# Patient Record
Sex: Female | Born: 1960
Health system: Southern US, Community
[De-identification: ages and names within clinical notes are randomized; demographics above are authoritative.]

## PROBLEM LIST (undated history)

## (undated) DIAGNOSIS — F329 Major depressive disorder, single episode, unspecified: Secondary | ICD-10-CM

## (undated) DIAGNOSIS — B182 Chronic viral hepatitis C: Secondary | ICD-10-CM

## (undated) DIAGNOSIS — I10 Essential (primary) hypertension: Secondary | ICD-10-CM

## (undated) DIAGNOSIS — D649 Anemia, unspecified: Secondary | ICD-10-CM

## (undated) DIAGNOSIS — F32A Depression, unspecified: Secondary | ICD-10-CM

## (undated) DIAGNOSIS — N938 Other specified abnormal uterine and vaginal bleeding: Secondary | ICD-10-CM

## (undated) DIAGNOSIS — IMO0001 Reserved for inherently not codable concepts without codable children: Secondary | ICD-10-CM

## (undated) DIAGNOSIS — J14 Pneumonia due to Hemophilus influenzae: Secondary | ICD-10-CM

## (undated) DIAGNOSIS — Z8489 Family history of other specified conditions: Secondary | ICD-10-CM

## (undated) DIAGNOSIS — J449 Chronic obstructive pulmonary disease, unspecified: Secondary | ICD-10-CM

## (undated) DIAGNOSIS — F419 Anxiety disorder, unspecified: Secondary | ICD-10-CM

## (undated) HISTORY — DX: Pneumonia due to hemophilus influenzae: J14

## (undated) HISTORY — PX: POLYPECTOMY: SHX149

---

## 2004-08-20 ENCOUNTER — Emergency Department (HOSPITAL_COMMUNITY): Admission: EM | Admit: 2004-08-20 | Discharge: 2004-08-20 | Payer: Self-pay

## 2005-01-08 ENCOUNTER — Ambulatory Visit (HOSPITAL_COMMUNITY): Admission: RE | Admit: 2005-01-08 | Discharge: 2005-01-08 | Payer: Self-pay | Admitting: Internal Medicine

## 2005-01-09 ENCOUNTER — Encounter: Admission: RE | Admit: 2005-01-09 | Discharge: 2005-01-09 | Payer: Self-pay | Admitting: Internal Medicine

## 2005-06-14 ENCOUNTER — Encounter (INDEPENDENT_AMBULATORY_CARE_PROVIDER_SITE_OTHER): Payer: Self-pay | Admitting: Specialist

## 2005-06-14 ENCOUNTER — Ambulatory Visit (HOSPITAL_COMMUNITY): Admission: RE | Admit: 2005-06-14 | Discharge: 2005-06-14 | Payer: Self-pay | Admitting: Obstetrics & Gynecology

## 2006-07-26 ENCOUNTER — Inpatient Hospital Stay (HOSPITAL_COMMUNITY): Admission: AD | Admit: 2006-07-26 | Discharge: 2006-07-26 | Payer: Self-pay | Admitting: Obstetrics

## 2006-10-11 ENCOUNTER — Emergency Department (HOSPITAL_COMMUNITY): Admission: EM | Admit: 2006-10-11 | Discharge: 2006-10-11 | Payer: Self-pay | Admitting: Family Medicine

## 2006-11-04 ENCOUNTER — Emergency Department (HOSPITAL_COMMUNITY): Admission: EM | Admit: 2006-11-04 | Discharge: 2006-11-04 | Payer: Self-pay | Admitting: Emergency Medicine

## 2007-01-30 ENCOUNTER — Emergency Department (HOSPITAL_COMMUNITY): Admission: EM | Admit: 2007-01-30 | Discharge: 2007-01-30 | Payer: Self-pay | Admitting: Family Medicine

## 2007-06-04 ENCOUNTER — Emergency Department (HOSPITAL_COMMUNITY): Admission: EM | Admit: 2007-06-04 | Discharge: 2007-06-04 | Payer: Self-pay | Admitting: Emergency Medicine

## 2007-09-29 ENCOUNTER — Emergency Department (HOSPITAL_COMMUNITY): Admission: EM | Admit: 2007-09-29 | Discharge: 2007-09-29 | Payer: Self-pay | Admitting: Emergency Medicine

## 2008-04-26 ENCOUNTER — Emergency Department (HOSPITAL_COMMUNITY): Admission: EM | Admit: 2008-04-26 | Discharge: 2008-04-26 | Payer: Self-pay | Admitting: Emergency Medicine

## 2008-04-30 ENCOUNTER — Emergency Department (HOSPITAL_COMMUNITY): Admission: EM | Admit: 2008-04-30 | Discharge: 2008-04-30 | Payer: Self-pay | Admitting: Emergency Medicine

## 2008-12-13 ENCOUNTER — Emergency Department (HOSPITAL_COMMUNITY): Admission: EM | Admit: 2008-12-13 | Discharge: 2008-12-13 | Payer: Self-pay | Admitting: Emergency Medicine

## 2010-10-06 ENCOUNTER — Emergency Department (HOSPITAL_COMMUNITY): Admission: EM | Admit: 2010-10-06 | Discharge: 2010-10-06 | Payer: Self-pay | Admitting: Emergency Medicine

## 2010-12-08 ENCOUNTER — Encounter: Payer: Self-pay | Admitting: Internal Medicine

## 2010-12-09 ENCOUNTER — Encounter: Payer: Self-pay | Admitting: Internal Medicine

## 2011-01-29 LAB — URINE MICROSCOPIC-ADD ON

## 2011-01-29 LAB — CBC
MCV: 91 fL (ref 78.0–100.0)
Platelets: 331 10*3/uL (ref 150–400)
RDW: 17.7 % — ABNORMAL HIGH (ref 11.5–15.5)
WBC: 6.1 10*3/uL (ref 4.0–10.5)

## 2011-01-29 LAB — POCT I-STAT, CHEM 8
BUN: 12 mg/dL (ref 6–23)
Chloride: 109 mEq/L (ref 96–112)
Creatinine, Ser: 0.8 mg/dL (ref 0.4–1.2)
Glucose, Bld: 136 mg/dL — ABNORMAL HIGH (ref 70–99)
Potassium: 3.5 mEq/L (ref 3.5–5.1)

## 2011-01-29 LAB — URINALYSIS, ROUTINE W REFLEX MICROSCOPIC
Ketones, ur: 15 mg/dL — AB
Nitrite: NEGATIVE
Urobilinogen, UA: 1 mg/dL (ref 0.0–1.0)
pH: 7 (ref 5.0–8.0)

## 2011-01-29 LAB — DIFFERENTIAL
Basophils Absolute: 0 10*3/uL (ref 0.0–0.1)
Eosinophils Relative: 0 % (ref 0–5)
Lymphocytes Relative: 11 % — ABNORMAL LOW (ref 12–46)
Neutro Abs: 5.1 10*3/uL (ref 1.7–7.7)

## 2011-04-05 NOTE — Op Note (Signed)
[name], Cynthia Martinez NO.:  [id number]   MEDICAL RECORD NO.:  [id number]          PATIENT TYPE:  AMB   LOCATION:  SDC                           FACILITY:  [facility]   PHYSICIAN:  Roseanna Rainbow, M.D.DATE OF BIRTH:  [birth year]   DATE OF PROCEDURE:  06/14/2005  DATE OF DISCHARGE:  06/14/2005                                 OPERATIVE REPORT   PREOPERATIVE DIAGNOSIS:  Abnormal uterine bleeding, rule out an endometrial  polyp versus hyperplasia.   POSTOPERATIVE DIAGNOSIS:  Abnormal uterine bleeding, rule out an endometrial  polyp versus hyperplasia.   PROCEDURE:  Dilatation and curettage, diagnostic hysteroscopy.   SURGEON:  Roseanna Rainbow, M.D.   ANESTHESIA:  Managed anesthesia care, paracervical block.   ESTIMATED BLOOD LOSS:  Minimal.   COMPLICATIONS:  None.   IV FLUIDS:  As per anesthesiology.   PROCEDURE:  The patient was taken to the operating room with an IV running.  She was then placed in the dorsal lithotomy position and prepped and draped  in usual sterile fashion. Sims retractors were placed into the patient's  vagina. The anterior lip of the cervix was infiltrated with 2 mL of 1%  lidocaine. The single-tooth tenaculum was then applied to this location. 4  mL of lidocaine were then injected at five and seven o'clock to produce a  paracervical block. The cervix was then dilated with [facility] dilators. The  hysteroscope was then introduced into the uterus and advanced to the uterine  fundus using glycine as the distending media. A survey of the intrauterine  cavity was remarkable for somewhat lush appearing endometrium on the  anterior wall. There no discrete lesions noted. The tubal ostia were  visualized bilaterally. The hysteroscope was then removed. A sharp curettage  was then performed. Uterus sounded to approximately 10 cm. The single-tooth  tenaculum was removed with minimal bleeding noted from the cervix. At the  close of the procedure  the instrument and pack counts were said to be  correct x2. The patient was taken to the PACU awake and in stable condition.       LAJ/MEDQ  D:  06/17/2005  T:  06/17/2005  Job:  [id number]

## 2011-08-15 LAB — URINE MICROSCOPIC-ADD ON

## 2011-08-15 LAB — DIFFERENTIAL
Band Neutrophils: 0
Blasts: 0
Lymphocytes Relative: 16
Myelocytes: 0
Neutrophils Relative %: 78 — ABNORMAL HIGH
Promyelocytes Absolute: 0

## 2011-08-15 LAB — CBC
MCHC: 30.3
MCV: 60.6 — ABNORMAL LOW
RDW: 27.1 — ABNORMAL HIGH

## 2011-08-15 LAB — URINALYSIS, ROUTINE W REFLEX MICROSCOPIC
Bilirubin Urine: NEGATIVE
Glucose, UA: NEGATIVE
Specific Gravity, Urine: 1.026
pH: 5.5

## 2011-08-15 LAB — TYPE AND SCREEN
ABO/RH(D): O POS
Antibody Screen: NEGATIVE

## 2011-08-15 LAB — WET PREP, GENITAL: Trich, Wet Prep: NONE SEEN

## 2011-08-15 LAB — POCT I-STAT, CHEM 8
Chloride: 108
Glucose, Bld: 141 — ABNORMAL HIGH
HCT: 29 — ABNORMAL LOW
Hemoglobin: 9.9 — ABNORMAL LOW
Potassium: 3.7
Sodium: 140

## 2011-08-15 LAB — GC/CHLAMYDIA PROBE AMP, GENITAL: GC Probe Amp, Genital: NEGATIVE

## 2011-08-15 LAB — FERRITIN: Ferritin: 3 — ABNORMAL LOW (ref 10–291)

## 2011-08-15 LAB — POCT PREGNANCY, URINE: Preg Test, Ur: NEGATIVE

## 2011-11-30 ENCOUNTER — Encounter (HOSPITAL_COMMUNITY): Payer: Self-pay | Admitting: *Deleted

## 2011-11-30 ENCOUNTER — Emergency Department (INDEPENDENT_AMBULATORY_CARE_PROVIDER_SITE_OTHER)
Admission: EM | Admit: 2011-11-30 | Discharge: 2011-11-30 | Disposition: A | Discharge status: Home / Self Care | Payer: Self-pay | Source: Home / Self Care | Attending: Family Medicine | Admitting: Family Medicine

## 2011-11-30 DIAGNOSIS — J069 Acute upper respiratory infection, unspecified: Secondary | ICD-10-CM

## 2011-11-30 MED ORDER — AZITHROMYCIN 250 MG PO TABS: ORAL_TABLET | ORAL | Status: AC

## 2011-11-30 MED ORDER — IPRATROPIUM BROMIDE 0.06 % NA SOLN: 2.0000 | Freq: Four times a day (QID) | NASAL | Status: DC

## 2011-11-30 NOTE — ED Notes (Signed)
Pt with onset of cough congestion onset x one week - cough productive

## 2011-11-30 NOTE — ED Provider Notes (Signed)
History     CSN: [id number]  Arrival date & time 11/30/11  0902   First MD Initiated Contact with Patient 11/30/11 0914      Chief Complaint  Patient presents with  . Cough  . Nasal Congestion    (Consider location/radiation/quality/duration/timing/severity/associated sxs/prior treatment) Patient is a 51 y.o. female presenting with cough. The history is provided by the patient.  Cough This is a new problem. The current episode started more than 1 week ago. The problem occurs constantly. The problem has not changed since onset.The cough is productive of sputum. Associated symptoms include ear congestion and rhinorrhea. Pertinent negatives include no sore throat. She is not a smoker.    History reviewed. No pertinent past medical history.  History reviewed. No pertinent past surgical history.  History reviewed. No pertinent family history.  History  Substance Use Topics  . Smoking status: Never Smoker   . Smokeless tobacco: Not on file  . Alcohol Use: No    OB History    Grav Para Term Preterm Abortions TAB SAB Ect Mult Living                  Review of Systems  Constitutional: Negative.   HENT: Positive for congestion, rhinorrhea and postnasal drip. Negative for sore throat.   Respiratory: Positive for cough.   Cardiovascular: Negative.   Gastrointestinal: Negative.     Allergies  Penicillins and Sulfa antibiotics  Home Medications   Current Outpatient Rx  Name Route Sig Dispense Refill  . GUAIFENESIN ER 600 MG PO TB12 Oral Take 1,200 mg by mouth 2 (two) times daily.    . AZITHROMYCIN 250 MG PO TABS  Take as directed on pack 6 each 0  . IPRATROPIUM BROMIDE 0.06 % NA SOLN Nasal Place 2 sprays into the nose 4 (four) times daily. 15 mL 12    BP 134/92  Pulse 98  Temp(Src) 97.1 F (36.2 C) (Oral)  Resp 18  SpO2 100%  Physical Exam  Nursing note and vitals reviewed. Constitutional: She is oriented to person, place, and time. She appears well-developed  and well-nourished.  HENT:  Head: Normocephalic.  Right Ear: External ear normal.  Left Ear: External ear normal.  Nose: Nose normal.  Mouth/Throat: Oropharynx is clear and moist.  Eyes: Conjunctivae are normal. Pupils are equal, round, and reactive to light.  Neck: Normal range of motion. Neck supple.  Cardiovascular: Normal rate, regular rhythm and normal heart sounds.   Pulmonary/Chest: Effort normal and breath sounds normal. No respiratory distress. She has no wheezes. She has no rales.  Lymphadenopathy:    She has no cervical adenopathy.  Neurological: She is alert and oriented to person, place, and time.  Skin: Skin is warm and dry.  Psychiatric: She has a normal mood and affect.    ED Course  Procedures (including critical care time)  Labs Reviewed - No data to display No results found.   1. URI (upper respiratory infection)       MDM         Barkley Bruns, MD 11/30/11 [phone]

## 2011-12-15 ENCOUNTER — Emergency Department (HOSPITAL_COMMUNITY): Payer: Self-pay

## 2011-12-15 ENCOUNTER — Encounter (HOSPITAL_COMMUNITY): Payer: Self-pay | Admitting: *Deleted

## 2011-12-15 ENCOUNTER — Inpatient Hospital Stay (HOSPITAL_COMMUNITY)
Admission: EM | Admit: 2011-12-15 | Discharge: 2011-12-19 | DRG: 809 | Disposition: A | Discharge status: Home / Self Care | Payer: Self-pay | Attending: Infectious Diseases | Admitting: Infectious Diseases

## 2011-12-15 DIAGNOSIS — I10 Essential (primary) hypertension: Secondary | ICD-10-CM | POA: Diagnosis present

## 2011-12-15 DIAGNOSIS — Z681 Body mass index (BMI) 19 or less, adult: Secondary | ICD-10-CM

## 2011-12-15 DIAGNOSIS — D61818 Other pancytopenia: Principal | ICD-10-CM | POA: Diagnosis present

## 2011-12-15 DIAGNOSIS — R634 Abnormal weight loss: Secondary | ICD-10-CM | POA: Diagnosis present

## 2011-12-15 DIAGNOSIS — Z21 Asymptomatic human immunodeficiency virus [HIV] infection status: Secondary | ICD-10-CM | POA: Diagnosis present

## 2011-12-15 DIAGNOSIS — Z79899 Other long term (current) drug therapy: Secondary | ICD-10-CM

## 2011-12-15 DIAGNOSIS — B1920 Unspecified viral hepatitis C without hepatic coma: Secondary | ICD-10-CM | POA: Diagnosis present

## 2011-12-15 DIAGNOSIS — E538 Deficiency of other specified B group vitamins: Secondary | ICD-10-CM | POA: Diagnosis present

## 2011-12-15 HISTORY — DX: Anemia, unspecified: D64.9

## 2011-12-15 HISTORY — DX: Other specified abnormal uterine and vaginal bleeding: N93.8

## 2011-12-15 LAB — URINALYSIS, ROUTINE W REFLEX MICROSCOPIC
Glucose, UA: NEGATIVE mg/dL
Ketones, ur: 15 mg/dL — AB
Leukocytes, UA: NEGATIVE
pH: 6 (ref 5.0–8.0)

## 2011-12-15 LAB — COMPREHENSIVE METABOLIC PANEL
ALT: 46 U/L — ABNORMAL HIGH (ref 0–35)
Albumin: 4.2 g/dL (ref 3.5–5.2)
Alkaline Phosphatase: 31 U/L — ABNORMAL LOW (ref 39–117)
BUN: 15 mg/dL (ref 6–23)
Potassium: 3.5 mEq/L (ref 3.5–5.1)
Sodium: 139 mEq/L (ref 135–145)
Total Protein: 7.1 g/dL (ref 6.0–8.3)

## 2011-12-15 LAB — TECHNOLOGIST SMEAR REVIEW

## 2011-12-15 LAB — URINE MICROSCOPIC-ADD ON

## 2011-12-15 LAB — CBC
HCT: 19.8 % — ABNORMAL LOW (ref 36.0–46.0)
Hemoglobin: 6.7 g/dL — CL (ref 12.0–15.0)
MCHC: 33.8 g/dL (ref 30.0–36.0)
RBC: 1.95 MIL/uL — ABNORMAL LOW (ref 3.87–5.11)

## 2011-12-15 LAB — SAMPLE TO BLOOD BANK

## 2011-12-15 LAB — OCCULT BLOOD, POC DEVICE: Fecal Occult Bld: NEGATIVE

## 2011-12-15 LAB — PREPARE RBC (CROSSMATCH)

## 2011-12-15 LAB — RETICULOCYTES
RBC.: 1.78 MIL/uL — ABNORMAL LOW (ref 3.87–5.11)
Retic Count, Absolute: 16 10*3/uL — ABNORMAL LOW (ref 19.0–186.0)

## 2011-12-15 MED ORDER — ACETAMINOPHEN 325 MG PO TABS
650.0000 mg | ORAL_TABLET | Freq: Four times a day (QID) | ORAL | Status: DC | PRN
  Administered 2011-12-16 – 2011-12-18 (×3): 650 mg via ORAL
  Filled 2011-12-15 (×3): qty 2

## 2011-12-15 MED ORDER — ONDANSETRON HCL 4 MG/2ML IJ SOLN: 4.0000 mg | Freq: Four times a day (QID) | INTRAMUSCULAR | Status: DC | PRN

## 2011-12-15 MED ORDER — SODIUM CHLORIDE 0.9 % IV SOLN
INTRAVENOUS | Status: DC
  Administered 2011-12-15: 75 mL via INTRAVENOUS
  Administered 2011-12-16 – 2011-12-18 (×4): via INTRAVENOUS

## 2011-12-15 MED ORDER — ALBUTEROL SULFATE HFA 108 (90 BASE) MCG/ACT IN AERS
2.0000 | INHALATION_SPRAY | Freq: Four times a day (QID) | RESPIRATORY_TRACT | Status: DC | PRN
  Filled 2011-12-15: qty 6.7

## 2011-12-15 MED ORDER — ALBUTEROL SULFATE (5 MG/ML) 0.5% IN NEBU
5.0000 mg | INHALATION_SOLUTION | Freq: Once | RESPIRATORY_TRACT | Status: AC
  Administered 2011-12-15: 5 mg via RESPIRATORY_TRACT
  Filled 2011-12-15: qty 1

## 2011-12-15 MED ORDER — ONDANSETRON HCL 4 MG PO TABS: 4.0000 mg | ORAL_TABLET | Freq: Four times a day (QID) | ORAL | Status: DC | PRN

## 2011-12-15 MED ORDER — IPRATROPIUM BROMIDE 0.02 % IN SOLN
0.5000 mg | Freq: Once | RESPIRATORY_TRACT | Status: AC
  Administered 2011-12-15: 0.5 mg via RESPIRATORY_TRACT
  Filled 2011-12-15: qty 2.5

## 2011-12-15 MED ORDER — ACETAMINOPHEN 650 MG RE SUPP: 650.0000 mg | Freq: Four times a day (QID) | RECTAL | Status: DC | PRN

## 2011-12-15 MED ORDER — GUAIFENESIN ER 600 MG PO TB12
600.0000 mg | ORAL_TABLET | Freq: Two times a day (BID) | ORAL | Status: DC | PRN
  Filled 2011-12-15: qty 1

## 2011-12-15 MED ORDER — ENOXAPARIN SODIUM 40 MG/0.4ML ~~LOC~~ SOLN
40.0000 mg | SUBCUTANEOUS | Status: DC
  Administered 2011-12-15 – 2011-12-16 (×2): 40 mg via SUBCUTANEOUS
  Filled 2011-12-15 (×3): qty 0.4

## 2011-12-15 NOTE — ED Provider Notes (Signed)
3:13 PM Spoke with outpatient clinics resident. They will come see the patient for admission for pancytopenia. Discussed this with patient and Dr. Denton Lank  who agrees with  plan to have patient admitted for further evaluation of pancytopenia  Cynthia Martinez, Cordelia Poche 12/15/11 1514

## 2011-12-15 NOTE — ED Notes (Signed)
Ice applied at 1115 to pt's right hand d/t unsuccessful IV start - area was noted to be swollen and discolored - swelling is decreasing and discoloration improving.

## 2011-12-15 NOTE — ED Notes (Signed)
Tammy Sours, RT, aware of order for HHN's.

## 2011-12-15 NOTE — Progress Notes (Signed)
Pt was admitted from ED at 1645 with cough, afebrile, malaise and anemia. A & O x 4, full code. From home with family. Hgb was 6.7 on admit. Skin intact. Independent. POC is to transfuse PRBC with pt permission. Peter Congo RN

## 2011-12-15 NOTE — ED Provider Notes (Signed)
Medical screening examination/treatment/procedure(s) were conducted as a shared visit with non-physician practitioner(s) and myself.  I personally evaluated the patient during the encounter   Suzi Roots, MD 12/15/11 [phone]

## 2011-12-15 NOTE — ED Notes (Signed)
The pt has not been feeling well for 2-3 months.  She says she has had a cough  For the same amount of time.  She has weakness.  No appetite

## 2011-12-15 NOTE — ED Notes (Signed)
MD at bedside.ADM MD to see PT

## 2011-12-15 NOTE — H&P (Signed)
Date: 12/15/2011            Patient Name:  Cynthia Martinez  MRN: [id number]  DOB: Dec 06, 1960  Age / Sex: 51 y.o., female   PCP: Sheila Oats, MD, MD           Medical Service: Internal Medicine Teaching Service           Attending Physician: Dr. Lina Sayre, MD      Chief Complaint:dyspnea on exertion,  cough  History of Present Illness: Patient is a 51 y.o. female with a PMHx of iron deficiency anemia, who presents to [facility] for evaluation of several chronic symptoms that seem to be worsening in the last few weeks. She does not have regular primary care and has only been seen in the ED for minor complaints. She presents with worsened dyspnea on exertion such that she feels short of breath walking from her car to a store. She reports that yesterday she was unable to vacuum her house due to dyspnea and fatigue. She denies any falls or dizziness through she reports having a few episodes of bumping into the wall due to instability. She and her fiance report that she has had a cough and some dyspnea for several years. She reports having a recent upper respiratory infection since after Christmas and she associates a worsening in her symptoms since this time including loss of appetite and weight loss of uncertain amount. She was treated with a course of azithromycin as an outpatient with minimal improvement in her symptoms. She reports that she has a cough productive of yellow sputum. She has been prescribed an albuterol inhaler in the past and has been using her fiance's inhaler and reports that it helps her breathing when she uses it. She also reports an audible wheeze and mild headache. She denies a history of asthma or other respiratory problems. She has taken iron pills and vitamins intermittently in the past when diagnosed with anemia but she stops it when her blood count improves. She is post-menopausal as of summer 2012 and denies any bleeding or spotting since this time. She denies any  hematochezia or melena.  Past Medical History: Past Medical History  Diagnosis Date  . Anemia   . DUB (dysfunctional uterine bleeding)    Past Surgical History: History reviewed. No pertinent past surgical history.  Family History: Family History  Problem Relation Age of Onset  . Diabetes      Social History: History   Social History  . Marital Status: Single    Spouse Name: N/A    Number of Children: N/A  . Years of Education: N/A   Occupational History  . Not on file.   Social History Main Topics  . Smoking status: Current Some Day Smoker  . Smokeless tobacco: Not on file  . Alcohol Use: 0.0 oz/week     socially  . Drug Use: No  . Sexually Active: Yes -- Female partner(s)   Other Topics Concern  . Not on file   Social History Narrative   Lives with fiancee in [place] daughter in 20's and 32'3.Worked as Advertising copywriter in past.   Current Outpatient Medications:  Medications Prior to Admission  Medication Sig Dispense Refill  . guaiFENesin (MUCINEX) 600 MG 12 hr tablet Take 1,200 mg by mouth at bedtime.       Marland Kitchen ipratropium (ATROVENT) 0.06 % nasal spray Place 2 sprays into the nose 4 (four) times daily.  15 mL  12   Allergies: Allergies  Allergen  Reactions  . Penicillins Hives  . Sulfa Antibiotics Hives    Vital Signs: Blood pressure 121/81, pulse 114, temperature 97.4 F (36.3 C), temperature source Oral, resp. rate 20, SpO2 100.00%.  Physical Exam: General: thin middle aged woman resting in bed in no apparent distress HEENT: PERRL, EOMI, mild scleral icterus, pale conjunctiva Cardiac: tachycardic rate with regular rhythm, no rubs, murmurs or gallops Pulm: clear to auscultation bilaterally, moving normal volumes of air Abd: soft, nontender, nondistended, BS present Ext: warm and well perfused, no pedal edema Neuro: alert and oriented X3, cranial nerves II-XII grossly intact  Lab results: Basic Metabolic Panel: Recent Labs  [facility] 12/15/11 1112   NA  139   K 3.5   CL 105   CO2 25   GLUCOSE 89   BUN 15   CREATININE 0.78   CALCIUM 9.6   MG --   PHOS --   Liver Function Tests: Recent Labs  [facility] 12/15/11 1112   AST 91*   ALT 46*   ALKPHOS 31*   BILITOT 1.7*   PROT 7.1   ALBUMIN 4.2   No results found for this basename: LIPASE:2,AMYLASE:2 in the last 72 hours CBC: Recent Labs  Basename 12/15/11 1112   WBC 2.7*   NEUTROABS --   HGB 6.7*   HCT 19.8*   MCV 101.5*   PLT 88*   Imaging results:  Dg Chest 2 View  12/15/2011  *RADIOLOGY REPORT*  Clinical Data: Cough  CHEST - 2 VIEW  Comparison: None  Findings: There is a scoliosis deformity affecting the thoracic spine which is convex to the right.  The heart size and mediastinal contours are within normal limits. Both lungs are clear.  IMPRESSION: No acute findings.  Original Report Authenticated By: Rosealee Albee, M.D.   Assessment & Plan:  Pt is a 51 y.o. yo female with an unknown past medical history who presents with pancytopenia along with 15 lb weight loss and and chronic cough and worsened dyspnea on exertion.  1) Pancytopenia: Unknown etiology at present. May be from viral infection vs. bone marrow disease. Vitamin B 12 and folic deficiency in differential. Mildly elevated AST and ALT with mild elevation of bilirubin.  Plan: Admit to regular bed. - Anemia panel - HIV antibody - Acute hepatitis panel - Patient refuses blood transfusions unless absolutely necessary. - Bone marrow biopsy- if issue continues without any specific diagnosis with above tests.  2) Weight loss: For past few weeks. Might be related to the diagnosis of pancytopenia.  3) Chronic cough: Patient refuses reflux symptoms. Also does not have diagnosis of asthma. Plan: Continue when necessary albuterol and guaifenesin.  4) DVT prophylaxis: Lovenox.

## 2011-12-15 NOTE — ED Provider Notes (Signed)
History     CSN: [id number]  Arrival date & time 12/15/11  0930   First MD Initiated Contact with Patient 12/15/11 1041      Chief Complaint  Patient presents with  . Cough    (Consider location/radiation/quality/duration/timing/severity/associated sxs/prior treatment) Patient is a 51 y.o. female presenting with cough. The history is provided by the patient and a relative.  Cough Pertinent negatives include no chest pain, no chills, no headaches, no shortness of breath and no eye redness.  pt notes non productive cough in past couple weeks. Hx 'bronchitis' and ?asthma. States uses family members inhaler. Denies prior admit or steroid rx related to asthma. States did have nasal congestion and scratchy throat, those symptoms have improved. Completed zpack last week.  No fever or chills. Pt also notes feeling generally weak, poor appetite, 15 lb wt loss, loss of sense of taste, in past couple months. No headache. No chest pain or discomfort. No abd pain. Having normal bms, no change in caliber stools, no rectal bleeding or melena. No vomiting. No gu c/o. Symptoms constant. Pt unaware of exacerbating or alleviating factors.   Past Medical History  Diagnosis Date  . Asthma     History reviewed. No pertinent past surgical history.  History reviewed. No pertinent family history.  History  Substance Use Topics  . Smoking status: Never Smoker   . Smokeless tobacco: Not on file  . Alcohol Use: No    OB History    Grav Para Term Preterm Abortions TAB SAB Ect Mult Living                  Review of Systems  Constitutional: Negative for fever and chills.  HENT: Negative for neck pain and neck stiffness.   Eyes: Negative for redness and visual disturbance.  Respiratory: Positive for cough. Negative for shortness of breath.   Cardiovascular: Negative for chest pain and leg swelling.  Gastrointestinal: Negative for abdominal pain.  Genitourinary: Negative for dysuria and flank pain.    Musculoskeletal: Negative for back pain.  Skin: Negative for rash.  Neurological: Negative for numbness and headaches.  Hematological: Does not bruise/bleed easily.  Psychiatric/Behavioral: Negative for confusion.    Allergies  Penicillins and Sulfa antibiotics  Home Medications   Current Outpatient Rx  Name Route Sig Dispense Refill  . ALBUTEROL SULFATE HFA 108 (90 BASE) MCG/ACT IN AERS Inhalation Inhale 2 puffs into the lungs every 6 (six) hours as needed. For wheezing    . GUAIFENESIN ER 600 MG PO TB12 Oral Take 1,200 mg by mouth at bedtime.     . IPRATROPIUM BROMIDE 0.06 % NA SOLN Nasal Place 2 sprays into the nose 4 (four) times daily. 15 mL 12    BP 124/96  Pulse 113  Temp(Src) 97.4 F (36.3 C) (Oral)  Resp 18  SpO2 100%  Physical Exam  Nursing note and vitals reviewed. Constitutional: She is oriented to person, place, and time. She appears well-developed and well-nourished. No distress.  HENT:  Head: Atraumatic.  Mouth/Throat: Oropharynx is clear and moist.  Eyes: Conjunctivae are normal. Pupils are equal, round, and reactive to light. No scleral icterus.  Neck: Neck supple. No tracheal deviation present. No thyromegaly present.  Cardiovascular: Regular rhythm, normal heart sounds and intact distal pulses.  Exam reveals no gallop and no friction rub.   No murmur heard. Pulmonary/Chest: Effort normal. No respiratory distress. She has wheezes.       Mild exp wheezing, good air exchange.   Abdominal:  Soft. Normal appearance and bowel sounds are normal. She exhibits no distension and no mass. There is no tenderness. There is no rebound and no guarding.  Musculoskeletal: She exhibits no edema and no tenderness.  Neurological: She is alert and oriented to person, place, and time.       Motor intact bil.   Skin: Skin is warm and dry. No rash noted. There is pallor.  Psychiatric: She has a normal mood and affect.    ED Course  Procedures (including critical care  time)   Labs Reviewed  COMPREHENSIVE METABOLIC PANEL  URINALYSIS, ROUTINE W REFLEX MICROSCOPIC  CBC  SAMPLE TO BLOOD BANK   Dg Chest 2 View  12/15/2011  *RADIOLOGY REPORT*  Clinical Data: Cough  CHEST - 2 VIEW  Comparison: None  Findings: There is a scoliosis deformity affecting the thoracic spine which is convex to the right.  The heart size and mediastinal contours are within normal limits. Both lungs are clear.  IMPRESSION: No acute findings.  Original Report Authenticated By: Rosealee Albee, M.D.       MDM  Reviewed prior charts and nursing notes.   Cxr, neg acute. Mild wheezing, alb and atrovent neb. Labs added.         Suzi Roots, MD 12/18/11 [phone]

## 2011-12-15 NOTE — ED Notes (Signed)
Pt aware of order for urine specimen - states is unable to void at this time.

## 2011-12-16 LAB — RETICULOCYTES
RBC.: 1.8 MIL/uL — ABNORMAL LOW (ref 3.87–5.11)
Retic Count, Absolute: 16.2 10*3/uL — ABNORMAL LOW (ref 19.0–186.0)

## 2011-12-16 LAB — DIFFERENTIAL
Basophils Relative: 0 % (ref 0–1)
Eosinophils Relative: 0 % (ref 0–5)
Lymphocytes Relative: 56 % — ABNORMAL HIGH (ref 12–46)
Monocytes Relative: 3 % (ref 3–12)
Neutrophils Relative %: 41 % — ABNORMAL LOW (ref 43–77)

## 2011-12-16 LAB — FOLATE: Folate: 13.7 ng/mL

## 2011-12-16 LAB — BASIC METABOLIC PANEL
CO2: 24 mEq/L (ref 19–32)
Calcium: 9 mg/dL (ref 8.4–10.5)
Glucose, Bld: 95 mg/dL (ref 70–99)
Potassium: 3.8 mEq/L (ref 3.5–5.1)

## 2011-12-16 LAB — IRON AND TIBC
Iron: 104 ug/dL (ref 42–135)
UIBC: 129 ug/dL (ref 125–400)

## 2011-12-16 LAB — CBC
HCT: 17.2 % — ABNORMAL LOW (ref 36.0–46.0)
MCH: 33.3 pg (ref 26.0–34.0)
MCV: 100.6 fL — ABNORMAL HIGH (ref 78.0–100.0)
Platelets: 57 10*3/uL — ABNORMAL LOW (ref 150–400)
RBC: 1.71 MIL/uL — ABNORMAL LOW (ref 3.87–5.11)

## 2011-12-16 LAB — HEPATITIS PANEL, ACUTE
HCV Ab: REACTIVE — AB
Hep A IgM: NEGATIVE
Hep B C IgM: NEGATIVE

## 2011-12-16 NOTE — Progress Notes (Signed)
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

## 2011-12-16 NOTE — Progress Notes (Signed)
Internal Medicine Teaching Service Attending Note Date: 12/16/2011  Patient name: Cynthia Martinez  Medical record number: [id number]  Date of birth: September 22, 1961   I have seen and evaluated Lerry Liner and discussed their care with the Residency Team.   Physical Exam: Blood pressure 117/79, pulse 96, temperature 98.6 F (37 C), temperature source Oral, resp. rate 17, height 5\' 3"  (1.6 m), weight 93 lb 0.6 oz (42.2 kg), SpO2 97.00%.   Lab results: Results for orders placed during the hospital encounter of 12/15/11 (from the past 24 hour(s))  HIV ANTIBODY (ROUTINE TESTING)     Status: Abnormal   Collection Time   12/15/11  5:49 PM      Component Value Range   HIV Reactive (*) NON REACTIVE   HEPATITIS PANEL, ACUTE     Status: Abnormal   Collection Time   12/15/11  5:49 PM      Component Value Range   Hepatitis B Surface Ag NEGATIVE  NEGATIVE    HCV Ab Reactive (*) NEGATIVE    Hep A IgM NEGATIVE  NEGATIVE    Hep B C IgM NEGATIVE  NEGATIVE   VITAMIN B12     Status: Abnormal   Collection Time   12/15/11  5:49 PM      Component Value Range   Vitamin B-12 104 (*) 211 - 911 (pg/mL)  FOLATE     Status: Normal   Collection Time   12/15/11  5:49 PM      Component Value Range   Folate 13.7    IRON AND TIBC     Status: Abnormal   Collection Time   12/15/11  5:49 PM      Component Value Range   Iron 104  42 - 135 (ug/dL)   TIBC 409 (*) 811 - 914 (ug/dL)   Saturation Ratios 45  20 - 55 (%)   UIBC 129  125 - 400 (ug/dL)  FERRITIN     Status: Normal   Collection Time   12/15/11  5:49 PM      Component Value Range   Ferritin 201  10 - 291 (ng/mL)  RETICULOCYTES     Status: Abnormal   Collection Time   12/15/11  5:49 PM      Component Value Range   Retic Ct Pct 0.9  0.4 - 3.1 (%)   RBC. 1.78 (*) 3.87 - 5.11 (MIL/uL)   Retic Count, Manual 16.0 (*) 19.0 - 186.0 (K/uL)  TECHNOLOGIST SMEAR REVIEW     Status: Normal   Collection Time   12/15/11  5:49 PM      Component Value Range   Path Review TEARDROP CELLS    BASIC METABOLIC PANEL     Status: Normal   Collection Time   12/16/11  6:45 AM      Component Value Range   Sodium 140  135 - 145 (mEq/L)   Potassium 3.8  3.5 - 5.1 (mEq/L)   Chloride 108  96 - 112 (mEq/L)   CO2 24  19 - 32 (mEq/L)   Glucose, Bld 95  70 - 99 (mg/dL)   BUN 10  6 - 23 (mg/dL)   Creatinine, Ser 7.82  0.50 - 1.10 (mg/dL)   Calcium 9.0  8.4 - 95.6 (mg/dL)   GFR calc non Af Amer >90  >90 (mL/min)   GFR calc Af Amer >90  >90 (mL/min)  CBC     Status: Abnormal   Collection Time   12/16/11  6:45 AM  Component Value Range   WBC 2.8 (*) 4.0 - 10.5 (K/uL)   RBC 1.71 (*) 3.87 - 5.11 (MIL/uL)   Hemoglobin 5.7 (*) 12.0 - 15.0 (g/dL)   HCT 45.4 (*) 09.8 - 46.0 (%)   MCV 100.6 (*) 78.0 - 100.0 (fL)   MCH 33.3  26.0 - 34.0 (pg)   MCHC 33.1  30.0 - 36.0 (g/dL)   RDW 11.9 (*) 14.7 - 15.5 (%)   Platelets 57 (*) 150 - 400 (K/uL)  PROTIME-INR     Status: Abnormal   Collection Time   12/16/11  6:45 AM      Component Value Range   Prothrombin Time 16.0 (*) 11.6 - 15.2 (seconds)   INR 1.25  0.00 - 1.49   APTT     Status: Normal   Collection Time   12/16/11  6:45 AM      Component Value Range   aPTT 33  24 - 37 (seconds)  DIFFERENTIAL     Status: Abnormal   Collection Time   12/16/11  9:40 AM      Component Value Range   Neutro Abs 1.0 (*) 1.7 - 7.7 (K/uL)   Lymphs Abs 1.3  0.7 - 4.0 (K/uL)   Monocytes Absolute 0.1  0.1 - 1.0 (K/uL)   Eosinophils Absolute 0.0  0.0 - 0.7 (K/uL)   Basophils Absolute 0.0  0.0 - 0.1 (K/uL)   Neutrophils Relative 41 (*) 43 - 77 (%)   Lymphocytes Relative 56 (*) 12 - 46 (%)   Monocytes Relative 3  3 - 12 (%)   Eosinophils Relative 0  0 - 5 (%)   Basophils Relative 0  0 - 1 (%)   RBC Morphology TEARDROP CELLS    LACTATE DEHYDROGENASE     Status: Abnormal   Collection Time   12/16/11  9:40 AM      Component Value Range   LD 3610 (*) 94 - 250 (U/L)  RETICULOCYTES     Status: Abnormal   Collection Time   12/16/11   9:40 AM      Component Value Range   Retic Ct Pct 0.9  0.4 - 3.1 (%)   RBC. 1.80 (*) 3.87 - 5.11 (MIL/uL)   Retic Count, Manual 16.2 (*) 19.0 - 186.0 (K/uL)    Imaging results:  Dg Chest 2 View  12/15/2011  *RADIOLOGY REPORT*  Clinical Data: Cough  CHEST - 2 VIEW  Comparison: None  Findings: There is a scoliosis deformity affecting the thoracic spine which is convex to the right.  The heart size and mediastinal contours are within normal limits. Both lungs are clear.  IMPRESSION: No acute findings.  Original Report Authenticated By: Rosealee Albee, M.D.    Assessment and Plan I agree with the formulated Assessment and Plan with the following changes: Ms. Hallmark needs to have HIV infection addressed and hepatitis viral infections assessed and serum B12 measure. If she has HIV, she may still need marrow exam. Would ask hematology consult advice for need for bone marrow. Lina Sayre

## 2011-12-16 NOTE — Progress Notes (Signed)
Blood bank called - 2 unit RBC ready. Patient wishes to wait for fiance to be present during transfusion. Will wait until he arrives. [place], [employer]

## 2011-12-17 DIAGNOSIS — D61818 Other pancytopenia: Principal | ICD-10-CM

## 2011-12-17 LAB — BASIC METABOLIC PANEL
Chloride: 106 mEq/L (ref 96–112)
Creatinine, Ser: 0.69 mg/dL (ref 0.50–1.10)
GFR calc Af Amer: >90 mL/min (ref >90)
GFR calc non Af Amer: >90 mL/min (ref >90)

## 2011-12-17 LAB — TYPE AND SCREEN
ABO/RH(D): O POS
Antibody Screen: NEGATIVE
Unit division: 0

## 2011-12-17 LAB — CBC
HCT: 28.3 % — ABNORMAL LOW (ref 36.0–46.0)
Hemoglobin: 9.6 g/dL — ABNORMAL LOW (ref 12.0–15.0)
MCHC: 33.9 g/dL (ref 30.0–36.0)
WBC: 2.3 10*3/uL — ABNORMAL LOW (ref 4.0–10.5)

## 2011-12-17 LAB — VITAMIN B12: Vitamin B-12: 76 pg/mL — ABNORMAL LOW (ref 211–911)

## 2011-12-17 MED ORDER — CYANOCOBALAMIN 1000 MCG/ML IJ SOLN
1000.0000 ug | Freq: Once | INTRAMUSCULAR | Status: AC
  Administered 2011-12-17: 1000 ug via INTRAMUSCULAR
  Filled 2011-12-17: qty 1

## 2011-12-17 MED ORDER — ENOXAPARIN SODIUM 30 MG/0.3ML ~~LOC~~ SOLN
20.0000 mg | SUBCUTANEOUS | Status: DC
  Administered 2011-12-17 – 2011-12-18 (×2): 20 mg via SUBCUTANEOUS
  Filled 2011-12-17 (×3): qty 0.2

## 2011-12-17 NOTE — Progress Notes (Signed)
Internal Medicine Teaching Service Attending Note Date: 12/17/2011  Patient name: Cynthia Martinez  Medical record number: [id number]  Date of birth: 1961-03-16    This patient has been seen and discussed with the house staff. Please see their note for complete details. I concur with their findings with the following additions/corrections:Still awaiting confirmatory WB antibodies for HIV and PCR for HCV. 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 Would give parenteral dose of B12. Pancytopenia and elevated LDH are seen with severe B12 deficiency and would ask hematology to review to be sure we are covering all the bases. If she is HCV and HIV infected it could be exacerbating her pancytopenia.   Lina Sayre 12/17/2011, 9:25 PM

## 2011-12-17 NOTE — Progress Notes (Signed)
Pharmacy - Lovenox Dose Adjustment  51 yo F started on Lovenox for VTE prophylaxis.    Wt = 42 kg Hgb = 9.6 Hct = 28.3 PLTC = 56  Will adjust Lovenox to 20mg  (0.5mg /kg) SQ Q24h based on low body weight.  However, would consider d/c Lovenox and use SCDs for VTE prophylaxis given patient's thrombocytopenia.  [employer], Pharm.D., BCPS Clinical Pharmacist Pager [phone]

## 2011-12-17 NOTE — Progress Notes (Signed)
S: patient has no complaints  O: Filed Vitals:   12/17/11 1428  BP: 138/86  Pulse: 78  Temp: 98.2 F (36.8 C)  Resp: 18  General: very thin woman resting in bed HEENT: PERRL, EOMI, no scleral icterus Cardiac: RRR, no rubs, murmurs or gallops Pulm: clear to auscultation bilaterally, moving normal volumes of air, has inspiratory wheeze diffusely Abd: soft, nontender, nondistended, BS present Ext: warm and well perfused, no pedal edema Neuro: alert and oriented X3, cranial nerves II-XII grossly intact  A/P:  Pancytopenia: -patient and her fiance were counseled that she tested positive for HIV and Hep C antibody tests -will await confirmatory tests -fiance got tested yesterday -responded well to 2 units, Hgb over 9.0 - LDH is elevated, peripheral smear shows many different morphologies -B12 was low, repeated test today  Dyspnea: likely due to anemia  Weight loss: likely due to HIV infection  Cough: of unclear cause, appears improved

## 2011-12-18 LAB — CBC
Hemoglobin: 9.8 g/dL — ABNORMAL LOW (ref 12.0–15.0)
MCHC: 33.8 g/dL (ref 30.0–36.0)
Platelets: 49 10*3/uL — ABNORMAL LOW (ref 150–400)

## 2011-12-18 MED ORDER — CYANOCOBALAMIN 1000 MCG/ML IJ SOLN
1000.0000 ug | Freq: Once | INTRAMUSCULAR | Status: AC
  Administered 2011-12-18: 1000 ug via INTRAMUSCULAR
  Filled 2011-12-18: qty 1

## 2011-12-18 NOTE — Progress Notes (Signed)
Contacted internal medicine on call MD and mentioned how the blood pressures have been trending high (150/160's over 80/90s. They will tell the MD rounding tomorrow.

## 2011-12-18 NOTE — Progress Notes (Signed)
Subjective: Patient slept well last night with no complains.  She continues to have a low appetite and some residual cough from her recent URI. She denied CP, SOB, N/V, diarrhea, and constipation.   Objective: Vital signs in last 24 hours: Filed Vitals:   12/17/11 1428 12/17/11 2113 12/18/11 0513 12/18/11 0518  BP: 138/86 155/82 162/93 153/90  Pulse: 78 67 70   Temp: 98.2 F (36.8 C) 98.1 F (36.7 C) 97.5 F (36.4 C)   TempSrc:  Oral Oral   Resp: 18 20 20    Height:      Weight:      SpO2: 94% 99% 99%    Weight change:   Intake/Output Summary (Last 24 hours) at 12/18/11 0949 Last data filed at 12/17/11 1900  Gross per 24 hour  Intake   1545 ml  Output      0 ml  Net   1545 ml   Physical Exam:  General: resting in bed, not in acute distress HEENT: PERRL, EOMI, no scleral icterus Cardiac: S1/S2, RRR, No murmurs, gallops or rubs Pulm: left lung: Inspiratory and expiratory wheezing. Right lung: good air movement, clear to auscultation, no rales, wheezing, rhonchi or rubs. Abd: Soft,  nondistended, nontender, no rebound pain, no organomegaly, BS present Ext: No rashes or edema, 2+DP/PT pulse bilaterally Neuro: alert and oriented X3, cranial nerves II-XII grossly intact.  Lab Results: -Repeat B12: 76  -HCV quant: 36002 (nl <43);  -HCV quant log: 4.56 (nl <1.63).  -HIV Western Blot: pending  -Intrinsic factor ab: pending  Micro Results: Recent Results (from the past 240 hour(s))  TECHNOLOGIST SMEAR REVIEW     Status: Normal   Collection Time   12/15/11  5:49 PM      Component Value Range Status Comment   Path Review TEARDROP CELLS   Final    Medications: I have reviewed the patient's current medications. Scheduled Meds:   . cyanocobalamin  1,000 mcg Intramuscular Once  . enoxaparin  20 mg Subcutaneous Q24H  . DISCONTD: enoxaparin  40 mg Subcutaneous Q24H   Continuous Infusions:   . sodium chloride 75 mL/hr at 12/18/11 0459   PRN Meds:.acetaminophen,  acetaminophen, albuterol, guaiFENesin, ondansetron (ZOFRAN) IV, ondansetron  Assessment/Plan:  *Pancytopenia: this is likely caused by a combination of HIV infection and B12 deficiency. LDH is elevated and peripheral smear shows many different morphologies including tear drop cells.  Patient responded well to 2 units of RBCs transfusion on 1/28.  HGB has been stable over 9.5.  Patient and her fiance were counseled that she tested positive for HIV and Hep C antibody tests. Her fiance is awaiting his results.  Patient's HCV quant came back positive on 1/30, making HIV infection even more likely.    -HIV WB result pending, will refer to ID if it is positive.  -Hep C will be discussed with patient.  *B12 deficiency: patient's B12 level has been severely low, 106 and 76 on two sets of labs.  Pernicious anemia is highly likely.  Per hematology consult, further workup was recommended in order to confirm the diagnosis.  B12 1000 mcg injection was initiated on 1/29.   - Continue B12 injections, daily for 7 days total then once a week.  If patient is diagnosed with pernicious anemia, she is likely to be on B12 for life.  -Intrinsic factor ab ordered on 1/30 to assess pernicious anemia. Result pending.  *Dyspnea: likely due to anemia or recent URI.  Patient's has been using albuterol inhaler, which had  provided relief.   *Weight loss: likely due to HIV infection.  *Cough: likely residual from recent URI, this has improved greatly per patient.      LOS: 3 days   Maren Beach, MS3 12/18/2011, 9:49 AMTimothy Maurice March

## 2011-12-18 NOTE — Progress Notes (Addendum)
S: patient has no complaints  O: Filed Vitals:   12/18/11 1350  BP: 154/96  Pulse: 74  Temp: 98.3 F (36.8 C)  Resp: 20  General: very thin woman resting in bed HEENT: PERRL, EOMI, no scleral icterus Cardiac: RRR, no rubs, murmurs or gallops Pulm: moving normal volumes of air, has inspiratory wheeze improved Abd: soft, nontender, nondistended, BS present Ext: warm and well perfused, no pedal edema Neuro: alert and oriented X3, cranial nerves II-XII grossly intact  A/P:  Pancytopenia: -patient and her fiance were counseled that she tested positive for HIV and Hep C antibody tests -discussed diagnosis of hepatitis C and what treatment would involve -fiance got tested as well -responded well to 2 units, Hgb over 9.0 - LDH is elevated, peripheral smear shows many different morphologies -B12 was low, repeated test today  Dyspnea: improved, likely due to anemia  Weight loss: likely due to HIV infection  Cough: of unclear cause, appears improved  HTN: will consider starting agent based on interactions with HIV regimen  B12 deficiency: need to arrange for home b12 shots for 4 days Feb1st-4th

## 2011-12-19 ENCOUNTER — Emergency Department (HOSPITAL_COMMUNITY)
Admission: EM | Admit: 2011-12-19 | Discharge: 2011-12-19 | Disposition: A | Discharge status: Home / Self Care | Payer: Self-pay | Attending: Emergency Medicine | Admitting: Emergency Medicine

## 2011-12-19 ENCOUNTER — Encounter (HOSPITAL_COMMUNITY): Payer: Self-pay | Admitting: Emergency Medicine

## 2011-12-19 DIAGNOSIS — Z79899 Other long term (current) drug therapy: Secondary | ICD-10-CM | POA: Insufficient documentation

## 2011-12-19 DIAGNOSIS — I10 Essential (primary) hypertension: Secondary | ICD-10-CM | POA: Insufficient documentation

## 2011-12-19 HISTORY — DX: Essential (primary) hypertension: I10

## 2011-12-19 LAB — LIPID PANEL
HDL: 40 mg/dL (ref >39)
Total CHOL/HDL Ratio: 4.1 RATIO
VLDL: 25 mg/dL (ref 0–40)

## 2011-12-19 LAB — CBC
MCH: 31 pg (ref 26.0–34.0)
MCHC: 33.6 g/dL (ref 30.0–36.0)
MCV: 92.3 fL (ref 78.0–100.0)
Platelets: 45 10*3/uL — ABNORMAL LOW (ref 150–400)
RBC: 3.13 MIL/uL — ABNORMAL LOW (ref 3.87–5.11)
RDW: 29.5 % — ABNORMAL HIGH (ref 11.5–15.5)

## 2011-12-19 LAB — BASIC METABOLIC PANEL
Calcium: 9.7 mg/dL (ref 8.4–10.5)
Creatinine, Ser: 0.66 mg/dL (ref 0.50–1.10)
GFR calc non Af Amer: >90 mL/min (ref >90)
Sodium: 141 mEq/L (ref 135–145)

## 2011-12-19 LAB — RPR: RPR Ser Ql: NONREACTIVE

## 2011-12-19 MED ORDER — ALBUTEROL SULFATE HFA 108 (90 BASE) MCG/ACT IN AERS: 2.0000 | INHALATION_SPRAY | Freq: Four times a day (QID) | RESPIRATORY_TRACT | Status: DC | PRN

## 2011-12-19 MED ORDER — HYDROCHLOROTHIAZIDE 12.5 MG PO CAPS: 12.5000 mg | ORAL_CAPSULE | Freq: Every day | ORAL | Status: DC

## 2011-12-19 MED ORDER — CYANOCOBALAMIN 1000 MCG/ML IJ SOLN: INTRAMUSCULAR | Status: DC

## 2011-12-19 MED ORDER — CYANOCOBALAMIN 1000 MCG/ML IJ SOLN: 1000.0000 ug | Freq: Once | INTRAMUSCULAR | Status: DC

## 2011-12-19 MED ORDER — LISINOPRIL 10 MG PO TABS
10.0000 mg | ORAL_TABLET | ORAL | Status: AC
  Administered 2011-12-19: 10 mg via ORAL
  Filled 2011-12-19: qty 1

## 2011-12-19 MED ORDER — CYANOCOBALAMIN 1000 MCG/ML IJ SOLN
1000.0000 ug | Freq: Once | INTRAMUSCULAR | Status: AC
  Administered 2011-12-19: 1000 ug via INTRAMUSCULAR
  Filled 2011-12-19: qty 1

## 2011-12-19 MED ORDER — LISINOPRIL 10 MG PO TABS: 10.0000 mg | ORAL_TABLET | Freq: Every day | ORAL | Status: DC

## 2011-12-19 MED ORDER — POTASSIUM CHLORIDE CRYS ER 20 MEQ PO TBCR
40.0000 meq | EXTENDED_RELEASE_TABLET | Freq: Once | ORAL | Status: AC
  Administered 2011-12-19: 40 meq via ORAL
  Filled 2011-12-19: qty 2

## 2011-12-19 NOTE — Discharge Summary (Signed)
Internal Medicine [facility] Discharge Note  Name: Cynthia Martinez MRN: [id number] DOB: [birth year] 51 y.o.  Date of Admission: 12/15/2011  9:33 AM Date of Discharge: 12/24/2011 Attending Physician: No att. 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      cyanocobalamin 1000 MCG/ML injection   Commonly known as: (VITAMIN B-12)   Inject 1ml into the muscle once a day for 4 days and then once a week for 4 weeks.      guaiFENesin 600 MG 12 hr tablet   Commonly known as: MUCINEX   Take 1,200 mg by mouth at bedtime.      ipratropium 0.06 % nasal spray   Commonly known as: ATROVENT   Place 2 sprays into the nose 4 (four) times daily.            Disposition and follow-up:   [name] was discharged from [facility] in Good condition.    Follow-up Appointments: Follow-up Information    Follow up with [facility], MD on 12/30/2011. (Appt made for Feb 11th at 2:15)       Follow up with [facility]. (Appt made for 2/7 at 9am)         Discharge Orders    Future Appointments: Provider: Department: Dept Phone: Center:   12/26/2011 9:15 AM Cliffton Asters, MD [facility] [phone] [facility]   12/30/2011 2:15 PM Bethel Born, MD [facility] [phone] [facility]     Future Orders Please Complete By Expires   Diet - low sodium heart healthy      Increase  activity slowly      Discharge instructions      Comments:   We look forward to seeing you in outpatient clinic and in the infectious disease clinic.   Call MD for:  temperature >100.4      Call MD for:      Comments:   Difficulty breathing, chest pain, dizziness, bruising, or bleeding. If you develop rash or reaction to new blood pressure medicine please stop it and call the clinic at [phone].      Consultations:  none  Procedures Performed:  Dg Chest 2 View  12/15/2011  *RADIOLOGY REPORT*  Clinical Data: Cough  CHEST - 2 VIEW  Comparison: None  Findings: There is a scoliosis deformity affecting the thoracic spine which is convex to the right.  The heart size and mediastinal contours are within normal limits. Both lungs are clear.  IMPRESSION: No acute findings.  Original Report Authenticated By: Rosealee Albee, M.D.    Admission HPI:   Patient is a 51 y.o. female with a PMHx of iron deficiency anemia, who presents to [facility] for evaluation of several chronic symptoms that seem to be worsening in the last few weeks. She does not have regular primary care and has only been seen in the ED for  minor complaints. She presents with worsened dyspnea on exertion such that she feels short of breath walking from her car to a store. She reports that yesterday she was unable to vacuum her house due to dyspnea and fatigue. She denies any falls or dizziness through she reports having a few episodes of bumping into the wall due to instability. She and her fiance report that she has had a cough and some dyspnea for several years. She reports having a recent upper respiratory infection since after Christmas and she associates a worsening in her symptoms since this time including loss of appetite and weight loss of uncertain amount. She was treated with a course of azithromycin as an outpatient with minimal improvement in her symptoms. She reports that she has a cough productive of yellow sputum. She has been  prescribed an albuterol inhaler in the past and has been using her fiance's inhaler and reports that it helps her breathing when she uses it. She also reports an audible wheeze and mild headache. She denies a history of asthma or other respiratory problems. She has taken iron pills and vitamins intermittently in the past when diagnosed with anemia but she stops it when her blood count improves. She is post-menopausal as of summer 2012 and denies any bleeding or spotting since this time. She denies any hematochezia or melena.  Hospital Course by problem list:  *Pancytopenia:  Patient presented to the ED with HGB 6.7, WBC 2.7, and platelets 88.  Differential diagnoses considered included viral infection such as HIV and hepatitis, bone marrow disease, Vit B12 deficiency, and folate deficiency.  She tested positive for HIV ab test, HCV ab test, and HCV RNA quant (36002, log 4.56).  Her pancytopenia is likely caused by B12 deficiency with possible contribution due to HIV. LDH is elevated and peripheral smear shows many different morphologies including tear drop cells. Patient responded well to 2 units of RBCs transfusion on 1/28. HGB has been stable over 9.5. Patient and her fiance were counseled that she tested positive for HIV and Hep C antibody tests. Her fiance is awaiting his results. HIV western blot and RNA quant results were pending at the time of discharge but are both negative. Hep C treatment options was discussed with patient.  In addition, CD4 count, RPR, and quantiferon gold were ordered.  Patient will be followed up by ID and IM at f/u appointments scheduled for her.   *B12 deficiency:  Patient's B12 level has been severely low, 106 and 76 on two sets of labs. Pernicious anemia is highly likely. Per hematology consult, intrinsic factor ab was ordered to confirm the diagnosis. B12 1000 mcg injection was initiated on 1/29.  Patient will receive four more daily injections as prescribed after  discharge.  B12 will then be given once a week for 4 weeks after her f/u appointment with IM.  At discharge, patient has already seen improvements in appetite and fatigue upon receiving B12 for 3 days.   *HTN:  Patient BP has been 140~180s/80~90s on second day of admisison. Home BP were 110s/80s per patient who has a home BP cuff for her fiance's known HTN. HTN could be due to a combination of stress and recent increase in salty food intake since appetite has improved. HCTZ 12.5mg  daily will be prescribed to patient upon discharge. HTN will be f/u by outpatient appointment on Feb. 11.   *Dyspnea:  This is likely due to anemia or recent URI. Patient's has been using albuterol inhaler, which had  provided relief. She will have a prescription of albuterol to take home.   *Weight loss:  This is likely due to reduction in appetite and PO intake due to B-12 deficiency.  *Cough:  This is likely residual from recent URI, this has improved greatly per patient.    Discharge Vitals:  BP 163/99  Pulse 91  Temp(Src) 98.2 F (36.8 C) (Oral)  Resp 20  Ht 5\' 3"  (1.6 m)  Wt 93 lb 0.6 oz (42.2 kg)  BMI 16.48 kg/m2  SpO2 96%  Discharge Labs:  No results found for this or any previous visit (from the past 24 hour(s)).  SignedMargorie John 12/24/2011, 4:30 PM

## 2011-12-19 NOTE — ED Notes (Signed)
PT. REPORTS ELEVATED BLOOD PRESSURE THIS EVENING AT HOME= 177/97 , STATES DISCHARGE FROM HOSPITAL TODAY PRESCRIBED WITH HCTZ BUT DID NOT TAKE MEDICATIONS DUE TO ALLERGY.  DENIES PAIN OR DISCOMFORT AT ARRIVAL.

## 2011-12-19 NOTE — Progress Notes (Signed)
Subjective: Patient slept well last night with no complains. Her appetite has improved significantly yesterday and she states that she feels great after receiving B12.  Her cough has improved and she denied chest pain, SOB, N/V, diarrhea, and constipation.      Objective: Vital signs in last 24 hours: Filed Vitals:   12/18/11 0518 12/18/11 1350 12/18/11 2131 12/19/11 0608  BP: 153/90 154/96 146/87 177/97  Pulse:  74 69 79  Temp:  98.3 F (36.8 C) 98.2 F (36.8 C) 98.3 F (36.8 C)  TempSrc:  Oral Oral Oral  Resp:  20 20 18   Height:      Weight:      SpO2:  90% 94% 96%   Weight change:   Intake/Output Summary (Last 24 hours) at 12/19/11 0850 Last data filed at 12/18/11 1300  Gross per 24 hour  Intake    240 ml  Output      0 ml  Net    240 ml   Physical Exam:  General: resting in bed, not in acute distress HEENT: PERRL, EOMI, no scleral icterus Cardiac: S1/S2, RRR, No murmurs, gallops or rubs Pulm: left lung: Inspiratory wheezing, improved from yesterday. Right lung: good air movement, clear to auscultation, no rales, wheezing, rhonchi or rubs. Abd: Soft,  nondistended, nontender, no rebound pain, no organomegaly, BS present Ext: No rashes or edema, 2+DP/PT pulse bilaterally Neuro: alert and oriented X3, cranial nerves II-XII grossly intact.  Lab Results:  -HIV Western Blot: pending -HIV RNA quant: pending  -Intrinsic factor ab: pending  Micro Results: Recent Results (from the past 240 hour(s))  TECHNOLOGIST SMEAR REVIEW     Status: Normal   Collection Time   12/15/11  5:49 PM      Component Value Range Status Comment   Path Review TEARDROP CELLS   Final    Medications: I have reviewed the patient's current medications. Scheduled Meds:    . cyanocobalamin  1,000 mcg Intramuscular Once  . potassium chloride  40 mEq Oral Once  . DISCONTD: enoxaparin  20 mg Subcutaneous Q24H   Continuous Infusions:    . DISCONTD: sodium chloride 75 mL/hr at 12/18/11 0459    PRN Meds:.acetaminophen, acetaminophen, albuterol, guaiFENesin, ondansetron (ZOFRAN) IV, ondansetron  Assessment/Plan:  Patient will be discharged today. Patient has follow up appointment with IM and ID.  Management for her primary problems is shown below:   *Pancytopenia: this is likely caused by a combination of HIV infection and B12 deficiency. LDH is elevated and peripheral smear shows many different morphologies including tear drop cells.  Patient responded well to 2 units of RBCs transfusion on 1/28.  HGB has been stable over 9.5.  Patient and her fiance were counseled that she tested positive for HIV and Hep C antibody tests. Her fiance is awaiting his results.  Patient's HCV quant came back positive on 1/30, making HIV infection even more likely.    -HIV WB and RNA quant results are pending, will refer to ID if it is positive.  -Hep C treatment options was discussed with patient today. -RPR and quantiferon gold were ordered to screen for syphilis and TB.  *B12 deficiency: patient's B12 level has been severely low, 106 and 76 on two sets of labs.  Pernicious anemia is highly likely.  Per hematology consult, further workup was recommended in order to confirm the diagnosis.  B12 1000 mcg injection was initiated on 1/29.   - Continue B12 injections, daily for 7 days total then once a week.  If patient is diagnosed with pernicious anemia, she is likely to be on B12 for life.  -Intrinsic factor ab ordered on 1/30 to assess pernicious anemia. Result pending. -Outpatient post-hospital appointment was made for patient on Feb. 11th at 2:15pm.   *HTN: patient BP has been 140~180s/80~90s a day after her admission.  Home BP were 110s/80s per patient.  HTN could be a combination of stress and recent increase in salty food intake.  HCTZ 12.5mg  daily will be prescribed to patient upon discharge.  HTN will be f/u by outpatient appointment on Feb. 11.    *Dyspnea: likely due to anemia or recent URI.   Patient's has been using albuterol inhaler, which had provided relief.  She will have a prescription of albuterol to take home. *Weight loss: likely due to HIV infection and loss of sense of taste from B12 deficiency.  *Cough: likely residual from recent URI, this has improved greatly per patient.     LOS: 4 days   Maren Beach, MS3 12/19/2011, 8:50 AM  Resident Addendum to Medical Student Note   I have seen and examined the patient, and agree with the the medical student assessment and plan outlined above. Please see my brief note below for additional details.  HPI: Patient is accepting of likely HIV diagnosis and is ready to follow up regularly and take medicines religiously to control her disease. Will likely hold off on Hepatitis C treatment until her HIV regimen is underway and may be worth holding of until newer treatment regimens are on market in the next few years.  OBJECTIVE: VS: Reviewed  Meds: Reviewed  Labs: Reviewed  Imaging: Reviewed   Physical Exam: General: Vital signs reviewed and noted. Well-developed, well-nourished, in no acute distress; alert, appropriate and cooperative throughout examination.  Lungs:  Normal respiratory effort. Trace inspiratory wheeze.  Heart: RRR. S1 and S2 normal without gallop, murmur, or rubs.  Abdomen:  BS normoactive. Soft, Nondistended, non-tender.  No masses or organomegaly.  Extremities: No pretibial edema.     ASSESSMENT/ PLAN: Will discharge to home with close follow up with outpatient clinic and [facility].   Length of Stay: 4  Margorie John, M.D. [facility], Internal Medicine Resident 12/19/2011, 2:08 PM

## 2011-12-19 NOTE — Progress Notes (Signed)
MD notified Pt bp 177/97 HR 97 no new orders rounding MD to consider treatment, Gay Filler

## 2011-12-19 NOTE — Progress Notes (Signed)
Cynthia Martinez to be D/C'd Home per MD order.  Discussed with the patient and all questions fully answered.   Tal, Neer D  Home Medication Instructions [id number]   Printed on:12/19/11 1623  Medication Information                    guaiFENesin (MUCINEX) 600 MG 12 hr tablet Take 1,200 mg by mouth at bedtime.            ipratropium (ATROVENT) 0.06 % nasal spray Place 2 sprays into the nose 4 (four) times daily.           albuterol (PROVENTIL HFA;VENTOLIN HFA) 108 (90 BASE) MCG/ACT inhaler Inhale 2 puffs into the lungs every 6 (six) hours as needed. For wheezing           albuterol (PROVENTIL HFA;VENTOLIN HFA) 108 (90 BASE) MCG/ACT inhaler Inhale 2 puffs into the lungs every 6 (six) hours as needed for wheezing or shortness of breath.           cyanocobalamin (,VITAMIN B-12,) 1000 MCG/ML injection Inject 1 mL (1,000 mcg total) into the muscle once.           hydrochlorothiazide (MICROZIDE) 12.5 MG capsule Take 1 capsule (12.5 mg total) by mouth daily.             VVS, Skin clean, dry and intact without evidence of skin break down, no evidence of skin tears noted. IV catheter discontinued intact. Site without signs and symptoms of complications. Dressing and pressure applied.  An After Visit Summary was printed and given to the patient. Patient escorted via WC, and D/C home via private auto.  Driggers, Rae Roam 12/19/2011 4:23 PM

## 2011-12-19 NOTE — ED Provider Notes (Signed)
History     CSN: [id number]  Arrival date & time 12/19/11  2134   First MD Initiated Contact with Patient 12/19/11 2245      Chief Complaint  Patient presents with  . Hypertension    (Consider location/radiation/quality/duration/timing/severity/associated sxs/prior treatment) HPI Comments: Patient states she was just discharged from the hospital today and given a prescription for HCTZ for newly diagnosed HTN, states that she went to the pharmacy and because of her sulfa allergy she could not get the blood pressure medication - states blood pressure today there was 177/97 so she was told to return here for new blood pressure medication.  She denies any concerning symptoms like headache, chest pain, shortness of breath, dizziness, nausea, vomiting, abdominal pain.  Patient is a 51 y.o. female presenting with hypertension. The history is provided by the patient. No language interpreter was used.  Hypertension This is a new problem. The current episode started today. The problem occurs constantly. The problem has been unchanged. Pertinent negatives include no abdominal pain, anorexia, arthralgias, chest pain, chills, congestion, coughing, diaphoresis, fatigue, fever, headaches, myalgias, nausea, neck pain, numbness, sore throat, swollen glands, vertigo, visual change, vomiting or weakness. The symptoms are aggravated by nothing. She has tried nothing for the symptoms. The treatment provided no relief.    Past Medical History  Diagnosis Date  . Anemia   . DUB (dysfunctional uterine bleeding)   . Hypertension     History reviewed. No pertinent past surgical history.  Family History  Problem Relation Age of Onset  . Diabetes      History  Substance Use Topics  . Smoking status: Current Some Day Smoker  . Smokeless tobacco: Not on file  . Alcohol Use: 0.0 oz/week     socially    OB History    Grav Para Term Preterm Abortions TAB SAB Ect Mult Living                  Review of  Systems  Constitutional: Negative for fever, chills, diaphoresis and fatigue.  HENT: Negative for congestion, sore throat and neck pain.   Respiratory: Negative for cough.   Cardiovascular: Negative for chest pain.  Gastrointestinal: Negative for nausea, vomiting, abdominal pain and anorexia.  Musculoskeletal: Negative for myalgias and arthralgias.  Neurological: Negative for vertigo, weakness, numbness and headaches.  All other systems reviewed and are negative.    Allergies  Hydrochlorothiazide; Penicillins; and Sulfa antibiotics  Home Medications   Current Outpatient Rx  Name Route Sig Dispense Refill  . ALBUTEROL SULFATE HFA 108 (90 BASE) MCG/ACT IN AERS Inhalation Inhale 2 puffs into the lungs every 6 (six) hours as needed for wheezing or shortness of breath. 2 Inhaler 0  . CYANOCOBALAMIN 1000 MCG/ML IJ SOLN  Inject 1ml into the muscle once a day for 4 days and then once a week for 4 weeks. 8 mL 0  . GUAIFENESIN ER 600 MG PO TB12 Oral Take 1,200 mg by mouth at bedtime.     . IPRATROPIUM BROMIDE 0.06 % NA SOLN Nasal Place 2 sprays into the nose 4 (four) times daily. 15 mL 12    BP 166/97  Pulse 89  Temp(Src) 97.8 F (36.6 C) (Oral)  SpO2 99%  Physical Exam  Nursing note and vitals reviewed. Constitutional: She is oriented to person, place, and time. She appears well-developed and well-nourished. No distress.       hypertensive  HENT:  Head: Normocephalic and atraumatic.  Right Ear: External ear normal.  Left  Ear: External ear normal.  Nose: Nose normal.  Mouth/Throat: Oropharynx is clear and moist. No oropharyngeal exudate.  Eyes: Conjunctivae are normal. Pupils are equal, round, and reactive to light. No scleral icterus.  Neck: Normal range of motion. Neck supple.  Cardiovascular: Normal rate, regular rhythm and normal heart sounds.  Exam reveals no gallop and no friction rub.   No murmur heard. Pulmonary/Chest: Effort normal and breath sounds normal. No respiratory  distress. She exhibits no tenderness.  Abdominal: Soft. Bowel sounds are normal. She exhibits no distension. There is no tenderness.  Musculoskeletal: Normal range of motion.  Lymphadenopathy:    She has no cervical adenopathy.  Neurological: She is alert and oriented to person, place, and time. No cranial nerve deficit.  Skin: Skin is warm and dry. No rash noted. No erythema. No pallor.  Psychiatric: She has a normal mood and affect. Her behavior is normal. Judgment and thought content normal.    ED Course  Procedures (including critical care time)  Labs Reviewed - No data to display No results found.   Hypertension   MDM  Patient with newly diagnosed HTN who was prescribed a medication that she is allergic to, she has no concerning symptoms that would warrant further work up at this time so I will change her to Lisinopril.  I have given her the first dose tonight and she can get the medication filled tomorrow.        Izola Price [place], [place] 12/19/11 2257

## 2011-12-20 ENCOUNTER — Encounter: Payer: Self-pay | Admitting: Ophthalmology

## 2011-12-20 DIAGNOSIS — B1920 Unspecified viral hepatitis C without hepatic coma: Secondary | ICD-10-CM | POA: Insufficient documentation

## 2011-12-20 DIAGNOSIS — D510 Vitamin B12 deficiency anemia due to intrinsic factor deficiency: Secondary | ICD-10-CM | POA: Insufficient documentation

## 2011-12-20 DIAGNOSIS — I10 Essential (primary) hypertension: Secondary | ICD-10-CM | POA: Insufficient documentation

## 2011-12-20 LAB — INTRINSIC FACTOR ANTIBODIES: Intrinsic Factor: POSITIVE

## 2011-12-20 LAB — HEMOGLOBIN A1C
Hgb A1c MFr Bld: 5.8 % — ABNORMAL HIGH (ref <5.7)
Mean Plasma Glucose: 120 mg/dL — ABNORMAL HIGH (ref <117)

## 2011-12-20 LAB — HIV-1 RNA QUANT-NO REFLEX-BLD: HIV 1 RNA Quant: <20 copies/mL (ref <20)

## 2011-12-20 NOTE — ED Provider Notes (Signed)
Medical screening examination/treatment/procedure(s) were performed by non-physician practitioner and as supervising physician I was immediately available for consultation/collaboration.  Jasmine Awe, MD 12/20/11 [phone]

## 2011-12-24 ENCOUNTER — Telehealth: Payer: Self-pay

## 2011-12-24 LAB — QUANTIFERON TB GOLD ASSAY (BLOOD): Interferon Gamma Release Assay: NEGATIVE

## 2011-12-24 NOTE — Telephone Encounter (Signed)
12-22-10  Pt was advised there was no need for intake with me on 12-25-10.  She will be seeing Dr Orvan Falconer instead. She stated someone had called her earlier about lab results to inform her of additional diagnosis of Hep. C.  I explained Dr Orvan Falconer will discuss lab results with her on Thursday @ 9:15 am.    Laurell Josephs, RN , BSN

## 2011-12-26 ENCOUNTER — Ambulatory Visit: Payer: Self-pay

## 2011-12-26 ENCOUNTER — Ambulatory Visit (INDEPENDENT_AMBULATORY_CARE_PROVIDER_SITE_OTHER): Payer: Self-pay | Admitting: Internal Medicine

## 2011-12-26 ENCOUNTER — Encounter: Payer: Self-pay | Admitting: Internal Medicine

## 2011-12-26 DIAGNOSIS — D61818 Other pancytopenia: Secondary | ICD-10-CM | POA: Insufficient documentation

## 2011-12-26 NOTE — Progress Notes (Signed)
Patient ID: Cynthia Martinez, female   DOB: 07-30-1961, 51 y.o.   MRN: [id number]  INFECTIOUS DISEASE PROGRESS NOTE    Patient Active Problem List  Diagnoses  . Hepatitis C  . Pernicious anemia  . Hypertension  . Pancytopenia     Subjective: Cynthia Martinez is in for a hospital followup visit. She was seen by my partner, Dr. Darlina Sicilian, which she was hospitalized recently with dyspnea and pancytopenia. She was found to have hepatitis C with reactive antibody and a positive viral load. Her HIV EIA test was also positive and she was discharged with the Western blot and viral load pending.  She was previously married to a man that she describes as an alcoholic and who spent a lot of time on the street. She does not know if he ever had hepatitis, HIV or other six-week transmitted diseases. She is currently engaged and her current partner is in the process of being tested.  She denies ever using street drugs. She has never had a blood transfusion prior to her recent admission. Rarely drinks alcohol and never to excess.  Objective: Temp: 97.9 F (36.6 C) (02/07 0858) Temp src: Oral (02/07 0858) BP: 181/111 mmHg (02/07 0858) Pulse Rate: 89  (02/07 0858)   General: She is healthy appearing and in no distress Skin: No rash Lungs: Clear Cor: Regular S1 and S2 Abdomen: Soft and nontender. I do not palpate a liver or spleen.   Lab Results Lab Results  Component Value Date   WBC 2.5* 12/19/2011   HGB 9.7* 12/19/2011   HCT 28.9* 12/19/2011   MCV 92.3 12/19/2011   PLT 45* 12/19/2011    Lab Results  Component Value Date   CREATININE 0.66 12/19/2011   BUN 7 12/19/2011   NA 141 12/19/2011   K 3.5 12/19/2011   CL 106 12/19/2011   CO2 24 12/19/2011    Lab Results  Component Value Date   ALT 46* 12/15/2011   AST 91* 12/15/2011   ALKPHOS 31* 12/15/2011   BILITOT 1.7* 12/15/2011       HIV 1 RNA Quant (copies/mL)  Date Value  12/18/2011 <20      CD4 T Cell Abs (cmm)  Date Value  12/19/2011 740    Reactive (01/27 1749)   Assessment: Fortunately her HIV serology was falsely positive. Her Western blot and viral load were negative so this needs no further evaluation or treatment. She does have hepatitis C with elevated liver enzymes and viral load. This is probably contributing to her pancytopenia. She is interested in a referral to [facility] in [place]. She needs to be screened for previous hepatitis a with total hep A antibody. She is reluctant to get any blood drawn today and would prefer to wait until she seen in the internal medicine clinic early next week. If her hepatitis A antibody is negative she should start the hepatitis A and hepatitis B vaccine series.  Plan: 1. Recommend total hepatitis A antibody testing next week when she is seen in the internal medicine clinic. 2. Will need hepatitis B vaccination and a hepatitis A vaccine as well with the antibody is negative. 3. We will try to arrange a referral to the hepatitis C clinic at [facility] in [place].   Cliffton Asters, MD [facility] for Infectious Diseases [facility] [phone] pager   [phone] cell 12/26/2011, 9:23 AM

## 2011-12-27 ENCOUNTER — Other Ambulatory Visit: Payer: Self-pay | Admitting: *Deleted

## 2011-12-27 ENCOUNTER — Telehealth: Payer: Self-pay | Admitting: *Deleted

## 2011-12-27 NOTE — Telephone Encounter (Signed)
Refill canceled; has not been seen here before; pt made awared.

## 2011-12-27 NOTE — Telephone Encounter (Signed)
She is anxious to get the appt with the hep C clinic . friday pms are best. I told her I will forward to [facility] who will be setting this up. She will call her when she has the appt  She also wants Dr. Orvan Falconer to call her husband after 3:30. Use [id number]. I told her I will send this to the md

## 2011-12-30 ENCOUNTER — Encounter: Payer: Self-pay | Admitting: Internal Medicine

## 2011-12-30 ENCOUNTER — Ambulatory Visit (INDEPENDENT_AMBULATORY_CARE_PROVIDER_SITE_OTHER): Payer: Self-pay | Admitting: Internal Medicine

## 2011-12-30 DIAGNOSIS — I10 Essential (primary) hypertension: Secondary | ICD-10-CM

## 2011-12-30 DIAGNOSIS — D510 Vitamin B12 deficiency anemia due to intrinsic factor deficiency: Secondary | ICD-10-CM

## 2011-12-30 DIAGNOSIS — B1920 Unspecified viral hepatitis C without hepatic coma: Secondary | ICD-10-CM

## 2011-12-30 LAB — CBC WITH DIFFERENTIAL/PLATELET
Lymphocytes Relative: 26 % (ref 12–46)
Lymphs Abs: 1.8 10*3/uL (ref 0.7–4.0)
Neutrophils Relative %: 63 % (ref 43–77)
Platelets: 703 10*3/uL — ABNORMAL HIGH (ref 150–400)
RBC: 3.87 MIL/uL (ref 3.87–5.11)
WBC: 6.9 10*3/uL (ref 4.0–10.5)

## 2011-12-30 LAB — BASIC METABOLIC PANEL
CO2: 25 mEq/L (ref 19–32)
Chloride: 106 mEq/L (ref 96–112)
Sodium: 141 mEq/L (ref 135–145)

## 2011-12-30 MED ORDER — CYANOCOBALAMIN 1000 MCG/ML IJ SOLN: 1000.0000 ug | INTRAMUSCULAR | Status: DC

## 2011-12-30 MED ORDER — ALBUTEROL SULFATE HFA 108 (90 BASE) MCG/ACT IN AERS: 2.0000 | INHALATION_SPRAY | Freq: Four times a day (QID) | RESPIRATORY_TRACT | Status: DC | PRN

## 2011-12-30 MED ORDER — LISINOPRIL 20 MG PO TABS: 20.0000 mg | ORAL_TABLET | Freq: Every day | ORAL | Status: DC

## 2011-12-30 MED ORDER — CYANOCOBALAMIN 1000 MCG/ML IJ SOLN
1000.0000 ug | Freq: Once | INTRAMUSCULAR | Status: AC
  Administered 2011-12-30: 1000 ug via INTRAMUSCULAR

## 2011-12-30 NOTE — Assessment & Plan Note (Addendum)
ACEi may not be very effective by itself (African American) but she is allergic to sulfa so HCTZ is CI. Will increase dose of lisinorpil today. If no responce, will add lasix next time. Check chem today.

## 2011-12-30 NOTE — Assessment & Plan Note (Addendum)
Check CBC Give b12 injection now and then every month to  monitor response with LDH (should be coming down, rise may suggest relapse), reticulocyte count and hemoglobin. Also check TSH (at next visit) to rule out other autoimmune disorders. Will also counsel her about family members at risk of PA

## 2011-12-30 NOTE — Patient Instructions (Signed)
Follow up in 1-2 weeks with me

## 2011-12-30 NOTE — Assessment & Plan Note (Signed)
Check Hap A Ab. Will give vaccines at next visit if this is negative Hep C clinic referral pending

## 2011-12-31 ENCOUNTER — Other Ambulatory Visit: Payer: Self-pay | Admitting: Internal Medicine

## 2011-12-31 MED ORDER — ALBUTEROL SULFATE HFA 108 (90 BASE) MCG/ACT IN AERS: 2.0000 | INHALATION_SPRAY | Freq: Four times a day (QID) | RESPIRATORY_TRACT | Status: DC | PRN

## 2011-12-31 NOTE — Progress Notes (Signed)
Patient ID: Cynthia Martinez, female   DOB: 12/08/1960, 51 y.o.   MRN: [id number]  50 year old woman with pernicious anemia, hepatitis C and hypertension comes to the clinic for followup. No new complaints today. Anxious about her hypertension. Was seen in the infectious disease clinic before she came here because her HIV Elisa was positive and she was in the hospital but Western blot was negative. She has a referral for hepatitis C clinic.  She assessment and plan for further details of history of present illness  Physical exam   General Appearance:     Filed Vitals:   12/30/11 1410  BP: 160/90  Pulse: 103  Temp: 98.9 F (37.2 C)  TempSrc: Oral  Height: 5\' 3"  (1.6 m)  Weight: 100 lb 12.8 oz (45.723 kg)  SpO2: 95%     Alert, cooperative, no distress, appears stated age  Head:    Normocephalic, without obvious abnormality, atraumatic  Eyes:    PERRL, conjunctiva/corneas clear, EOM's intact, fundi    benign, both eyes       Neck:   Supple, symmetrical, trachea midline, no adenopathy;       thyroid:  No enlargement/tenderness/nodules; no carotid   bruit or JVD  Lungs:     Clear to auscultation bilaterally, respirations unlabored  Chest wall:    No tenderness or deformity  Heart:    Regular rate and rhythm, S1 and S2 normal, no murmur, rub   or gallop  Abdomen:     Soft, non-tender, bowel sounds active all four quadrants,    no masses, no organomegaly  Extremities:   Extremities normal, atraumatic, no cyanosis or edema  Pulses:   2+ and symmetric all extremities  Skin:   Skin color, texture, turgor normal, no rashes or lesions  Neurologic:  nonfocal grossly   Review of system  Constitutional: Denies fever, chills, diaphoresis, appetite change and fatigue.  Respiratory: Denies SOB, DOE, cough, chest tightness,  and wheezing.   Cardiovascular: Denies chest pain, palpitations and leg swelling.  Gastrointestinal: Denies nausea, vomiting, abdominal pain, diarrhea, constipation,  blood in stool and abdominal distention.  Skin: Denies pallor, rash and wound.  Neurological: Denies dizziness, light-headedness, numbness and headaches.

## 2012-01-01 NOTE — Telephone Encounter (Signed)
States she has not heard back yet about the appt with hep clinic. Wants one on the 18th. Prefers Monday ams. Told her we can try but are at the mercy of that clinic's scheduling. Forwarded to Dr. Blair Dolphin nurse

## 2012-01-02 ENCOUNTER — Telehealth: Payer: Self-pay | Admitting: *Deleted

## 2012-01-02 NOTE — Telephone Encounter (Signed)
Pt wondering about referral to [facility] for Hep C follow-up.  Blood work was drawn at [facility].  RN advised that Dr. Orvan Falconer will be reviewing the lab results and [facility] will be back in touch early next week.  Pt verbalized understanding.

## 2012-01-02 NOTE — Telephone Encounter (Signed)
Please go ahead and make the referral. Also, ask her what her husband/fiance's name is so I can call him as she requested.

## 2012-01-02 NOTE — Progress Notes (Signed)
Addended by: Scot Dock, Corazon Nickolas V on: 01/02/2012 04:56 PM   Modules accepted: Orders

## 2012-01-03 NOTE — Telephone Encounter (Signed)
Referral to Hep C Clinic done.  Angelina Ok, RN 01/03/2012 11:11 AM

## 2012-01-07 ENCOUNTER — Encounter: Payer: Self-pay | Admitting: *Deleted

## 2012-01-07 ENCOUNTER — Telehealth: Payer: Self-pay | Admitting: *Deleted

## 2012-01-07 DIAGNOSIS — B1920 Unspecified viral hepatitis C without hepatic coma: Secondary | ICD-10-CM

## 2012-01-07 MED ORDER — ALBUTEROL SULFATE HFA 108 (90 BASE) MCG/ACT IN AERS: 2.0000 | INHALATION_SPRAY | Freq: Four times a day (QID) | RESPIRATORY_TRACT | Status: DC | PRN

## 2012-01-07 NOTE — Telephone Encounter (Signed)
Call from pt said that the Inhaler she was given  irritates her throat.  Would like to get a Proventil Inhaler.  Has used her boyfriend's in the past. Angelina Ok, RN 01/07/2012 12:41 PM

## 2012-01-07 NOTE — Telephone Encounter (Signed)
Referral documentation prepared and faxed with form.

## 2012-01-07 NOTE — Progress Notes (Signed)
Pt stated that fiance's name is Meda Coffee.  Telephone number is [phone].  Is available at this number after 5pm.  He gets off work at 4:30PM.

## 2012-01-13 ENCOUNTER — Telehealth: Payer: Self-pay | Admitting: *Deleted

## 2012-01-13 NOTE — Telephone Encounter (Signed)
RN to check on referral.  Pt informed that this type of referral takes come time due to the limited spaces at [facility] C Clinic.  Pt also does not have insurance, "orange" card.  RN spoke with [facility].  Received faxed referral.  MD to review referral information.  They are scheduling in to June, 2013 now.  Their office will notify the pt of the appt after completion of review.   RN called pt with this information.

## 2012-01-16 NOTE — Telephone Encounter (Signed)
Pt was called.  Would prefer that referral be made locally.  Referral made to [facility].

## 2012-01-16 NOTE — Telephone Encounter (Signed)
Pt informed that the Hep C Clinic is reviewing her records and will contact her with appt information.  Next available appt is not until June 2013.  Pt verbalized understanding.

## 2012-02-18 ENCOUNTER — Encounter: Payer: Self-pay | Admitting: Internal Medicine

## 2012-02-18 ENCOUNTER — Encounter: Payer: Self-pay | Admitting: *Deleted

## 2012-02-18 ENCOUNTER — Ambulatory Visit (INDEPENDENT_AMBULATORY_CARE_PROVIDER_SITE_OTHER): Payer: Self-pay | Admitting: Internal Medicine

## 2012-02-18 VITALS — BP 165/105 | HR 94 | Temp 97.6°F | Ht 63.0 in | Wt 101.8 lb

## 2012-02-18 DIAGNOSIS — D510 Vitamin B12 deficiency anemia due to intrinsic factor deficiency: Secondary | ICD-10-CM

## 2012-02-18 DIAGNOSIS — R05 Cough: Secondary | ICD-10-CM

## 2012-02-18 LAB — TSH: TSH: 1.011 u[IU]/mL (ref 0.350–4.500)

## 2012-02-18 LAB — CBC
Platelets: 344 10*3/uL (ref 150–400)
RBC: 5.21 MIL/uL — ABNORMAL HIGH (ref 3.87–5.11)
WBC: 5.3 10*3/uL (ref 4.0–10.5)

## 2012-02-18 MED ORDER — CYANOCOBALAMIN 1000 MCG/ML IJ SOLN: 1000.0000 ug | INTRAMUSCULAR | Status: DC

## 2012-02-18 MED ORDER — OMEPRAZOLE 20 MG PO TBEC: 40.0000 mg | DELAYED_RELEASE_TABLET | Freq: Every day | ORAL | Status: DC

## 2012-02-18 MED ORDER — FLUTICASONE PROPIONATE 50 MCG/ACT NA SUSP: 1.0000 | Freq: Every day | NASAL | Status: DC

## 2012-02-18 NOTE — Assessment & Plan Note (Signed)
Ongoing for the past 2 weeks. Had similar episode in January when she was in the hospital at which time x-rays and other blood tests were negative. That episode lasted for a month and she was also prescribed antibiotic by an urgent care before she was admitted to the hospital. She had another episode in November last year which also lasted for a month. Her cough is throughout the day but much worse when she lies down and while in the morning. It is associated with whitish yellowish sputum. She's also had some shortness of breath when she is congested but gets better when she coughs up sputum. She's been using over-the-counter Mucinex with some benefit. She denies any fever, chills, sick contacts or rash. She has exposure to passive smoking. She uses albuterol as needed for shortness of breath and cough with some benefit.  Plan I will treat her for acid reflux and postnasal drip to begin with. If there's no improvement I did treat her with prednisone course and maybe an antibiotic for possible bronchitis I see her back in one to 2 weeks

## 2012-02-18 NOTE — Patient Instructions (Signed)
Cough, Adult  A cough is a reflex. It helps you clear your throat and airways. A cough can help heal your body. A cough can last 2 or 3 weeks (acute) or may last more than 8 weeks (chronic). Some common causes of a cough can include an infection, allergy, or a cold. HOME CARE  Only take medicine as told by your doctor.   If given, take your medicines (antibiotics) as told. Finish them even if you start to feel better.   Use a cold steam vaporizer or humidier in your home. This can help loosen thick spit (secretions).   Sleep so you are almost sitting up (semi-upright). Use pillows to do this. This helps reduce coughing.   Rest as needed.   Stop smoking if you smoke.  GET HELP RIGHT AWAY IF:  You have yellowish-white fluid (pus) in your thick spit.   Your cough gets worse.   Your medicine does not reduce coughing, and you are losing sleep.   You cough up blood.   You have trouble breathing.   Your pain gets worse and medicine does not help.   You have a fever.  MAKE SURE YOU:   Understand these instructions.   Will watch your condition.   Will get help right away if you are not doing well or get worse.  Document Released: 07/18/2011 Document Revised: 10/24/2011 Document Reviewed: 07/18/2011 Exit[employer] Patient Information 2012 [employer]. 

## 2012-02-18 NOTE — Assessment & Plan Note (Signed)
Check b12 and TSH. Start b12 q-weekly until stores repleted.

## 2012-02-18 NOTE — Progress Notes (Signed)
Patient ID: Cynthia Martinez, female   DOB: [birth year], 51 y.o.   MRN: [id number]  Referral w/ Dr. Brooke Dare, [facility], May 01, 2011 @ 3:30 PM.

## 2012-02-18 NOTE — Progress Notes (Signed)
Patient ID: Cynthia Martinez, female   DOB: May 30, 1961, 51 y.o.   MRN: [id number]  51 year old woman with past medical history listed below costal followup Complains of cough that has been going on for past 2 weeks. She assessment and plan diabetes Requests vitamin B12 every week because that was helping her feel better. She feels tired if she does not take it every week.  Physical exam   General Appearance:     Filed Vitals:   02/18/12 0855  BP: 165/105  Pulse: 94  Temp: 97.6 F (36.4 C)  TempSrc: Oral  Height: 5\' 3"  (1.6 m)  Weight: 101 lb 12.8 oz (46.176 kg)  SpO2: 95%     Alert, cooperative, no distress, appears stated age  Head:    Normocephalic, without obvious abnormality, atraumatic  Eyes:    PERRL, conjunctiva/corneas clear, EOM's intact, fundi    benign, both eyes       Neck:   Supple, symmetrical, trachea midline, no adenopathy;       thyroid:  No enlargement/tenderness/nodules; no carotid   bruit or JVD  Lungs:     bilateral rhonchi without any wheezes, equal air entry bilaterally   Chest wall:    No tenderness or deformity  Heart:    Regular rate and rhythm, S1 and S2 normal, no murmur, rub   or gallop  Abdomen:     Soft, non-tender, bowel sounds active all four quadrants,    no masses, no organomegaly  Extremities:   Extremities normal, atraumatic, no cyanosis or edema  Pulses:   2+ and symmetric all extremities  Skin:   Skin color, texture, turgor normal, no rashes or lesions  Neurologic:  nonfocal grossly    Review of system-

## 2012-03-19 ENCOUNTER — Ambulatory Visit (INDEPENDENT_AMBULATORY_CARE_PROVIDER_SITE_OTHER): Payer: Self-pay | Admitting: Ophthalmology

## 2012-03-19 ENCOUNTER — Encounter: Payer: Self-pay | Admitting: Ophthalmology

## 2012-03-19 VITALS — BP 181/107 | HR 101 | Temp 97.1°F | Ht 63.0 in | Wt 103.5 lb

## 2012-03-19 DIAGNOSIS — I10 Essential (primary) hypertension: Secondary | ICD-10-CM

## 2012-03-19 DIAGNOSIS — R05 Cough: Secondary | ICD-10-CM

## 2012-03-19 DIAGNOSIS — R636 Underweight: Secondary | ICD-10-CM | POA: Insufficient documentation

## 2012-03-19 DIAGNOSIS — D510 Vitamin B12 deficiency anemia due to intrinsic factor deficiency: Secondary | ICD-10-CM

## 2012-03-19 DIAGNOSIS — B1920 Unspecified viral hepatitis C without hepatic coma: Secondary | ICD-10-CM

## 2012-03-19 MED ORDER — VITAMIN B-12 1000 MCG PO TABS: 1000.0000 ug | ORAL_TABLET | Freq: Every day | ORAL | Status: DC

## 2012-03-19 MED ORDER — AMLODIPINE BESYLATE 5 MG PO TABS: 5.0000 mg | ORAL_TABLET | Freq: Every day | ORAL | Status: DC

## 2012-03-19 NOTE — Assessment & Plan Note (Signed)
Patient's HTN occurred somewhat rapidly during her admission in January and is likely due to increased salt load since she was eating very poorly prior when the pernicious anemia had affected her ability to taste. Her lisinopril was increased from 10 to 20mg  but this does not seem to be controlling BP. ACE alone in african american patients has low efficacy. Patient developed allergy to HCTZ. Will start her on amlodipine 5mg  and have her come back in 2 weeks for quick BP check. She will likely need to be increased to 10mg  at that time. Should also restart ACE if she has not had improvement in cough.

## 2012-03-19 NOTE — Assessment & Plan Note (Signed)
Patient was referred to Hepatitis C clinic and she was contact with an appointment for June.

## 2012-03-19 NOTE — Assessment & Plan Note (Signed)
Patient was trialed on PPI without much change. Still complaining of AM productive cough. Non-smoker but exposed to smoke in the home. Will stop Ace inhibitor for 2 weeks and see if there is any improvement in cough. I am not suspicious that it is ACE inhibitor since it is not dry cough and it is only in AM and it seems to have preceded start of ACE. Patient should be put back on ACE if cough not improved.

## 2012-03-19 NOTE — Progress Notes (Signed)
  Subjective:   Patient ID: Cynthia Martinez female   DOB: 1961/09/30 51 y.o.   MRN: [id number]  HPI: [name] is a 51 y.o. woman who presents for follow up for cough. Cough is mostly at night and in the morning. Cough has been present since she left the hospital in January. She has been having been coughing up phlegm that is mostly whitish but ocasionally has yellow-greenish color. This is only in the AM and she feels fine after this. She was started on nighttime PPI one month ago with little effect.  HTN- patient was initially on HCTZ but she developed hives to it, so she was switched to lisinopril. Blood pressure not well controlled. Will need to switch agents. Pulse is 101.  Underweight- patient's appetite is much improved on B12 shots, she has gained 10 lbs Dr. Rogelia Boga brought up cpncern for celiac disease- Denies any crampy stomach pain or diarrhea, or flatulence  Pernicious Anemia- patient's Hgb was 14.1, was 9-10 previously so she has responded well to B12 shots. B12 level WNL as of 4/2.  Marland Kitchen  Past Medical History  Diagnosis Date  . Anemia   . DUB (dysfunctional uterine bleeding)   . Hypertension    Current Outpatient Prescriptions  Medication Sig Dispense Refill  . albuterol (VENTOLIN HFA) 108 (90 BASE) MCG/ACT inhaler Inhale 2 puffs into the lungs every 6 (six) hours as needed for wheezing.  6.7 g  3  . amLODipine (NORVASC) 5 MG tablet Take 1 tablet (5 mg total) by mouth daily.  30 tablet  3  . fluticasone (FLONASE) 50 MCG/ACT nasal spray Place 1 spray into the nose daily.  16 g  2  . lisinopril (PRINIVIL,ZESTRIL) 20 MG tablet Take 1 tablet (20 mg total) by mouth daily.  30 tablet  11  . Omeprazole 20 MG TBEC Take 2 tablets (40 mg total) by mouth at bedtime.  60 each  1  . vitamin B-12 (CYANOCOBALAMIN) 1000 MCG tablet Take 1 tablet (1,000 mcg total) by mouth daily.  60 tablet  6   Family History  Problem Relation Age of Onset  . Diabetes     History   Social  History  . Marital Status: Single    Spouse Name: N/A    Number of Children: N/A  . Years of Education: N/A   Social History Main Topics  . Smoking status: Former Games developer  . Smokeless tobacco: None  . Alcohol Use: 0.0 oz/week     socially  . Drug Use: No  . Sexually Active: None   Other Topics Concern  . None   Social History Narrative   Lives with fiancee in [place] daughter in 20's and 30'3.Worked as Advertising copywriter in past.   Objective:  Physical Exam: Filed Vitals:   03/19/12 0901  BP: 181/107  Pulse: 101  Temp: 97.1 F (36.2 C)  TempSrc: Oral  Height: 5\' 3"  (1.6 m)  Weight: 103 lb 8 oz (46.947 kg)  SpO2: 95%   I rechecked BP and it was 172/102 after patient had been sitting for several minutes.  General: pleasant thin middles aged woman sitting in chair HEENT: PERRL, EOMI, no scleral icterus Cardiac: RRR, no rubs, murmurs or gallops Pulm: clear to auscultation bilaterally, moving normal volumes of air Neuro: alert and oriented X3, cranial nerves II-XII grossly intact  Assessment & Plan:

## 2012-03-19 NOTE — Patient Instructions (Signed)
-  Please stop taking lisinopril until we see next time. See if your cough improves. -Please start taking amlodipine 5mg  daily to control your blood pressure. -we'll see you back in 2 weeks for BP check.

## 2012-03-19 NOTE — Assessment & Plan Note (Signed)
Patient's B12 stores have now been repleted. I have asked her to switch to oral B12 or 1mg  daily. 1-2 mg is recommended in patient's with pernicious anemia. She will need her B12 level checked in June or July at next follow up visit to see if oral supplementation is adequate.

## 2012-03-19 NOTE — Assessment & Plan Note (Signed)
BMI has increased from 16.48 to 18.34. Patient reports good appetite and denies any diarrhea, flatulence etc. Can check for celiac disease next time we get labs since some people are essentially asymptomatic.

## 2012-04-03 ENCOUNTER — Encounter: Payer: Self-pay | Admitting: Ophthalmology

## 2012-04-15 ENCOUNTER — Encounter: Payer: Self-pay | Admitting: Ophthalmology

## 2012-04-30 ENCOUNTER — Ambulatory Visit (INDEPENDENT_AMBULATORY_CARE_PROVIDER_SITE_OTHER): Payer: Self-pay | Admitting: Gastroenterology

## 2012-04-30 DIAGNOSIS — B182 Chronic viral hepatitis C: Secondary | ICD-10-CM | POA: Insufficient documentation

## 2012-05-01 ENCOUNTER — Telehealth: Payer: Self-pay | Admitting: *Deleted

## 2012-05-01 NOTE — Telephone Encounter (Signed)
Patient called asking if she needs a follow up appt at this clinic.  She was referred to [facility] for [facility].  They felt she did not have a confirmatory negative HIV test.  Told her I could see that HIV labs have been ordered and she said that she was suppose to have more blood drawn next week.  She does not need a follow up here unless she has a positive HIV test. Wendall Mola CMA

## 2012-05-07 NOTE — Progress Notes (Signed)
NAMEMarland Martinez  Cynthia, Martinez  MR#:  [id number]      DATE:  04/30/2012  DOB:  [birth year]    cc: Primary Care Physician: Same Referring Physician: Moss Mc. Scot Dock, MD, [facility], [address] [place], [place] [address], Fax [phone]    REASON FOR REFERRAL:  Positive HCV RNA.   HISTORY:  The patient is a 51 year old woman who I have been asked to see in consultation by Dr. Scot Dock regarding a positive HCV RNA.   According to the patient, she was unaware of her diagnosis of hepatitis C until she was found to be antibody positive on 12/15/2011, when admitted to the hospital for anemia that was, I gather, ultimately thought to be pernicious anemia. 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 Furthermore, her HIV RNA was undetectable.   There are currently no symptoms referable to her history of hepatitis C nor are there symptoms to suggest cryoglobulin mediated or decompensated liver disease.   With respect to risk factors for liver disease, she consumes approximately 2 glasses of wine once or twice a month. There is no history of liquor or beer consumption. She denies any history of intravenous or intranasal drug use, tattoos or unsterile body piercing or blood transfusions prior to 1992. She denies any family history of liver disease. She reports that she has not been vaccinated against hepatitis A or B. She was total hepatitis A antibody negative on 12/30/2011.   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 She was not treated with B12 injections. There is also a history of hypertension. As previously mentioned, there is a positive HIV antibody, but the confirmatory test was negative, HIV viral  load was negative.   PAST SURGICAL HISTORY:  Resection of uterine fibroids.   PAST PSYCHIATRIC HISTORY:  Denies.   CURRENT MEDICATIONS:  1. Cyanocobalamin or B12 injection once monthly.  2. Amlodipine 5 mg daily for hypertension.   ALLERGIES:  PENICILLIN AND SULFA CAUSES A RASH.   HABITS:  Smoking quit years ago. Alcohol as above.   FAMILY HISTORY: As above.   SOCIAL HISTORY:  Single with 2 children. 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   REVIEW OF SYSTEMS:  All 10 systems reviewed today on the review of systems form, which was signed and placed in the chart. Her CES-D was 11. She reports concern over weight loss.   PHYSICAL EXAMINATION:   Constitutional: Thin appearing with some peripheral wasting. Vital Signs: Height 63 inches, weight 98.4 pounds. On 02/18/2012, 101 pounds.  Blood pressure 130/100, pulse of 101, temperature 98.6 Fahrenheit. Ears, Nose, Mouth and Throat:  Unremarkable oropharynx.  No thyromegaly or neck masses.  Chest:  Resonant to percussion.  Clear to auscultation.  Cardiovascular:  Heart sounds normal S1, S2 without murmurs or rubs.  There is no peripheral edema.  Abdomen:  Normal bowel sounds.  No masses or tenderness.  I could not appreciate a liver edge or spleen tip.  I could not appreciate any hernias.  Lymphatics:  No cervical or inguinal lymphadenopathy.  Central Nervous System:  No asterixis or focal neurologic findings.  Dermatologic:  Anicteric without palmar erythema or spider angiomata.  Eyes:  Anicteric sclerae.  Pupils are equal and reactive to light.  LABORATORIES:  Previous lab work was reviewed in [employer].   Relevant labs show that on 02/18/2012, CBC showed 100, hemoglobin 14 with a platelet count of 344, white count of 5.3. Her TSH on 02/18/2012, was 1.011. On 12/30/2011, total hepatitis A antibody was negative. Her RPR on 12/19/2011, was  negative. On 11/21/2011, HIV testing showed she did not have any viremia. QuantiFERON-TB TB Gold assay was negative.   There is no relevant imaging studies of the liver.   ASSESSMENT:  The patient is a 51 year old woman with a history of genotype unknown HCV. She appears to be clinically and biochemically well compensated.   I would like to repeat her HIV testing today to see what her status is, because if she was HIV positive, we will be concerned about trying to treat her, but drug interactions would have to be taken into account. If she is HIV negative, there would be less of a concern to treat her aggressively at this point, and perhaps wait for the availability of some of the new agents next year.   I understand the HIV issue was investigated in the past, but it would be reassuring to get another set of serologies that are negative, especially in view of her weight loss.   She carries a diagnosis of pernicious anemia, but this appears to be resolved with vitamin B12 injections. I do not see this as a contraindication to starting to treat her for hepatitis C.   In my discussion today with the patient, we discussed the importance of genotyping her. We discussed the role of biopsy for genotype 1. We discussed treatment with pegylated interferon, ribavirin for all genotypes with an SVR of approximately 70%, and the addition of a protease inhibitor for genotype 1.    We discussed treatment duration and follow up in our clinic. We discussed the frequency of lab work. I have discussed the impact that HIV could have on her HCV course if she was positive, which to date appears that she is not. We discussed the risk of contagion. I reviewed the specific system, constitutional, and psychiatric side effects of therapy. The patient is willing to go ahead with the evaluation of her liver disease.   PLAN:  1. Standard labs.  2. Test for hepatitis A and B immunity, already known to be hepatitis A nave, and  will need vaccinations in the future.  3. Genotype.  4. Recheck HIV serologies and a CD4 count.  5. Literature on hepatitis C given.  6. She is to return in 3 months' time to review the results of testing, but if found to be genotype 1 in the interim will arrange for a biopsy.              Brooke Dare, MD   ADDENDUM No record of labs being done.  The responsibility will be hers to complete her lab testing.  403 .[id number]  D:  Thu Jun 13 17:49:48 2013 ; T:  Thu Jun 13 22:06:44 2013  Job #:  [id number]

## 2012-05-14 ENCOUNTER — Ambulatory Visit (INDEPENDENT_AMBULATORY_CARE_PROVIDER_SITE_OTHER): Payer: Self-pay | Admitting: Internal Medicine

## 2012-05-14 ENCOUNTER — Encounter: Payer: Self-pay | Admitting: Internal Medicine

## 2012-05-14 VITALS — BP 144/97 | HR 112 | Temp 97.4°F | Wt 95.4 lb

## 2012-05-14 DIAGNOSIS — R05 Cough: Secondary | ICD-10-CM

## 2012-05-14 DIAGNOSIS — R636 Underweight: Secondary | ICD-10-CM

## 2012-05-14 DIAGNOSIS — O4100X0 Oligohydramnios, unspecified trimester, not applicable or unspecified: Secondary | ICD-10-CM

## 2012-05-14 DIAGNOSIS — E46 Unspecified protein-calorie malnutrition: Secondary | ICD-10-CM

## 2012-05-14 MED ORDER — VITAMIN B-12 1000 MCG PO TABS: 1000.0000 ug | ORAL_TABLET | Freq: Every day | ORAL | Status: AC

## 2012-05-14 MED ORDER — FLUTICASONE-SALMETEROL 250-50 MCG/DOSE IN AEPB: 1.0000 | INHALATION_SPRAY | Freq: Two times a day (BID) | RESPIRATORY_TRACT | Status: DC

## 2012-05-14 MED ORDER — AMLODIPINE BESYLATE 5 MG PO TABS: 5.0000 mg | ORAL_TABLET | Freq: Every day | ORAL | Status: DC

## 2012-05-14 NOTE — Progress Notes (Signed)
  Subjective:    Patient ID: Cynthia Martinez, female    DOB: Apr 20, 1961, 51 y.o.   MRN: [id number]  HPI  This is a follow up appointment for chronic cough.  Cough which started 6 months ago, worse in the morning and night, sputum is greenish and yellow and thick, nothing makes it better. Patient wakes up constantly in the middle of the night and has to elevate her bed to relieve cough. No blood noted. You have tried priilosec for 2 months which did not help and over the counter cough suppressants medications which did not help. Albuterol helps with breathing. Not a smoker anymore. She quit 8 years ago and used to smoke 3-4 cig a day.     Review of Systems  Constitutional: Negative for fever, activity change and appetite change.  HENT: Negative for sore throat.   Respiratory: Positive for cough and shortness of breath.   Cardiovascular: Negative for chest pain and leg swelling.  Gastrointestinal: Negative for nausea, abdominal pain, diarrhea, constipation and abdominal distention.  Genitourinary: Negative for frequency, hematuria and difficulty urinating.  Neurological: Negative for dizziness and headaches.  Psychiatric/Behavioral: Negative for suicidal ideas and behavioral problems.       Objective:   Physical Exam  Constitutional: She is oriented to person, place, and time. She appears well-developed and well-nourished.  HENT:  Head: Normocephalic and atraumatic.  Eyes: Conjunctivae and EOM are normal. Pupils are equal, round, and reactive to light. No scleral icterus.  Neck: Normal range of motion. Neck supple. No JVD present. No thyromegaly present.  Cardiovascular: Normal rate, regular rhythm, normal heart sounds and intact distal pulses.  Exam reveals no gallop and no friction rub.   No murmur heard. Pulmonary/Chest: Effort normal. No respiratory distress. She has wheezes. She has no rales.  Abdominal: Soft. Bowel sounds are normal. She exhibits no distension and no mass. There  is no tenderness. There is no rebound and no guarding.  Musculoskeletal: Normal range of motion. She exhibits no edema and no tenderness.  Lymphadenopathy:    She has no cervical adenopathy.  Neurological: She is alert and oriented to person, place, and time.  Psychiatric: She has a normal mood and affect. Her behavior is normal.          Assessment & Plan:

## 2012-05-14 NOTE — Patient Instructions (Signed)

## 2012-05-17 NOTE — Assessment & Plan Note (Signed)
I believe that given her symptoms, no improvement with prilosec, improvement with albuterol and physical exam s/o wheezing makes asthma a highly likely diagnosis at this time. I will start her on advair and gave her a sample today. Follow up in 1 month or as needed.

## 2012-05-17 NOTE — Assessment & Plan Note (Signed)
Patient was given referral with [facility] for weight gain.

## 2012-05-18 ENCOUNTER — Other Ambulatory Visit: Payer: Self-pay | Admitting: *Deleted

## 2012-05-18 MED ORDER — FLUTICASONE-SALMETEROL 250-50 MCG/DOSE IN AEPB: 1.0000 | INHALATION_SPRAY | Freq: Two times a day (BID) | RESPIRATORY_TRACT | Status: DC

## 2012-05-18 NOTE — Telephone Encounter (Signed)
Inhaler given and directions reviewed

## 2012-05-18 NOTE — Telephone Encounter (Signed)
She was supposed to take one puff daily. She can get another sample (I started the order but need to finish it with the lot #). Make certain she only takes only one puff daily

## 2012-05-18 NOTE — Telephone Encounter (Signed)
Pt called stating she was seen in clinic on 6/27 with Dr Eben Burow and was given a sample of advair 250/50.  She has been taking 2 puffs bid and the inhaler is empty.  She can't afford another one and wanted to come and get a sample.   She has a f/u appointment on the 18th.  Will you give her another sample to last until that appointment? Pt does not have insurance.

## 2012-06-04 ENCOUNTER — Encounter: Payer: Self-pay | Admitting: Internal Medicine

## 2012-06-04 ENCOUNTER — Ambulatory Visit: Payer: Self-pay | Admitting: Dietician

## 2012-06-12 ENCOUNTER — Ambulatory Visit: Payer: Self-pay | Admitting: Dietician

## 2012-06-12 ENCOUNTER — Encounter: Payer: Self-pay | Admitting: Internal Medicine

## 2012-06-16 ENCOUNTER — Telehealth: Payer: Self-pay | Admitting: Dietician

## 2012-06-16 NOTE — Telephone Encounter (Signed)
Patient returned call and rescheduled for Friday 06/19/12.

## 2012-06-19 ENCOUNTER — Ambulatory Visit (INDEPENDENT_AMBULATORY_CARE_PROVIDER_SITE_OTHER): Payer: Self-pay | Admitting: Dietician

## 2012-06-19 ENCOUNTER — Encounter: Payer: Self-pay | Admitting: Dietician

## 2012-06-19 ENCOUNTER — Other Ambulatory Visit: Payer: Self-pay | Admitting: Dietician

## 2012-06-19 VITALS — Ht 63.0 in | Wt 100.4 lb

## 2012-06-19 DIAGNOSIS — R636 Underweight: Secondary | ICD-10-CM

## 2012-06-19 NOTE — Patient Instructions (Addendum)
Body Mass Index (BMI) This BMI is not intended for use with those under 53 years of age, or pregnant women, or lactating women. To estimate BMI, locate your height, then find your weight in this listing. Your BMI is located to the right of your weight. Height: 63 inches  Weight: 107 lb = BMI 19 (normal)   Weight: 113 lb = BMI 20 (normal)   Weight: 118 lb = BMI 21 (normal)   Weight: 124 lb = BMI 22 (normal)   Weight: 130 lb = BMI 23 (normal)   Weight: 135 lb = BMI 24 (normal)   Weight: 141 lb = BMI 25 (overweight)   Weight: 146 lb = BMI 26 (overweight)   Weight: 152 lb = BMI 27 (overweight)   Weight: 158 lb = BMI 28 (overweight)   Weight: 163 lb = BMI 29 (overweight)   Weight: 169 lb = BMI 30 (obese)   Weight: 175 lb = BMI 31 (obese)   Weight: 180 lb = BMI 32 (obese)   Weight: 186 lb = BMI 33 (obese)   Weight: 191 lb = BMI 34 (obese)   Weight: 197 lb = BMI 35 (obese)   Weight: 203 lb = BMI 36 (obese)   Weight: 208 lb = BMI 37 (obese)   Weight: 214 lb = BMI 38 (obese)   Weight: 220 lb = BMI 39 (obese)   Weight: 225 lb = BMI 40 (extreme obesity)   Weight: 231 lb = BMI 41 (extreme obesity)   Weight: 237 lb = BMI 42 (extreme obesity)   Weight: 242 lb = BMI 43 (extreme obesity)   Weight: 248 lb = BMI 44 (extreme obesity)   Weight: 254 lb = BMI 45 (extreme obesity)   Weight: 259 lb = BMI 46 (extreme obesity)   Weight: 265 lb = BMI 47 (extreme obesity)   Weight: 270 lb = BMI 48 (extreme obesity)   Weight: 278 lb = BMI 49 (extreme obesity)   Weight: 282 lb = BMI 50 (extreme obesity)   Weight: 287 lb = BMI 51 (extreme obesity)   Weight: 293 lb = BMI 52 (extreme obesity)   Weight: 299 lb = BMI 53 (extreme obesity)   Weight: 304 lb = BMI 54 (extreme obesity)    HEALTH RISK CLASSIFICATION ACCORDING TO BODY MASS INDEX (BMI) Classification: Underweight.  BMI Category:  less than 18.5   Risk of developing health problems: Increased.    Classification: Normal Weight.  BMI Category: 18.5 to 24.9   Risk of developing health problems: Least.  Classification: Overweight.  BMI Category: 25.0 to 29.9   Risk of developing health problems: Increased.  Classification: Obese class.  BMI Category:  30.0 to 34.9   Risk of developing health problems: High.  Classification: Obese class II.  BMI Category:  35.0 to 39.9   Risk of developing health problems: Very high.  Classification: Obese class III.  BMI Category: 40.0 or more   Risk of developing health problems: Extremely high.  Note: For persons 21 years and older the 'normal' range may begin slightly above BMI 18.5 and extend into the 'overweight' range.   To clarify risk for each individual, other factors also need to be considered, such as:   Lifestyle habits.   Fitness level.   Presence or absence of other health risk conditions.   The classification system may underestimate or overestimate health risks in certain adults, such as:   Highly muscular adults. Very muscular adults, such  as athletes, may have a low percentage of body fat but a large amount of muscle tissue. This can result in a BMI in the overweight range that may over estimate the risk of developing health problems.   Adults who naturally have a very lean body build.   Young adults who have not reached full growth.   Adults over 81 years of age. For adults over age 75, more research is needed to determine if the cut-off points for the 'normal weight' range differ in any way from those for younger adults.   It is also important to note that BMI is only one part of a health risk assessment. To further clarify risk, other factors need to be considered as well.   Age, inherited traits, presence or absence of other conditions such as diabetes, high blood lipids, hypertension, and high blood glucose levels also influence the development of diseases associated with overweight. Risk factors such as  poor eating habits, physical inactivity, and tobacco use can play a role in the development of diseases associated with both overweight and underweight.  Consult a caregiver for a more complete assessment of your weight as it relates to health risk. It is important to discuss with your caregiver what BMI means for you as an individual. Maintaining a 'normal weight' is one element of good health. However, unhealthy eating habits, low levels of physical activity and tobacco use will increase the risk of health problems even for those within the normal weight range.   Goal weight is 107# to start. , eventually to 115- 125 # in a year or so.   To gain weight:   Please try to increase your milk/yogurt intake to at least 2 8 oz servings per day- drink milk instead of water until you get those 2 servings then drink as much water as you want.   Please try to add at least one snack or two between meals during day- liquids are easier to consume- juice, ice  Cream, yogurt or milk  Raisins Peanut or nay nuts are easy to carry in purse to snack on healthier  Snacks than cookies- oatmeal cookies/ granola bars/fig newtons, chocolate raisins, banana and ice cream.

## 2012-06-19 NOTE — Progress Notes (Signed)
Medical Nutrition Therapy:  Appt start time: 1130 end time:  1215. Assessment:  Primary concerns today: Weight management. - patient desires to gain up to 125#.  Usual eating pattern includes 3 meals and 1 snacks per day. Usual physical activity includes very little other than watching tv per patient. Everyday foods include fruit, juice.  Avoided foods include regular soda.   Estimated intake ~ 1700 calories +/-100/day. Estimated needs for weight gain ~ 1800-2000 calories/day  Progress Towards Goal(s):  In progress.   Nutritional Diagnosis:  NB-1.1 Food and nutrition-related knowledge deficit As related to lack of prior exposure to nutrition information.  As evidenced by patient report and questions.    Intervention:   1- Nutrition Education about BMI and risks of excess weight 2- Nutrition education about amount of exercise to prevent chronic disease. 3-Nutrition counseling on increasing her calorie intake in a healthy manner to increase her weight and muscle mass without increasing her risk of chronic diseases.  Monitoring/Evaluation:  Dietary intake, exercise, and body weight in 3 week(s).

## 2012-06-22 ENCOUNTER — Ambulatory Visit: Payer: Self-pay | Admitting: Dietician

## 2012-06-26 MED ORDER — FLUTICASONE-SALMETEROL 250-50 MCG/DOSE IN AEPB: 1.0000 | INHALATION_SPRAY | Freq: Two times a day (BID) | RESPIRATORY_TRACT | Status: DC

## 2012-06-26 NOTE — Telephone Encounter (Signed)
Pt was supposed to F/U as this was new med and hasn't. Pls sch F/U with PCP. Not urgent. Just whenever there is an opening

## 2012-07-10 ENCOUNTER — Telehealth: Payer: Self-pay | Admitting: *Deleted

## 2012-07-10 NOTE — Telephone Encounter (Signed)
review 

## 2012-07-14 ENCOUNTER — Ambulatory Visit: Payer: Self-pay | Admitting: Dietician

## 2012-08-04 ENCOUNTER — Ambulatory Visit: Payer: Self-pay | Admitting: Dietician

## 2012-08-06 ENCOUNTER — Ambulatory Visit: Payer: Self-pay | Admitting: Dietician

## 2012-08-06 ENCOUNTER — Ambulatory Visit: Payer: Self-pay | Admitting: Gastroenterology

## 2012-08-06 NOTE — Addendum Note (Signed)
Addended by: Neomia Dear on: 08/06/2012 02:44 PM   Modules accepted: Orders

## 2012-08-12 ENCOUNTER — Other Ambulatory Visit: Payer: Self-pay | Admitting: *Deleted

## 2012-08-12 MED ORDER — LISINOPRIL 20 MG PO TABS: 20.0000 mg | ORAL_TABLET | Freq: Every day | ORAL | Status: DC

## 2012-08-12 MED ORDER — AMLODIPINE BESYLATE 5 MG PO TABS: 5.0000 mg | ORAL_TABLET | Freq: Every day | ORAL | Status: DC

## 2012-08-15 ENCOUNTER — Encounter (HOSPITAL_COMMUNITY): Payer: Self-pay | Admitting: Emergency Medicine

## 2012-08-15 ENCOUNTER — Emergency Department (HOSPITAL_COMMUNITY): Payer: Self-pay

## 2012-08-15 ENCOUNTER — Emergency Department (HOSPITAL_COMMUNITY)
Admission: EM | Admit: 2012-08-15 | Discharge: 2012-08-15 | Disposition: A | Discharge status: Home / Self Care | Payer: Self-pay | Attending: Emergency Medicine | Admitting: Emergency Medicine

## 2012-08-15 DIAGNOSIS — Z79899 Other long term (current) drug therapy: Secondary | ICD-10-CM | POA: Insufficient documentation

## 2012-08-15 DIAGNOSIS — J439 Emphysema, unspecified: Secondary | ICD-10-CM

## 2012-08-15 DIAGNOSIS — I10 Essential (primary) hypertension: Secondary | ICD-10-CM | POA: Insufficient documentation

## 2012-08-15 DIAGNOSIS — R0602 Shortness of breath: Secondary | ICD-10-CM | POA: Insufficient documentation

## 2012-08-15 DIAGNOSIS — J438 Other emphysema: Secondary | ICD-10-CM | POA: Insufficient documentation

## 2012-08-15 DIAGNOSIS — Z87891 Personal history of nicotine dependence: Secondary | ICD-10-CM | POA: Insufficient documentation

## 2012-08-15 DIAGNOSIS — R062 Wheezing: Secondary | ICD-10-CM | POA: Insufficient documentation

## 2012-08-15 DIAGNOSIS — R42 Dizziness and giddiness: Secondary | ICD-10-CM | POA: Insufficient documentation

## 2012-08-15 LAB — CBC WITH DIFFERENTIAL/PLATELET
Eosinophils Absolute: 0 10*3/uL (ref 0.0–0.7)
Lymphocytes Relative: 26 % (ref 12–46)
Lymphs Abs: 2.2 10*3/uL (ref 0.7–4.0)
MCH: 28.8 pg (ref 26.0–34.0)
Neutro Abs: 5.5 10*3/uL (ref 1.7–7.7)
Neutrophils Relative %: 66 % (ref 43–77)
Platelets: 324 10*3/uL (ref 150–400)
RBC: 6.12 MIL/uL — ABNORMAL HIGH (ref 3.87–5.11)
WBC: 8.4 10*3/uL (ref 4.0–10.5)

## 2012-08-15 LAB — BASIC METABOLIC PANEL
CO2: 31 mEq/L (ref 19–32)
Calcium: 10.6 mg/dL — ABNORMAL HIGH (ref 8.4–10.5)
GFR calc non Af Amer: >90 mL/min (ref >90)
Glucose, Bld: 100 mg/dL — ABNORMAL HIGH (ref 70–99)
Potassium: 3.5 mEq/L (ref 3.5–5.1)
Sodium: 138 mEq/L (ref 135–145)

## 2012-08-15 MED ORDER — ALBUTEROL SULFATE HFA 108 (90 BASE) MCG/ACT IN AERS
2.0000 | INHALATION_SPRAY | RESPIRATORY_TRACT | Status: DC | PRN
  Administered 2012-08-15: 2 via RESPIRATORY_TRACT
  Filled 2012-08-15: qty 6.7

## 2012-08-15 MED ORDER — ALBUTEROL SULFATE (5 MG/ML) 0.5% IN NEBU
5.0000 mg | INHALATION_SOLUTION | Freq: Once | RESPIRATORY_TRACT | Status: AC
  Administered 2012-08-15: 5 mg via RESPIRATORY_TRACT
  Filled 2012-08-15: qty 1

## 2012-08-15 MED ORDER — PREDNISONE 20 MG PO TABS: 60.0000 mg | ORAL_TABLET | Freq: Every day | ORAL | Status: AC

## 2012-08-15 MED ORDER — IPRATROPIUM BROMIDE 0.02 % IN SOLN
0.5000 mg | Freq: Once | RESPIRATORY_TRACT | Status: AC
  Administered 2012-08-15: 0.5 mg via RESPIRATORY_TRACT
  Filled 2012-08-15: qty 2.5

## 2012-08-15 MED ORDER — PREDNISONE 20 MG PO TABS
60.0000 mg | ORAL_TABLET | Freq: Once | ORAL | Status: AC
  Administered 2012-08-15: 60 mg via ORAL
  Filled 2012-08-15: qty 3

## 2012-08-15 NOTE — ED Provider Notes (Signed)
Medical screening examination/treatment/procedure(s) were performed by non-physician practitioner and as supervising physician I was immediately available for consultation/collaboration.   Charles B. Bernette Mayers, MD 08/15/12 2236

## 2012-08-15 NOTE — ED Notes (Signed)
Pt. Was out of norvasc for 3 days, and took one this am. No improvement. H/a.

## 2012-08-15 NOTE — ED Notes (Signed)
Pt states understanding of discharge instructions 

## 2012-08-15 NOTE — ED Notes (Signed)
Pt reports have not taken her BP meds x 1 week. Today began with dizziness and headache at 0900 today.Pt AAOx4. Grips equal B/L.

## 2012-08-15 NOTE — ED Provider Notes (Signed)
History     CSN: [id number]  Arrival date & time 08/15/12  1425   First MD Initiated Contact with Patient 08/15/12 1854      Chief Complaint  Patient presents with  . Hypertension  . Dizziness    (Consider location/radiation/quality/duration/timing/severity/associated sxs/prior treatment) Patient is a 51 y.o. female presenting with hypertension. The history is provided by the patient.  Hypertension The current episode started yesterday. Pertinent negatives include no chest pain, chills or fever. Associated symptoms comments: She has not been on her blood pressure medications for one week, now restarted but presents for evaluation of headache and dizziness that began this morning because of concern that symptoms were due to her elevated blood pressure. She denies chest pain. She has shortness of breath but has a history of emphysema. She quite smoking a few months earlier, however, she resides with smokers. .    Past Medical History  Diagnosis Date  . Anemia   . DUB (dysfunctional uterine bleeding)   . Hypertension     Past Surgical History  Procedure Date  . Polypectomy     Family History  Problem Relation Age of Onset  . Diabetes      History  Substance Use Topics  . Smoking status: Former Games developer  . Smokeless tobacco: Not on file  . Alcohol Use: No     socially    OB History    Grav Para Term Preterm Abortions TAB SAB Ect Mult Living                  Review of Systems  Constitutional: Negative for fever and chills.  HENT: Negative.   Respiratory: Positive for shortness of breath.   Cardiovascular: Negative.  Negative for chest pain.  Gastrointestinal: Negative.   Musculoskeletal: Negative.   Skin: Negative.   Neurological: Negative.     Allergies  Hydrochlorothiazide; Penicillins; and Sulfa antibiotics  Home Medications   Current Outpatient Rx  Name Route Sig Dispense Refill  . ALBUTEROL SULFATE HFA 108 (90 BASE) MCG/ACT IN AERS Inhalation Inhale  2 puffs into the lungs every 6 (six) hours as needed for wheezing. 6.7 g 3  . AMLODIPINE BESYLATE 5 MG PO TABS Oral Take 1 tablet (5 mg total) by mouth daily. 90 tablet 0  . FLUTICASONE-SALMETEROL 250-50 MCG/DOSE IN AEPB Inhalation Inhale 1 puff into the lungs 2 (two) times daily. 1 each 1  . VITAMIN B-12 1000 MCG PO TABS Oral Take 1 tablet (1,000 mcg total) by mouth daily. 60 tablet 6    BP 156/103  Pulse 98  Temp 99.1 F (37.3 C) (Oral)  Resp 18  SpO2 99%  Physical Exam  Constitutional: She is oriented to person, place, and time. She appears well-developed and well-nourished.  HENT:  Head: Normocephalic.  Neck: Normal range of motion. Neck supple.  Cardiovascular: Normal rate and regular rhythm.   Pulmonary/Chest: She has wheezes. She has rales. She exhibits no tenderness.  Abdominal: Soft. Bowel sounds are normal. There is no tenderness. There is no rebound and no guarding.  Musculoskeletal: Normal range of motion.  Neurological: She is alert and oriented to person, place, and time. She displays normal reflexes. She exhibits normal muscle tone. Coordination normal.  Skin: Skin is warm and dry. No rash noted.  Psychiatric: She has a normal mood and affect.    ED Course  Procedures (including critical care time)  Labs Reviewed  CBC WITH DIFFERENTIAL - Abnormal; Notable for the following:    RBC 6.12 (*)  Hemoglobin 17.6 (*)     HCT 53.0 (*)     All other components within normal limits  BASIC METABOLIC PANEL - Abnormal; Notable for the following:    Glucose, Bld 100 (*)     Calcium 10.6 (*)     All other components within normal limits   Results for orders placed during the hospital encounter of 08/15/12  CBC WITH DIFFERENTIAL      Component Value Range   WBC 8.4  4.0 - 10.5 K/uL   RBC 6.12 (*) 3.87 - 5.11 MIL/uL   Hemoglobin 17.6 (*) 12.0 - 15.0 g/dL   HCT 14.7 (*) 82.9 - 56.2 %   MCV 86.6  78.0 - 100.0 fL   MCH 28.8  26.0 - 34.0 pg   MCHC 33.2  30.0 - 36.0 g/dL    RDW 13.0  86.5 - 78.4 %   Platelets 324  150 - 400 K/uL   Neutrophils Relative 66  43 - 77 %   Neutro Abs 5.5  1.7 - 7.7 K/uL   Lymphocytes Relative 26  12 - 46 %   Lymphs Abs 2.2  0.7 - 4.0 K/uL   Monocytes Relative 8  3 - 12 %   Monocytes Absolute 0.6  0.1 - 1.0 K/uL   Eosinophils Relative 0  0 - 5 %   Eosinophils Absolute 0.0  0.0 - 0.7 K/uL   Basophils Relative 0  0 - 1 %   Basophils Absolute 0.0  0.0 - 0.1 K/uL  BASIC METABOLIC PANEL      Component Value Range   Sodium 138  135 - 145 mEq/L   Potassium 3.5  3.5 - 5.1 mEq/L   Chloride 97  96 - 112 mEq/L   CO2 31  19 - 32 mEq/L   Glucose, Bld 100 (*) 70 - 99 mg/dL   BUN 12  6 - 23 mg/dL   Creatinine, Ser 6.96  0.50 - 1.10 mg/dL   Calcium 29.5 (*) 8.4 - 10.5 mg/dL   GFR calc non Af Amer >90  >90 mL/min   GFR calc Af Amer >90  >90 mL/min    Dg Chest 2 View  08/15/2012  *RADIOLOGY REPORT*  Clinical Data: Hypertension and dizziness  CHEST - 2 VIEW  Comparison: Chest radiograph 12/15/2011  Findings: Normal cardiac silhouette.  Sigmoid scoliosis noted. Lungs are hyperinflated.  No effusion, infiltrate, or pneumothorax.  IMPRESSION: No acute cardiopulmonary process.  Emphysematous change   Original Report Authenticated By: Genevive Bi, M.D.      No diagnosis found. 1. Emphysema 2. Hypertension    MDM  Blood pressure improves in ED. Nebulizer treatments with improved air movement. Prednisone given. O2 Saturations now 98% on room air, improved from 93%.         Rodena Medin, PA-C 08/15/12 2230

## 2012-09-09 ENCOUNTER — Encounter: Payer: Self-pay | Admitting: Internal Medicine

## 2012-09-09 ENCOUNTER — Ambulatory Visit (INDEPENDENT_AMBULATORY_CARE_PROVIDER_SITE_OTHER): Payer: No Typology Code available for payment source | Admitting: Internal Medicine

## 2012-09-09 ENCOUNTER — Telehealth: Payer: Self-pay | Admitting: Dietician

## 2012-09-09 VITALS — BP 173/110 | HR 104 | Temp 97.9°F | Ht 63.0 in | Wt 98.0 lb

## 2012-09-09 DIAGNOSIS — I10 Essential (primary) hypertension: Secondary | ICD-10-CM

## 2012-09-09 DIAGNOSIS — D510 Vitamin B12 deficiency anemia due to intrinsic factor deficiency: Secondary | ICD-10-CM

## 2012-09-09 DIAGNOSIS — R0602 Shortness of breath: Secondary | ICD-10-CM | POA: Insufficient documentation

## 2012-09-09 MED ORDER — METOPROLOL TARTRATE 25 MG PO TABS: 25.0000 mg | ORAL_TABLET | Freq: Two times a day (BID) | ORAL | Status: DC

## 2012-09-09 NOTE — Progress Notes (Signed)
  Subjective:    Patient ID: Cynthia Martinez, female    DOB: 06-01-1961, 51 y.o.   MRN: [id number]  HPI Presents for post ED visit for hypertension and sob.  Pt only taking Norvasc 5 mg daily.  Had been on HCTZ which caused hives and lisinopril which caused cough. Bp today 173/110 with pulse 104. Also stated that she was told in ED that she may have emphysema and was put her on course of prednisone and gave her "an inhaler" for her sob but states that it did not work as well as her prior Advair inhaler.  Using Albuterol  Every 2 -3 hours some days.  Saturating O2 well on room air today at 96%.   Review of Systems  Constitutional: Negative for fever.  HENT: Negative for congestion.   Respiratory: Negative for cough and shortness of breath.   Cardiovascular: Negative for chest pain.  Neurological: Negative for weakness and headaches.       Objective:   Physical Exam  Constitutional: She is oriented to person, place, and time. She appears well-developed and well-nourished. No distress.  HENT:  Head: Normocephalic and atraumatic.  Eyes: Conjunctivae normal and EOM are normal. Pupils are equal, round, and reactive to light.  Neck: Normal range of motion. Neck supple.  Cardiovascular: Normal rate and regular rhythm.   Pulmonary/Chest: Effort normal. No respiratory distress. She has wheezes. She has no rales.       Scattered wheezes over bilateral fields  Abdominal: Soft. Bowel sounds are normal.  Musculoskeletal: Normal range of motion.  Neurological: She is alert and oriented to person, place, and time.  Skin: Skin is warm and dry.  Psychiatric: She has a normal mood and affect.          Assessment & Plan:  1. Son: likely secondary to obstructive airway disease -gave sample of Advair today -refer to Pulmonologist for PFTs  2. Hypertension: uncontrolled on Amlodipine 5 mg qd, prior ACEi cough, HCTZ hives, ARB not on $4 plan nor Health [facility] list -will add metoprolol 25 mg bid and  increase Amlodipine to 10 mg qd -recheck bp in 2-3 weeks  3. Pernicious Anemia: recheck cbc at f/u  4. Preventative Care: no flu shot today, will re-address at f/u

## 2012-09-09 NOTE — Telephone Encounter (Signed)
Discussed progress with desired and appropriate weight gain with patient. She says she is still having problems with her appetite. She bought Ensure Plus today and plans to begin drinking 1-2 per day. RD to mail infomation about high calorie and high protein diet to patient for review.    Consider appetite stimulent (Megace) as BMI has decreased and is consistent with chronic malnutrition.

## 2012-09-09 NOTE — Patient Instructions (Addendum)
We gave you a sample of the Advair inhaler.  Take as directed. We will increase the Amlodipine to 10 mg daily and add Metoprolol 25 mg twice a day. Return to clinic in 2-3 weeks for a blood pressure recheck. We have also put in a referral to see a lung doctor.  You will be called when this is able to be scheduled.

## 2012-09-10 MED ORDER — FLUTICASONE-SALMETEROL 250-50 MCG/DOSE IN AEPB: 1.0000 | INHALATION_SPRAY | Freq: Two times a day (BID) | RESPIRATORY_TRACT | Status: DC

## 2012-09-28 ENCOUNTER — Encounter: Payer: Self-pay | Admitting: Internal Medicine

## 2012-09-28 ENCOUNTER — Ambulatory Visit (INDEPENDENT_AMBULATORY_CARE_PROVIDER_SITE_OTHER): Payer: Self-pay | Admitting: Internal Medicine

## 2012-09-28 VITALS — BP 138/92 | HR 85 | Temp 97.7°F | Ht 63.0 in | Wt 101.8 lb

## 2012-09-28 DIAGNOSIS — R05 Cough: Secondary | ICD-10-CM

## 2012-09-28 NOTE — Assessment & Plan Note (Signed)
Cough and dyspnea are mild. Smoking hx is underwhelming (assuming she is being trugthful). However, spirometry shows sevre obstructive lung disease while on advair. This is a bit surprising.I woill have her do formal PFTs. IF abnormal, add pred burst and spiriva and will reassess

## 2012-09-28 NOTE — Progress Notes (Signed)
Subjective:    Patient ID: Cynthia Martinez, female    DOB: 17-Mar-1961, 51 y.o.   MRN: [id number]  HPI PCP is Denton Ar, MD  Body mass index is 18.03 kg/(m^2).  reports that she quit smoking about 3 months ago. AGe 35 to 65; 1/2 ppd IOV 09/28/2012  51 year old female c.o chronic cough. She is worried she might have infection in lung. She feels better only after coughing and bringing out mucus; this is facilitated by inhalers  Cough x insidious onset x 1 year ago.  Stable course. Severit is moderate. Quality of cough: sleeps well "like a baby" and early in the morning she will have cough with mucus that is facilitaed by albuterol mdi. Post early morning cough: no further cough. Cough is present mostly only early in the morning and hardly ever for rest of the day.  No nocturnal cough. Rx with mdi (albuterold), prednisone, antibiotic courses, GERD but none of this has helped. HOwever, per her hx ppi course was only for 7 days and prednisone was only for 9 days.   Cough worsened by car heater, early in the morning and fiance's cig smoke (periodically exposed to his smoke). Cough is better by inhaler, passage of the day, absence of cig smoke exposure and open ventilation.. No associated wheeze, tickle in throat, gag.  In addition, there is associated dyspnea. Dyspnea correlated with cough but can also be exertional; 1 flight of steps. Rest improves dyspnea   Cough Relevant hx  BP  - on amlodipine for bp for 1 year  - on lopressor since oct 2013  Sinus  - denies sinus issues but has mild runny nose  - denies anosmia or blockage  Allergies  - denies seasonal or perennnial environmental allergies  Exposure  - cig smoke periodically from boyfriend  GI  - denies GERD. Has tried PPI for cough but did not help cough  Pulmonaary - She is on advair x 3 months for dyspnea and cough (as above) but of no help - ALbuterol helps short period with cough - CXR 08/15/12 - clear 09/28/2012 office  spirometry - fevq 0.8L/36%, R 36 and c/w SEVERE OBSTRUCTION Walking desaturation test on 09/28/2012 185 feet x 3 laps:  did NOT desaturate. Rest pulse ox was 91%, final pulse ox was 96%. HR response 86/min at rest to 96/min at peak exertion.      Dr Gretta Cool Reflux Symptom Index (> 13-15 suggestive of LPR cough) 0 -> 5  =  none ->severe problem 09/28/2012   Hoarseness of problem with voice 0  Clearing  Of Throat 0  Excess throat mucus or feeling of post nasal drip 1  Difficulty swallowing food, liquid or tablets 0  Cough after eating or lying down 0  Breathing difficulties or choking episodes 1  Troublesome or annoying cough 1  Sensation of something sticking in throat or lump in throat 0  Heartburn, chest pain, indigestion, or stomach acid coming up 0  TOTAL 3      Past Medical History  Diagnosis Date  . Anemia   . DUB (dysfunctional uterine bleeding)   . Hypertension      Family History  Problem Relation Age of Onset  . Diabetes       History   Social History  . Marital Status: Single    Spouse Name: N/A    Number of Children: N/A  . Years of Education: N/A   Occupational History  . Not on file.  Social History Main Topics  . Smoking status: Former Smoker -- 0.5 packs/day for 9 years    Types: Cigarettes    Quit date: 07/19/2009  . Smokeless tobacco: Not on file  . Alcohol Use: No     Comment: socially  . Drug Use: No  . Sexually Active: Not on file   Other Topics Concern  . Not on file   Social History Narrative   Lives with fiancee in [place] daughter in 20's and 57'3.Worked as Advertising copywriter in past.     Allergies  Allergen Reactions  . Hydrochlorothiazide Hives  . Penicillins Hives  . Sulfa Antibiotics Hives     Outpatient Prescriptions Prior to Visit  Medication Sig Dispense Refill  . albuterol (VENTOLIN HFA) 108 (90 BASE) MCG/ACT inhaler Inhale 2 puffs into the lungs every 6 (six) hours as needed for wheezing.  6.7 g  3  . amLODipine  (NORVASC) 5 MG tablet Take 1 tablet (5 mg total) by mouth daily.  90 tablet  0  . Fluticasone-Salmeterol (ADVAIR DISKUS) 250-50 MCG/DOSE AEPB Inhale 1 puff into the lungs 2 (two) times daily.  1 each  1  . metoprolol tartrate (LOPRESSOR) 25 MG tablet Take 1 tablet (25 mg total) by mouth 2 (two) times daily.  60 tablet  11  . vitamin B-12 (CYANOCOBALAMIN) 1000 MCG tablet Take 1 tablet (1,000 mcg total) by mouth daily.  60 tablet  6   Last reviewed on 09/28/2012  4:22 PM by Darrell Jewel, CMA     Review of Systems  Constitutional: Positive for appetite change and unexpected weight change. Negative for fever.  HENT: Negative for ear pain, nosebleeds, congestion, sore throat, rhinorrhea, sneezing, trouble swallowing, dental problem, postnasal drip and sinus pressure.   Eyes: Negative for redness and itching.  Respiratory: Positive for cough and shortness of breath. Negative for chest tightness and wheezing.   Cardiovascular: Negative for palpitations and leg swelling.  Gastrointestinal: Negative for nausea and vomiting.  Genitourinary: Negative for dysuria.  Musculoskeletal: Negative for joint swelling.  Skin: Negative for rash.  Neurological: Negative for headaches.  Hematological: Does not bruise/bleed easily.  Psychiatric/Behavioral: Negative for dysphoric mood. The patient is not nervous/anxious.        Objective:   Physical Exam  Vitals reviewed. Constitutional: She is oriented to person, place, and time. She appears well-developed and well-nourished. No distress.  HENT:  Head: Normocephalic and atraumatic.  Right Ear: External ear normal.  Left Ear: External ear normal.  Mouth/Throat: Oropharynx is clear and moist. No oropharyngeal exudate.  Eyes: Conjunctivae normal and EOM are normal. Pupils are equal, round, and reactive to light. Right eye exhibits no discharge. Left eye exhibits no discharge. No scleral icterus.  Neck: Normal range of motion. Neck supple. No JVD  present. No tracheal deviation present. No thyromegaly present.  Cardiovascular: Normal rate, regular rhythm, normal heart sounds and intact distal pulses.  Exam reveals no gallop and no friction rub.   No murmur heard. Pulmonary/Chest: Effort normal and breath sounds normal. No respiratory distress. She has no wheezes. She has no rales. She exhibits no tenderness.  Abdominal: Soft. Bowel sounds are normal. She exhibits no distension and no mass. There is no tenderness. There is no rebound and no guarding.  Musculoskeletal: Normal range of motion. She exhibits no edema and no tenderness.  Lymphadenopathy:    She has no cervical adenopathy.  Neurological: She is alert and oriented to person, place, and time. She has normal reflexes. No cranial nerve  deficit. She exhibits normal muscle tone. Coordination normal.  Skin: Skin is warm and dry. No rash noted. She is not diaphoretic. No erythema. No pallor.  Psychiatric: She has a normal mood and affect. Her behavior is normal. Judgment and thought content normal.          Assessment & Plan:

## 2012-09-28 NOTE — Patient Instructions (Addendum)
Please have full PFT breathing test next 48h Once the technician completes test, please remind them to page me with result Further instructions based on result

## 2012-09-30 ENCOUNTER — Ambulatory Visit (HOSPITAL_COMMUNITY)
Admission: RE | Admit: 2012-09-30 | Discharge: 2012-09-30 | Disposition: A | Discharge status: Home / Self Care | Payer: Self-pay | Source: Ambulatory Visit | Attending: Internal Medicine | Admitting: Internal Medicine

## 2012-09-30 ENCOUNTER — Other Ambulatory Visit (HOSPITAL_COMMUNITY): Payer: Self-pay | Admitting: Radiology

## 2012-09-30 DIAGNOSIS — R059 Cough, unspecified: Secondary | ICD-10-CM | POA: Insufficient documentation

## 2012-09-30 DIAGNOSIS — R05 Cough: Secondary | ICD-10-CM | POA: Insufficient documentation

## 2012-09-30 MED ORDER — ALBUTEROL SULFATE (5 MG/ML) 0.5% IN NEBU
2.5000 mg | INHALATION_SOLUTION | Freq: Once | RESPIRATORY_TRACT | Status: AC
  Administered 2012-09-30: 2.5 mg via RESPIRATORY_TRACT

## 2012-10-02 ENCOUNTER — Telehealth: Payer: Self-pay | Admitting: Internal Medicine

## 2012-10-02 NOTE — Telephone Encounter (Signed)
Pt returned call.  Per MR's request,  I set pt up with an appointment to get results from MR on 10/09/12 @ 3:45 pm.  Pt verbalized understanding & stated nothing further needed at this time.  Cynthia Martinez

## 2012-10-02 NOTE — Telephone Encounter (Signed)
pft 09/30/12 shows severe copd. fev1 0.9L/41%, rato 42, DLCO 14/58%. I called her cell phone and spoke to fiance to call back ofice. If she calls back, please only state that  I wouild like her to come in next week (week of 10/05/12) to discuss results. 15 min time slot  Message being sent to trige due to time sensitivity

## 2012-10-02 NOTE — Telephone Encounter (Signed)
Called pt's home # - [email or web address]  Called pt's cell # - spoke with fiance.  Was advised she is not in and will ask her to call office back.   Will hold in triage to f/u on.

## 2012-10-09 ENCOUNTER — Ambulatory Visit (INDEPENDENT_AMBULATORY_CARE_PROVIDER_SITE_OTHER): Payer: Self-pay | Admitting: Internal Medicine

## 2012-10-09 ENCOUNTER — Encounter: Payer: Self-pay | Admitting: Internal Medicine

## 2012-10-09 VITALS — BP 122/82 | HR 89 | Temp 97.9°F | Ht 63.0 in | Wt 102.6 lb

## 2012-10-09 DIAGNOSIS — J449 Chronic obstructive pulmonary disease, unspecified: Secondary | ICD-10-CM

## 2012-10-09 MED ORDER — TIOTROPIUM BROMIDE MONOHYDRATE 18 MCG IN CAPS: 18.0000 ug | ORAL_CAPSULE | Freq: Every day | RESPIRATORY_TRACT | Status: DC

## 2012-10-09 NOTE — Patient Instructions (Addendum)
You have copd Continue advair Please start spiriva 1 puff daily - take sample, script and show technique (due to cost issues and lack of insurance will give few samples) Take prednisone 40 mg daily x 2 days, then 20mg  daily x 2 days, then 10mg  daily x 2 days, then 5mg  daily x 2 days and stop REturn in 1 months with CAT and cough score at followup

## 2012-10-09 NOTE — Progress Notes (Signed)
Subjective:    Patient ID: Cynthia Martinez, female    DOB: 18-Feb-1961, 51 y.o.   MRN: [id number]  HPI PCP is Denton Ar, MD  Body mass index is 18.03 kg/(m^2).  reports that she quit smoking about 3 months ago. AGe 38 to 2; 1/2 ppd IOV 09/28/2012  51 year old female c.o chronic cough. She is worried she might have infection in lung. She feels better only after coughing and bringing out mucus; this is facilitated by inhalers  Cough x insidious onset x 1 year ago.  Stable course. Severit is moderate. Quality of cough: sleeps well "like a baby" and early in the morning she will have cough with mucus that is facilitaed by albuterol mdi. Post early morning cough: no further cough. Cough is present mostly only early in the morning and hardly ever for rest of the day.  No nocturnal cough. Rx with mdi (albuterold), prednisone, antibiotic courses, GERD but none of this has helped. HOwever, per her hx ppi course was only for 7 days and prednisone was only for 9 days.   Cough worsened by car heater, early in the morning and fiance's cig smoke (periodically exposed to his smoke). Cough is better by inhaler, passage of the day, absence of cig smoke exposure and open ventilation.. No associated wheeze, tickle in throat, gag.  In addition, there is associated dyspnea. Dyspnea correlated with cough but can also be exertional; 1 flight of steps. Rest improves dyspnea   Cough Relevant hx  BP  - on amlodipine for bp for 1 year  - on lopressor since oct 2013  Sinus  - denies sinus issues but has mild runny nose  - denies anosmia or blockage  Allergies  - denies seasonal or perennnial environmental allergies  Exposure  - cig smoke periodically from boyfriend currently - she is an ex-smoker   GI  - denies GERD. Has tried PPI for cough but did not help cough  Pulmonaary - She is on advair x 3 months for dyspnea and cough (as above) but of no help with cough but has helped dyspnea - ALbuterol  helps short period with cough - CXR 08/15/12 - clear (never had CT chest) - 09/28/2012 office spirometry - fevq 0.8L/36%, R 36 and c/w SEVERE OBSTRUCTION - Walking desaturation test on 09/28/2012 185 feet x 3 laps:  did NOT desaturate. Rest pulse ox was 91%, final pulse ox was 96%. HR response 86/min at rest to 96/min at peak       Dr Gretta Cool Reflux Symptom Index (> 13-15 suggestive of LPR cough)  09/28/2012   Hoarseness of problem with voice 0  Clearing  Of Throat 0  Excess throat mucus or feeling of post nasal drip 1  Difficulty swallowing food, liquid or tablets 0  Cough after eating or lying down 0  Breathing difficulties or choking episodes 1  Troublesome or annoying cough 1  Sensation of something sticking in throat or lump in throat 0  Heartburn, chest pain, indigestion, or stomach acid coming up 0  TOTAL 3    REC Please have full PFT breathing test next 48h  Once the technician completes test, please remind them to page me with result  Further instructions based on result  OV 10/09/2012  pft 09/30/12 shows severe copd. fev1 0.9L/41%, rato 42, DLCO 14/58%. SHe is here to discuss results. No interim events  Past, Family, Social reviewed: no change since last visit    Review of Systems  Constitutional: Negative for  fever and unexpected weight change.  HENT: Negative for ear pain, nosebleeds, congestion, sore throat, rhinorrhea, sneezing, trouble swallowing, dental problem, postnasal drip and sinus pressure.   Eyes: Negative for redness and itching.  Respiratory: Negative for cough, chest tightness, shortness of breath and wheezing.   Cardiovascular: Negative for palpitations and leg swelling.  Gastrointestinal: Negative for nausea and vomiting.  Genitourinary: Negative for dysuria.  Musculoskeletal: Negative for joint swelling.  Skin: Negative for rash.  Neurological: Negative for headaches.  Hematological: Does not bruise/bleed easily.  Psychiatric/Behavioral:  Negative for dysphoric mood. The patient is not nervous/anxious.        Objective:   Physical Exam   Discussion only visit  Filed Vitals:   10/09/12 1614  BP: 122/82  Pulse: 89  Temp: 97.9 F (36.6 C)  TempSrc: Oral  Height: 5\' 3"  (1.6 m)  Weight: 102 lb 9.6 oz (46.539 kg)  SpO2: 91%   Alert, plesant     Assessment & Plan:

## 2012-10-12 ENCOUNTER — Telehealth: Payer: Self-pay | Admitting: Internal Medicine

## 2012-10-12 MED ORDER — PREDNISONE 10 MG PO TABS: ORAL_TABLET | ORAL | Status: DC

## 2012-10-12 NOTE — Telephone Encounter (Signed)
Prednisone Rx not sent on Friday at [facility]. Rx sent and pt is aware.Cynthia Martinez, CMA

## 2012-10-18 DIAGNOSIS — J449 Chronic obstructive pulmonary disease, unspecified: Secondary | ICD-10-CM | POA: Insufficient documentation

## 2012-10-18 NOTE — Assessment & Plan Note (Signed)
You have copd Continue advair Please start spiriva 1 puff daily - take sample, script and show technique (due to cost issues and lack of insurance will give few samples) Take prednisone 40 mg daily x 2 days, then 20mg  daily x 2 days, then 10mg  daily x 2 days, then 5mg  daily x 2 days and stop REturn in 1 months with CAT and cough score at followup   > 50% of this 15 min visit spent in face to face counseling

## 2012-11-06 ENCOUNTER — Ambulatory Visit: Payer: Self-pay | Admitting: Internal Medicine

## 2012-11-16 ENCOUNTER — Telehealth: Payer: Self-pay | Admitting: Internal Medicine

## 2012-11-16 NOTE — Telephone Encounter (Signed)
LMOMTCB x 1 

## 2012-11-17 NOTE — Telephone Encounter (Signed)
LMTCB

## 2012-11-17 NOTE — Telephone Encounter (Signed)
Pt returned triage's call.  Pt is out of town, planning a funeral & has albuterol inhaler w/ her.  Pt may not be able to answer, so if we are able to leave a message for her with an answer, she would greatly appreciate it.     Cynthia Martinez

## 2012-11-19 NOTE — Telephone Encounter (Signed)
LMTCBx2, need more details. Carron Curie, CMA

## 2012-11-20 NOTE — Telephone Encounter (Signed)
LMOM TCB x3.  Informed pt that per protocol her message will be signed and a new one will be created when she calls back with the details needed.

## 2012-11-25 ENCOUNTER — Telehealth: Payer: Self-pay | Admitting: Internal Medicine

## 2012-11-25 DIAGNOSIS — J449 Chronic obstructive pulmonary disease, unspecified: Secondary | ICD-10-CM

## 2012-11-25 NOTE — Telephone Encounter (Signed)
Spoke with pt She states out of town and can not afford spiriva (had only used samples in the past) She is using albuterol inhaler PRN and doing well, but will be gone for a little while longer and does not to get into trouble with his breathing  Wants to have rx for pred taper called in  Please advise, thanks!

## 2012-11-25 NOTE — Telephone Encounter (Signed)
LMTCB

## 2012-11-25 NOTE — Telephone Encounter (Signed)
She should just come and get spiriva or tudorza samples for time being. And then try to apply for medicaid  Also, do prednisone Please take Take prednisone 40mg  once daily x 3 days, then 30mg  once daily x 3 days, then 20mg  once daily x 3 days, then prednisone 10mg  once daily  x 3 days and stop

## 2012-11-26 MED ORDER — PREDNISONE 10 MG PO TABS: ORAL_TABLET | ORAL | Status: DC

## 2012-11-26 MED ORDER — TIOTROPIUM BROMIDE MONOHYDRATE 18 MCG IN CAPS: 18.0000 ug | ORAL_CAPSULE | Freq: Every day | RESPIRATORY_TRACT | Status: DC

## 2012-11-26 NOTE — Telephone Encounter (Signed)
Pt returned triage's call.  Holly D Pryor ° °

## 2012-11-26 NOTE — Telephone Encounter (Signed)
lmomtcb  

## 2012-11-26 NOTE — Telephone Encounter (Signed)
Pt aware of MR recs. She will have her fiance come by and p/u samples of spiriva. Also rx for pred taper has been sent to [facility]. She verbalized understanding of results. Nothing further was needed

## 2012-11-30 ENCOUNTER — Telehealth: Payer: Self-pay | Admitting: Internal Medicine

## 2012-11-30 DIAGNOSIS — J449 Chronic obstructive pulmonary disease, unspecified: Secondary | ICD-10-CM

## 2012-11-30 MED ORDER — TIOTROPIUM BROMIDE MONOHYDRATE 18 MCG IN CAPS: 18.0000 ug | ORAL_CAPSULE | Freq: Every day | RESPIRATORY_TRACT | Status: DC

## 2012-11-30 NOTE — Telephone Encounter (Signed)
Spoke with pt requesting rx for sprivia be called in rx called also  Called pharmacy and they do have pts prednisone. Pt is aware an nothing further was needed.

## 2012-12-13 ENCOUNTER — Emergency Department (INDEPENDENT_AMBULATORY_CARE_PROVIDER_SITE_OTHER)
Admission: EM | Admit: 2012-12-13 | Discharge: 2012-12-13 | Disposition: A | Discharge status: Home / Self Care | Payer: No Typology Code available for payment source | Source: Home / Self Care | Attending: Emergency Medicine | Admitting: Emergency Medicine

## 2012-12-13 ENCOUNTER — Encounter (HOSPITAL_COMMUNITY): Payer: Self-pay | Admitting: Emergency Medicine

## 2012-12-13 DIAGNOSIS — H6592 Unspecified nonsuppurative otitis media, left ear: Secondary | ICD-10-CM

## 2012-12-13 DIAGNOSIS — H6590 Unspecified nonsuppurative otitis media, unspecified ear: Secondary | ICD-10-CM

## 2012-12-13 MED ORDER — FLUTICASONE PROPIONATE 50 MCG/ACT NA SUSP: 2.0000 | Freq: Every day | NASAL | Status: DC

## 2012-12-13 MED ORDER — DOXYCYCLINE HYCLATE 100 MG PO TABS: 100.0000 mg | ORAL_TABLET | Freq: Two times a day (BID) | ORAL | Status: DC

## 2012-12-13 NOTE — ED Notes (Signed)
Pt c/o left ear pain since Wednesday. Pt denies drainage from ear.   States that she has had a productive cough with some postnasal drip.   Low grade temp off/on.  Pt denies any other symptoms.

## 2012-12-13 NOTE — ED Provider Notes (Signed)
Chief Complaint  Patient presents with  . Otalgia    left ear pain since wednesday.     History of Present Illness:   Cynthia Martinez is a 52 year old female who presents today with a five-day history of left ear congestion and decreased hearing. She also has occasional sharp stabbing pain in the ear. She denies any drainage from the ear. She's also had a five-day history of nasal congestion, rhinorrhea with yellow-green, cough productive yellow-green sputum. She denies any sore throat, postnasal drip, cough, fever, or chills.  Review of Systems:  Other than noted above, the patient denies any of the following symptoms: Systemic:  No fevers, chills, sweats, weight loss or gain, fatigue, or tiredness. Eye:  No redness, pain, discharge, itching, blurred vision, or diplopia. ENT:  No headache, nasal congestion, sneezing, itching, epistaxis, ear pain, congestion, decreased hearing, ringing in ears, vertigo, or tinnitus.  No oral lesions, sore throat, pain on swallowing, or hoarseness. Neck:  No mass, tenderness or adenopathy. Lungs:  No coughing, wheezing, or shortness of breath. Skin:  No rash or itching.  PMFSH:  Past medical history, family history, social history, meds, and allergies were reviewed.  Physical Exam:   Vital signs:  BP 141/99  Pulse 99  Temp 98.6 F (37 C) (Oral)  SpO2 95% General:  Alert and oriented.  In no distress.  Skin warm and dry. Eye:  PERRL, full EOMs, lids and conjunctiva normal.   ENT:  The left TM is retracted and and dull and appears to have fluid behind it. There is very slight erythema. The canal is clear. The right TM and canal are normal.  Nasal mucosa not congested and without drainage.  Mucous membranes moist, no oral lesions, normal dentition, pharynx clear.  No cranial or facial pain to palplation. Neck:  Supple, full ROM.  No adenopathy, tenderness or mass.  Thyroid normal. Lungs:  Breath sounds clear and equal bilaterally.  No wheezes, rales or  rhonchi. Heart:  Rhythm regular, without extrasystoles.  No gallops or murmers. Skin:  Clear, warm and dry.   Assessment:  The encounter diagnosis was Left otitis media with effusion.  Plan:   1.  The following meds were prescribed:   New Prescriptions   DOXYCYCLINE (VIBRA-TABS) 100 MG TABLET    Take 1 tablet (100 mg total) by mouth 2 (two) times daily.   FLUTICASONE (FLONASE) 50 MCG/ACT NASAL SPRAY    Place 2 sprays into the nose daily.   2.  The patient was instructed in symptomatic care and handouts were given. 3.  The patient was told to return if becoming worse in any way, if no better in 3 or 4 days, and given some red flag symptoms that would indicate earlier return.  Follow up:  The patient was told to follow up with Dr. Christia Reading if no better in 2 weeks.       Reuben Likes, MD 12/13/12 [phone]

## 2012-12-13 NOTE — ED Notes (Signed)
Waiting discharge papers 

## 2013-03-25 ENCOUNTER — Other Ambulatory Visit: Payer: Self-pay | Admitting: *Deleted

## 2013-03-25 NOTE — Telephone Encounter (Signed)
Has appt 5/13

## 2013-03-29 MED ORDER — AMLODIPINE BESYLATE 5 MG PO TABS: 5.0000 mg | ORAL_TABLET | Freq: Every day | ORAL | Status: DC

## 2013-03-30 ENCOUNTER — Encounter: Payer: Self-pay | Admitting: Internal Medicine

## 2013-04-25 ENCOUNTER — Encounter (HOSPITAL_COMMUNITY): Payer: Self-pay | Admitting: Emergency Medicine

## 2013-04-25 ENCOUNTER — Emergency Department (INDEPENDENT_AMBULATORY_CARE_PROVIDER_SITE_OTHER)
Admission: EM | Admit: 2013-04-25 | Discharge: 2013-04-25 | Disposition: A | Discharge status: Home / Self Care | Payer: BC Managed Care – PPO | Source: Home / Self Care

## 2013-04-25 DIAGNOSIS — K0889 Other specified disorders of teeth and supporting structures: Secondary | ICD-10-CM

## 2013-04-25 DIAGNOSIS — K047 Periapical abscess without sinus: Secondary | ICD-10-CM

## 2013-04-25 DIAGNOSIS — K089 Disorder of teeth and supporting structures, unspecified: Secondary | ICD-10-CM

## 2013-04-25 MED ORDER — CLINDAMYCIN HCL 300 MG PO CAPS: 300.0000 mg | ORAL_CAPSULE | Freq: Three times a day (TID) | ORAL | Status: DC

## 2013-04-25 MED ORDER — HYDROCODONE-ACETAMINOPHEN 5-325 MG PO TABS: 1.0000 | ORAL_TABLET | ORAL | Status: DC | PRN

## 2013-04-25 NOTE — ED Provider Notes (Signed)
History     CSN: [id number]  Arrival date & time 04/25/13  1100   First MD Initiated Contact with Patient 04/25/13 1122      Chief Complaint  Patient presents with  . Oral Swelling    front left tooth. swelling started late friday night.     (Consider location/radiation/quality/duration/timing/severity/associated sxs/prior treatment) HPI Comments: 52 year old female developed a toothache 48 hours ago. The pain is located in the left incisor and canine teeth. These teeth have been arrested to the gumline for years and they have been the cause of pain over time. She took Advil Friday and the pain was much better she had a pretty good day yesterday but this morning upon awakening she had swelling of the upper a along with swelling of the or gingiva associated with the cavernous teeth. There is erosion of the gingiva as well as decayed teeth. Denies fever, chills.    Past Medical History  Diagnosis Date  . Anemia   . DUB (dysfunctional uterine bleeding)   . Hypertension     Past Surgical History  Procedure Laterality Date  . Polypectomy      Family History  Problem Relation Age of Onset  . Diabetes      History  Substance Use Topics  . Smoking status: Former Smoker -- 0.50 packs/day for 9 years    Types: Cigarettes    Quit date: 07/19/2009  . Smokeless tobacco: Not on file  . Alcohol Use: No     Comment: socially    OB History   Grav Para Term Preterm Abortions TAB SAB Ect Mult Living                  Review of Systems  All other systems reviewed and are negative.    Allergies  Hydrochlorothiazide; Penicillins; and Sulfa antibiotics  Home Medications   Current Outpatient Rx  Name  Route  Sig  Dispense  Refill  . amLODipine (NORVASC) 5 MG tablet   Oral   Take 1 tablet (5 mg total) by mouth daily.   90 tablet   4   . vitamin B-12 (CYANOCOBALAMIN) 1000 MCG tablet   Oral   Take 1 tablet (1,000 mcg total) by mouth daily.   60 tablet   6   . EXPIRED:  albuterol (VENTOLIN HFA) 108 (90 BASE) MCG/ACT inhaler   Inhalation   Inhale 2 puffs into the lungs every 6 (six) hours as needed for wheezing.   6.7 g   3   . clindamycin (CLEOCIN) 300 MG capsule   Oral   Take 1 capsule (300 mg total) by mouth 3 (three) times daily. X 7 days   21 capsule   0   . doxycycline (VIBRA-TABS) 100 MG tablet   Oral   Take 1 tablet (100 mg total) by mouth 2 (two) times daily.   20 tablet   0   . fluticasone (FLONASE) 50 MCG/ACT nasal spray   Nasal   Place 2 sprays into the nose daily.   16 g   0   . Fluticasone-Salmeterol (ADVAIR DISKUS) 250-50 MCG/DOSE AEPB   Inhalation   Inhale 1 puff into the lungs 2 (two) times daily.   1 each   1     Lot #  [id number] Exp. Date 03/2013  Patient has bee ...   . HYDROcodone-acetaminophen (NORCO/VICODIN) 5-325 MG per tablet   Oral   Take 1 tablet by mouth every 4 (four) hours as needed for pain.  15 tablet   0   . metoprolol tartrate (LOPRESSOR) 25 MG tablet   Oral   Take 1 tablet (25 mg total) by mouth 2 (two) times daily.   60 tablet   11   . predniSONE (DELTASONE) 10 MG tablet      Take 40 mg daily x 3 days, 30 mg daily x 3 days, 20 mg daily x 3 days, 10 mg daily x 3 days then stop   30 tablet   0   . tiotropium (SPIRIVA) 18 MCG inhalation capsule   Inhalation   Place 1 capsule (18 mcg total) into inhaler and inhale daily.   30 capsule   3     BP 159/93  Pulse 66  Temp(Src) 98.8 F (37.1 C) (Oral)  Resp 18  SpO2 100%  Physical Exam  Nursing note and vitals reviewed. Constitutional: She is oriented to person, place, and time. She appears well-developed and well-nourished. No distress.  HENT:  Mouth/Throat: Oropharynx is clear and moist. No oropharyngeal exudate.  See description of involved teeth and gingiva in history of present illness.  Eyes: EOM are normal.  Neck: Normal range of motion. Neck supple.  Pulmonary/Chest: Effort normal.  Lymphadenopathy:    She has no cervical  adenopathy.  Neurological: She is alert and oriented to person, place, and time. She exhibits normal muscle tone.  Skin: Skin is warm and dry.    ED Course  Procedures (including critical care time)  Labs Reviewed - No data to display No results found.   1. Toothache   2. Dental abscess       MDM  Norco 5 mg one every 4 hours when necessary pain Clindamycin 300 mg 3 times a day for 7 days Followup with your dentist in 3 days as scheduled. Recheck promptly for new symptoms problems or worsening.        Hayden Rasmussen, NP 04/25/13 1137

## 2013-04-25 NOTE — ED Provider Notes (Signed)
Medical screening examination/treatment/procedure(s) were performed by non-physician practitioner and as supervising physician I was immediately available for consultation/collaboration.  Raynald Blend, MD 04/25/13 1251

## 2013-04-25 NOTE — ED Notes (Signed)
Pt c/o left front dental pain with swelling that started late Friday night.  Pt denies any drainage from gums. Pt states dental appt. Scheduled for Wednesday. Pt has used advil and Orgel with mild relief.

## 2013-04-26 ENCOUNTER — Ambulatory Visit: Payer: Self-pay | Admitting: Internal Medicine

## 2013-05-10 ENCOUNTER — Encounter: Payer: Self-pay | Admitting: Internal Medicine

## 2013-05-10 ENCOUNTER — Ambulatory Visit (INDEPENDENT_AMBULATORY_CARE_PROVIDER_SITE_OTHER): Payer: BC Managed Care – PPO | Admitting: Internal Medicine

## 2013-05-10 VITALS — BP 151/94 | HR 91 | Temp 97.8°F | Ht 63.0 in

## 2013-05-10 DIAGNOSIS — B182 Chronic viral hepatitis C: Secondary | ICD-10-CM

## 2013-05-10 DIAGNOSIS — Z23 Encounter for immunization: Secondary | ICD-10-CM

## 2013-05-10 DIAGNOSIS — R636 Underweight: Secondary | ICD-10-CM

## 2013-05-10 DIAGNOSIS — D510 Vitamin B12 deficiency anemia due to intrinsic factor deficiency: Secondary | ICD-10-CM

## 2013-05-10 DIAGNOSIS — D509 Iron deficiency anemia, unspecified: Secondary | ICD-10-CM

## 2013-05-10 LAB — CBC
HCT: 43.4 % (ref 36.0–46.0)
Hemoglobin: 14.5 g/dL (ref 12.0–15.0)
MCHC: 33.4 g/dL (ref 30.0–36.0)
MCV: 83.5 fL (ref 78.0–100.0)
Platelets: 330 10*3/uL (ref 150–400)
RBC: 5.2 MIL/uL — ABNORMAL HIGH (ref 3.87–5.11)
RDW: 14.3 % (ref 11.5–15.5)
WBC: 5.2 10*3/uL (ref 4.0–10.5)

## 2013-05-10 MED ORDER — CYANOCOBALAMIN 1000 MCG/ML IJ SOLN: INTRAMUSCULAR | Status: DC

## 2013-05-10 MED ORDER — CYANOCOBALAMIN 1000 MCG/ML IJ SOLN
1000.0000 ug | Freq: Once | INTRAMUSCULAR | Status: AC
  Administered 2013-05-10: 1000 ug via INTRAMUSCULAR

## 2013-05-10 NOTE — Progress Notes (Signed)
This is a Psychologist, occupational Note.  The care of the patient was discussed with Dr. Rogelia Boga and the assessment and plan was formulated with their assistance.  Please see their note for official documentation of the patient encounter.   Subjective:   Patient ID: Cynthia Martinez female   DOB: [birth year] 52 y.o.   MRN: [id number]  HPI: Cynthia. Cynthia Martinez is a 52 y.o. female with a history of pernicious anemia and chronic hepatitis C presenting for evaluation of fatigue. Cynthia Martinez reports that she has been suffering from fatigue for the past few weeks. The fatigue is pretty constant and is noticed by her co-workers. She recently start a new job 2 months ago that requires her to get up earlier but reports that this fatigue is above "just sleepiness". She has not had B12 injections in a number of months now and has also not taken B12 oral supplements recently, complaining that these supplements make her constipated. She is also off of iron supplements for similar reasons. She denies hematemesis, hemoptysis, hematuria, blood per rectum, or melena. She denies easy bruising. She has not significantly changed her diet recently and reports that she is eating well but has a better appetite on B12 injection. She requests a B12 injection today and asks if there are any iron supplements that are easier on her stomach.  Patient has a history of being underweight and relates that this is constant since childhood, having reached a maximum of 126 lbs at some point in her life.  With respect to her chronic hepatitis C, the patient expresses a desire to follow up with her previous physicians or a new group now that she has [facility] and has heard about a new therapy available.    Past Medical History  Diagnosis Date  . Anemia   . DUB (dysfunctional uterine bleeding)   . Hypertension    Current Outpatient Prescriptions  Medication Sig Dispense Refill  . amLODipine (NORVASC) 5 MG tablet Take 1 tablet  (5 mg total) by mouth daily.  90 tablet  4  . HYDROcodone-acetaminophen (NORCO/VICODIN) 5-325 MG per tablet Take 1 tablet by mouth every 4 (four) hours as needed for pain.  15 tablet  0  . metoprolol tartrate (LOPRESSOR) 25 MG tablet Take 1 tablet (25 mg total) by mouth 2 (two) times daily.  60 tablet  11  . albuterol (VENTOLIN HFA) 108 (90 BASE) MCG/ACT inhaler Inhale 2 puffs into the lungs every 6 (six) hours as needed for wheezing.  6.7 g  3  . cyanocobalamin (,VITAMIN B-12,) 1000 MCG/ML injection Inject 1 mL ( ) into muscle once daily for 7 days, then weekly for 4 weeks, then monthly.  25 mL  0  . fluticasone (FLONASE) 50 MCG/ACT nasal spray Place 2 sprays into the nose daily.  16 g  0  . vitamin B-12 (CYANOCOBALAMIN) 1000 MCG tablet Take 1 tablet (1,000 mcg total) by mouth daily.  60 tablet  6   Current Facility-Administered Medications  Medication Dose Route Frequency Provider Last Rate Last Dose  . cyanocobalamin ((VITAMIN B-12)) injection 1,000 mcg  1,000 mcg Intramuscular Once Burns Spain, MD       Family History  Problem Relation Age of Onset  . Diabetes     History   Social History  . Marital Status: Single    Spouse Name: N/A    Number of Children: N/A  . Years of Education: N/A   Social History Main Topics  .  Smoking status: Former Smoker -- 0.50 packs/day for 9 years    Types: Cigarettes    Quit date: 07/19/2009  . Smokeless tobacco: None  . Alcohol Use: No     Comment: socially  . Drug Use: No  . Sexually Active: Yes -- Female partner(s)    Birth Control/ Protection: Condom   Other Topics Concern  . None   Social History Narrative   Lives with fiancee in [place]   2 daughter in 20's and 30'3.   Worked as Advertising copywriter in past.            Review of Systems: A comprehensive 12 point review of systems was performed and is negative except as stated above. Objective:  Physical Exam: Filed Vitals:   05/10/13 1540  BP: 151/94  Pulse: 91  Temp: 97.8  F (36.6 C)  TempSrc: Oral  Height: 5\' 3"  (1.6 m)  SpO2: 94%   General appearance: alert, cooperative and no distress Head: Normocephalic, without obvious abnormality, atraumatic Eyes: anicteric sclera. PERRL, EOMI.  Throat: Poor dentition with a left upper molar (14 or 15) severe dental carries and medial half of tooth absent. Neck: no adenopathy, supple, symmetrical, trachea midline and thyroid not enlarged, symmetric, no tenderness/mass/nodules Lungs: clear to auscultation bilaterally and normal work of breathing. Heart: regular rate and rhythm, S1, S2 normal, no murmur, click, rub or gallop Abdomen: soft, non-tender; bowel sounds normal; no masses,  no organomegaly Extremities: extremities normal, atraumatic, no cyanosis or edema Pulses: 2+ and symmetric radial pulses Assessment & Plan:  Cynthia Martinez is a 52 y.o. female with a history of pernicious anemia off B12 supplementation, low weight, and chronic hepatitis C presenting with multiple weeks of fatigue.  Pernicious anemia Lab Results  Component Value Date   VITAMINB12 646 02/18/2012   VITAMINB12 76* 12/17/2011   VITAMINB12 104* 12/15/2011   History of pernicious anemia with antibody confirmation. Patient has not been on B12 injections in "months" and reports that she did not like the constipation that she developed from B12 oral supplements. She reports fatigue for the past few weeks without easy bruising, blood loss, or melena. - B12 injection today - Rx for home B12 injections. - Ferritin level and CBC without diff - Would consider adding iron gluconate tablets in the future, starting at low dose of 28 mg given her previous history of GI disturbance on iron supplements.  Chronic hepatitis C without mention of hepatic coma History of Hep C managed by Drs. Orvan Falconer and Jacqualine Mau in the past. Patient does not remember being vaccinated for Hep A or B and was antibody negative 12/15/2011. - Hep A/B combination vaccine today in  clinic. - Patient will need to follow up in one month for her next vaccine in the series.  COPD (chronic obstructive pulmonary disease) Patient has discontinued Advair and Spiriva, having only taken these when given them as samples since they were too expensive. She has never tried to obtain these medications since getting BCBS coverage. She does not complain of any shortness of breath or difficulty breathing. She is using her albuterol inhaler about once a week right now. Former smoker quit 2010.    Health Maintenance - Recommended colonoscopy. Patient will think about this prior to her next visit.  FOLLOW UP: Return visit in 6 months. Return if problem recurs,  worsens, or new problem develops.

## 2013-05-10 NOTE — Assessment & Plan Note (Addendum)
Lab Results  Component Value Date   VITAMINB12 646 02/18/2012   VITAMINB12 76* 12/17/2011   VITAMINB12 104* 12/15/2011   History of pernicious anemia with antibody confirmation. Patient has not been on B12 injections in "months" and reports that she did not like the constipation that she developed from B12 oral supplements. She reports fatigue for the past few weeks without easy bruising, blood loss, or melena. - B12 injection today - Rx for home B12 injections. - Ferritin level and CBC without diff - Would consider adding iron gluconate tablets in the future, starting at low dose of 28 mg given her previous history of GI disturbance on iron supplements.  ATTENDING A&P: She can obtain a nl Vit B12 on IM injections. She has an intolerance to the PO pills. Therefore, resume IM B 12. She can start QD x 7, then weekly x 4, then monthly. There is no need to check B12 as it will not change the tx rec today. Will check a HgB.  She has no fam hx or signs / sxs of a malabsorption syndrome.

## 2013-05-10 NOTE — Assessment & Plan Note (Signed)
Patient has discontinued Advair and Spiriva, having only taken these when given them as samples since they were too expensive. She has never tried to obtain these medications since getting BCBS coverage. She does not complain of any shortness of breath or difficulty breathing. She is using her albuterol inhaler about once a week right now. Former smoker quit 2010.

## 2013-05-10 NOTE — Patient Instructions (Addendum)
Make a nursing visit appointment in 1 month to follow up for your next vaccine. Follow up with your primary care doctor in 6 months.  You were seen in the Internal Medicine Clinic today for fatigue.  For your fatigue, we gave you a B12 injection and have started you on a monthly B12 injections. We are also sending you to the lab today for blood work to look at your iron and blood levels. We will contact you in 3 days if there are any abnormalities.  With your history of Hepatitis C, we have recommended that you get vaccinated for Hepatitis A and B. You received your first vaccination in this series today and will need to follow up in 1 month for the next one.  We spoke briefly today about the importance of colon cancer screening in men and women over age 75. We would like for you to think about this and seriously consider a colonoscopy in the near future, as this would be the recommendation for screening.   Colorectal Cancer Screening Colorectal cancer screening is done to detect early disease. Colorectal refers to the colon and rectum. The colon and rectum are located at the end of the large intestine (digestive system), and carry your bowel movements out of the body. Screening may be done even if you are not experiencing symptoms.  Colorectal cancer screening checks for:  Polyps. These are small growths in the lining of the colon that can turn cancerous.  Cancer that is already growing. Cancer is a cluster of abnormal cells that can cause problems in the body. REASONS FOR COLORECTAL CANCER SCREENING  It is common for polyps to form in the lining of the colon, especially in older people. These polyps can be cancerous or become cancerous.  Caught early, colorectal cancer is treatable.  Cancer can be life threatening. Detecting or preventing cancer early can save your life and allow you to enjoy life longer. TYPES OF SCREENING  Fecal occult blood testing. A stool sample is examined for  blood in the laboratory.  Sigmoidoscopy. A sigmoidoscope is used to examine the rectum and lower colon. A sigmoidoscope is a flexible tube with a camera that is inserted through your anus to examine your lower rectum.  Colonoscopy. The longer colonoscope is used to examine the entire colon. A colonoscope is also a thin, flexible tube with a camera. This test examines the colon and rectum. Other tests include:  Digital rectal exam.  Barium enema.  Stool DNA test.  Virtual colonoscopy is the use of computerized X-ray scan (computed tomography, CT) to take X-ray images of your colon. WHO SHOULD HAVE COLORECTAL CANCER SCREENING?  Screening is recommended for all adults aged 29 to 75 years.  Screening is generally done every 5 to 10 years or more frequently if you have a family history or symptoms.  Screening is rarely recommended in adults aged 44 to 85 years. Screening is not recommended in adults aged 64 years and older. Your caregiver may recommend screening at a younger age and more frequent screening if you have:  A history of colorectal cancer or polyps.  Family members with histories of colorectal cancer or polyps.  Inflammatory bowel disease, such as ulcerative colitis or Crohn's disease.  A type of hereditary colon cancer syndrome. Talk with your caregiver about any symptoms, personal and family history. SYMPTOMS OF COLORECTAL CANCER It is important to discuss the following symptoms with your caregiver. These symptoms may be the result of other conditions and may  be easily treated:  Rectal bleeding.  Blood in your stool.  Changes in bowel movements (hard or loose stools). These changes may last several weeks.  Abdominal cramping.  Feeling the pressure to have a bowel movement when there is no bowel movement.  Feeling tired or weak.  Unexplained weight loss.  Unexplained low red blood cell count. This may also be called iron deficiency anemia. HOME CARE INSTRUCTIONS    Follow up with your caregiver as directed.  Follow all instructions for preparation before your test as well as after. PREVENTION  Following healthy lifestyle habits each day can reduce your chance of getting colorectal cancer and many other types of cancer:  Eat a healthy, well-balanced diet rich in fruits and vegetables and low in fats, sugars and cholesterol.  Stay active. Try to exercise at least 4 to 6 times per week for 30 minutes.  Maintain a healthy weight. Ask your caregiver what a healthy weight range is for you.  Women should only drink 1 alcoholic drink per day. Men should only drink 2 alcoholic drinks per day.  Quit smoking. SEEK MEDICAL CARE IF:   You experience abdominal or rectal symptoms (see Symptoms of Colorectal Cancer).  Your gastrointestinal issues (constipation, diarrhea) do not go away as expected.  You have questions or concerns. FOR MORE INFORMATION  American Academy of Family Physicians www.familydoctor.org  Centers for Disease Control and Prevention FootballExhibition.com.br  [place] Preventive Services Task Force www.uspreventiveservicestaskforce.org  American Cancer Society www.cancer.org MAKE SURE YOU:   Understand these instructions.  Will watch your condition.  Will get help right away if you are not doing well or get worse. Always follow up with your caregiver to find out the results of your tests. Not all test results may be available during your visit. If your test results are not back during the visit, make an appointment with your caregiver to find out the results. Do not assume everything is normal if you have not heard from your caregiver or the medical facility. It is important for you to follow up on all of your test results.  Document Released: 04/24/2010 Document Revised: 01/27/2012 Document Reviewed: 04/24/2010 [facility] Patient Information 2014 [place], [place].

## 2013-05-10 NOTE — Assessment & Plan Note (Addendum)
History of Hep C managed by Drs. Orvan Falconer and Jacqualine Mau in the past. Patient does not remember being vaccinated for Hep A or B and was antibody negative 12/15/2011. - Hep A/B combination vaccine today in clinic. - Patient will need to follow up in one month for her next vaccine in the series.   ATTENDING A&P: We will refer to another hepatology clinic for assessment. Start the Hep A & B vaccine series.

## 2013-05-11 DIAGNOSIS — Z862 Personal history of diseases of the blood and blood-forming organs and certain disorders involving the immune mechanism: Secondary | ICD-10-CM | POA: Insufficient documentation

## 2013-05-11 LAB — FERRITIN: Ferritin: 74 ng/mL (ref 10–291)

## 2013-05-11 NOTE — Progress Notes (Signed)
  Subjective:    Patient ID: Cynthia Martinez, female    DOB: 08/23/1961, 52 y.o.   MRN: [id number]  HPI  Cynthia Martinez is a 52 yo with several issues today.  1. Vit B12 def - she was dx with pernicious anemia and her B 12 level was 76 1 yr ago. She went on B 12 shots and did quite well but transitioned to oral pills and the fatigue worsened a few weeks ago. Coworkers have noticed. She started a new job that is physically demanding but that she enjoys. She feels that she could doze off in the exam room. She also feels that the iron pills are causing constipation and has recently stopped them. She wants to resume the IM shots and her husband can give them to her.  2. Iron deficiency - Her ferritin was 3 5 yrs ago. She had heavy menses at that time. She denies melena, PRBPR, hematuria, coughing up blood. She has never had colon cancer screening but is willing to consider it.  3. Hep C, chronic - She saw Dr Chrystie Nose but prefers another provider. She never got the Hep A or B vaccine. She desires to start the vaccination series and wants to see another hepatologist.   Review of Systems  Constitutional: Positive for activity change and fatigue.  Respiratory:       No hemoptysis  Gastrointestinal: Negative for diarrhea and blood in stool.  Genitourinary: Negative for hematuria.  Hematological: Does not bruise/bleed easily.  Psychiatric/Behavioral: Positive for sleep disturbance.       Objective:   Physical Exam  Constitutional: She is oriented to person, place, and time. She appears well-developed and well-nourished. No distress.  HENT:  Head: Normocephalic and atraumatic.  Right Ear: External ear normal.  Left Ear: External ear normal.  Nose: Nose normal.  Eyes: Conjunctivae and EOM are normal. Right eye exhibits no discharge. Left eye exhibits no discharge. No scleral icterus.  Pulmonary/Chest: Effort normal.  Neurological: She is alert and oriented to person, place, and time.  Skin: She is not  diaphoretic.  Psychiatric: She has a normal mood and affect. Her behavior is normal. Judgment and thought content normal.          Assessment & Plan:

## 2013-05-11 NOTE — Assessment & Plan Note (Signed)
Check HgB and ferratin as these levels will influence tx rec. Ferritin is 74 so can not resume supplementation at this time. However if fatigue doesn't resolve with B12, would need to resume ferritin and check TSH and other labs.

## 2013-05-24 ENCOUNTER — Ambulatory Visit: Payer: Self-pay | Admitting: Obstetrics

## 2013-05-28 ENCOUNTER — Telehealth: Payer: Self-pay | Admitting: *Deleted

## 2013-05-28 NOTE — Telephone Encounter (Signed)
Pt aware Dr Rogelia Boga is pleased with labs 05/10/13. Stanton Kidney Kristianna Saperstein RN 05/28/13 1:30PM

## 2013-07-21 ENCOUNTER — Ambulatory Visit: Payer: Self-pay | Admitting: Obstetrics

## 2013-07-21 ENCOUNTER — Encounter: Payer: Self-pay | Admitting: Internal Medicine

## 2013-07-22 ENCOUNTER — Ambulatory Visit: Payer: Self-pay | Admitting: Obstetrics

## 2013-08-05 ENCOUNTER — Encounter: Payer: Self-pay | Admitting: Obstetrics

## 2013-08-05 ENCOUNTER — Ambulatory Visit (INDEPENDENT_AMBULATORY_CARE_PROVIDER_SITE_OTHER): Payer: BC Managed Care – PPO | Admitting: Obstetrics

## 2013-08-05 VITALS — BP 167/117 | HR 81 | Temp 98.4°F | Ht 63.0 in | Wt 103.4 lb

## 2013-08-05 DIAGNOSIS — N760 Acute vaginitis: Secondary | ICD-10-CM

## 2013-08-05 DIAGNOSIS — Z01419 Encounter for gynecological examination (general) (routine) without abnormal findings: Secondary | ICD-10-CM

## 2013-08-05 LAB — CBC WITH DIFFERENTIAL/PLATELET
HCT: 45.4 % (ref 36.0–46.0)
Hemoglobin: 15.2 g/dL — ABNORMAL HIGH (ref 12.0–15.0)
Lymphs Abs: 2.4 10*3/uL (ref 0.7–4.0)
Monocytes Absolute: 0.5 10*3/uL (ref 0.1–1.0)
Monocytes Relative: 8 % (ref 3–12)
Neutro Abs: 3.1 10*3/uL (ref 1.7–7.7)
Neutrophils Relative %: 52 % (ref 43–77)
RBC: 5.47 MIL/uL — ABNORMAL HIGH (ref 3.87–5.11)

## 2013-08-05 LAB — COMPREHENSIVE METABOLIC PANEL
ALT: 31 U/L (ref 0–35)
Albumin: 4.5 g/dL (ref 3.5–5.2)
CO2: 29 mEq/L (ref 19–32)
Calcium: 9.8 mg/dL (ref 8.4–10.5)
Chloride: 100 mEq/L (ref 96–112)
Potassium: 3.9 mEq/L (ref 3.5–5.3)
Sodium: 138 mEq/L (ref 135–145)
Total Protein: 8 g/dL (ref 6.0–8.3)

## 2013-08-05 MED ORDER — PRENATAL PLUS 27-1 MG PO TABS: 1.0000 | ORAL_TABLET | Freq: Every day | ORAL | Status: DC

## 2013-08-05 NOTE — Progress Notes (Signed)
.   Subjective:     Cynthia Martinez is a 52 y.o. female here for a routine exam.  Current complaints: Patient would like something for hot flashes.  Personal health questionnaire reviewed: yes.   Gynecologic History No LMP recorded. Patient is postmenopausal. Contraception: none Last Pap: 2013. Results were: normal Last mammogram: couple years ago. Results were: normal  Obstetric History OB History  No data available     The following portions of the patient's history were reviewed and updated as appropriate: allergies, current medications, past family history, past medical history, past social history, past surgical history and problem list.  Review of Systems Pertinent items are noted in HPI.    Objective:    General appearance: alert and no distress Breasts: normal appearance, no masses or tenderness Abdomen: normal findings: soft, non-tender Pelvic: cervix normal in appearance, external genitalia normal, no adnexal masses or tenderness, no cervical motion tenderness, uterus normal size, shape, and consistency and vagina normal without discharge    Assessment:    Healthy female exam.    Plan:    Mammogram ordered. Follow up in: 1 year.

## 2013-08-06 ENCOUNTER — Encounter: Payer: Self-pay | Admitting: Obstetrics

## 2013-08-06 DIAGNOSIS — N760 Acute vaginitis: Secondary | ICD-10-CM | POA: Insufficient documentation

## 2013-08-06 LAB — GC/CHLAMYDIA PROBE AMP
CT Probe RNA: NEGATIVE
GC Probe RNA: NEGATIVE

## 2013-08-06 LAB — WET PREP BY MOLECULAR PROBE: Candida species: NEGATIVE

## 2013-08-06 LAB — PAP IG W/ RFLX HPV ASCU

## 2013-08-06 LAB — TSH: TSH: 2.242 u[IU]/mL (ref 0.350–4.500)

## 2013-08-24 ENCOUNTER — Other Ambulatory Visit: Payer: Self-pay | Admitting: *Deleted

## 2013-08-24 MED ORDER — OB COMPLETE ONE 50-1-476 MG PO CAPS: 1.0000 | ORAL_CAPSULE | Freq: Every day | ORAL | Status: DC

## 2013-09-17 ENCOUNTER — Other Ambulatory Visit: Payer: Self-pay | Admitting: *Deleted

## 2013-09-18 MED ORDER — METOPROLOL TARTRATE 25 MG PO TABS: 25.0000 mg | ORAL_TABLET | Freq: Two times a day (BID) | ORAL | Status: DC

## 2013-09-29 ENCOUNTER — Ambulatory Visit: Payer: BC Managed Care – PPO | Admitting: Podiatrist

## 2013-12-03 ENCOUNTER — Other Ambulatory Visit: Payer: Self-pay | Admitting: *Deleted

## 2013-12-03 MED ORDER — METOPROLOL TARTRATE 25 MG PO TABS: 25.0000 mg | ORAL_TABLET | Freq: Two times a day (BID) | ORAL | Status: DC

## 2013-12-03 MED ORDER — AMLODIPINE BESYLATE 5 MG PO TABS: 5.0000 mg | ORAL_TABLET | Freq: Every day | ORAL | Status: DC

## 2013-12-07 NOTE — Telephone Encounter (Signed)
Refills faxed in

## 2014-03-30 ENCOUNTER — Emergency Department (HOSPITAL_COMMUNITY)
Admission: EM | Admit: 2014-03-30 | Discharge: 2014-03-30 | Disposition: A | Discharge status: Home / Self Care | Payer: BC Managed Care – PPO | Attending: Emergency Medicine | Admitting: Emergency Medicine

## 2014-03-30 ENCOUNTER — Encounter (HOSPITAL_COMMUNITY): Payer: Self-pay | Admitting: Emergency Medicine

## 2014-03-30 ENCOUNTER — Emergency Department (HOSPITAL_COMMUNITY): Payer: BC Managed Care – PPO

## 2014-03-30 DIAGNOSIS — Z880 Allergy status to penicillin: Secondary | ICD-10-CM | POA: Insufficient documentation

## 2014-03-30 DIAGNOSIS — J441 Chronic obstructive pulmonary disease with (acute) exacerbation: Secondary | ICD-10-CM

## 2014-03-30 DIAGNOSIS — IMO0002 Reserved for concepts with insufficient information to code with codable children: Secondary | ICD-10-CM | POA: Insufficient documentation

## 2014-03-30 DIAGNOSIS — Z79899 Other long term (current) drug therapy: Secondary | ICD-10-CM | POA: Insufficient documentation

## 2014-03-30 DIAGNOSIS — Z87891 Personal history of nicotine dependence: Secondary | ICD-10-CM | POA: Insufficient documentation

## 2014-03-30 DIAGNOSIS — I10 Essential (primary) hypertension: Secondary | ICD-10-CM | POA: Insufficient documentation

## 2014-03-30 DIAGNOSIS — Z8742 Personal history of other diseases of the female genital tract: Secondary | ICD-10-CM | POA: Insufficient documentation

## 2014-03-30 DIAGNOSIS — H9209 Otalgia, unspecified ear: Secondary | ICD-10-CM | POA: Insufficient documentation

## 2014-03-30 DIAGNOSIS — D649 Anemia, unspecified: Secondary | ICD-10-CM | POA: Insufficient documentation

## 2014-03-30 HISTORY — DX: Chronic viral hepatitis C: B18.2

## 2014-03-30 MED ORDER — AZITHROMYCIN 250 MG PO TABS: 250.0000 mg | ORAL_TABLET | Freq: Every day | ORAL | Status: DC

## 2014-03-30 NOTE — ED Notes (Signed)
Patient currently in xray.  Patient was not brought directly back to room.

## 2014-03-30 NOTE — Discharge Instructions (Signed)
Take Azithromycin as directed until gone. Continue to take Delsym for cough. Follow up with your doctor for further evaluation.

## 2014-03-30 NOTE — ED Notes (Signed)
Pt states her body is achy and she is coughing up yellow phlegm. Pt is in NAD, AAOX4. Skin warm and dry.

## 2014-03-30 NOTE — ED Provider Notes (Signed)
CSN: [id number]     Arrival date & time 03/30/14  1028 History  This chart was scribed for non-physician practitioner working with Tanna Furry, MD, by Erling Conte, ED Scribe. This patient was seen in room TR09C/TR09C and the patient's care was started at 11:57 AM.     Chief Complaint  Patient presents with  . Cough     Patient is a 53 y.o. female presenting with cough. The history is provided by the patient. No language interpreter was used.  Cough Cough characteristics:  Productive Sputum characteristics:  Yellow Severity:  Mild Duration:  3 days Timing:  Intermittent Progression:  Unchanged Chronicity:  New Smoker: former.   Relieved by: Delsym (mild relief) Worsened by:  Nothing tried Associated symptoms: chills, diaphoresis, fever (t-max 100.6 F), headaches, myalgias and sinus congestion  Ear pain: left.    HPI Comments: Cynthia Martinez is a 53 y.o. female with a h/o of COPD who presents to the Emergency Department complaining of a intermittent, mild cough productive of yellow sputum for the past 3 days. Patient states she is having associated fatigue, generalized body aches, nasal congestion, headache, chills, diaphoresis, left ear pain and fever. Patient states her fever was last night was 100.6 F. Patient states she took Tylenol and that provided relief for the fever. Patient states she has been taking Delsym with mild relief for the cough. Patient is former smoker.   Past Medical History  Diagnosis Date  . Anemia   . DUB (dysfunctional uterine bleeding)   . Hypertension   . Hep C w/o coma, chronic    Past Surgical History  Procedure Laterality Date  . Polypectomy     Family History  Problem Relation Age of Onset  . Diabetes     History  Substance Use Topics  . Smoking status: Former Smoker -- 0.50 packs/day for 9 years    Types: Cigarettes    Quit date: 07/19/2009  . Smokeless tobacco: Not on file  . Alcohol Use: No     Comment: socially   OB History   Grav Para Term Preterm Abortions TAB SAB Ect Mult Living                 Review of Systems  Constitutional: Positive for fever (t-max 100.6 F), chills, diaphoresis and fatigue.  HENT: Ear pain: left.   Respiratory: Positive for cough.   Musculoskeletal: Positive for myalgias.  Neurological: Positive for headaches.  All other systems reviewed and are negative.     Allergies  Hydrochlorothiazide; Penicillins; and Sulfa antibiotics  Home Medications   Prior to Admission medications   Medication Sig Start Date End Date Taking? Authorizing Provider  albuterol (VENTOLIN HFA) 108 (90 BASE) MCG/ACT inhaler Inhale 2 puffs into the lungs every 6 (six) hours as needed for wheezing. 01/07/12 01/06/13  Coralee Pesa, MD  amLODipine (NORVASC) 5 MG tablet Take 1 tablet (5 mg total) by mouth daily. 12/03/13 12/03/14  Juluis Mire, MD  cyanocobalamin (,VITAMIN B-12,) 1000 MCG/ML injection Inject 1 mL (1071mcg) into muscle once daily for 7 days, then weekly for 4 weeks, then monthly. 05/10/13   Bartholomew Crews, MD  fluticasone (FLONASE) 50 MCG/ACT nasal spray Place 2 sprays into the nose daily. 12/13/12   Harden Mo, MD  HYDROcodone-acetaminophen (NORCO/VICODIN) 5-325 MG per tablet Take 1 tablet by mouth every 4 (four) hours as needed for pain. 04/25/13   Janne Napoleon, NP  metoprolol tartrate (LOPRESSOR) 25 MG tablet Take 1 tablet (25 mg total)  by mouth 2 (two) times daily. 12/03/13 12/03/14  Juluis Mire, MD  Prenat-FeCbn-FeAspGl-FA-Fish (OB COMPLETE ONE) 50-1-476 MG CAPS Take 1 capsule by mouth daily. 08/24/13   Shelly Bombard, MD  prenatal vitamin w/FE, FA (PRENATAL 1 + 1) 27-1 MG TABS tablet Take 1 tablet by mouth daily at 12 noon. 08/05/13   Shelly Bombard, MD   Triage Vitals: BP 159/109  Pulse 99  Temp(Src) 98.2 F (36.8 C) (Oral)  Resp 16  Ht 5\' 3"  (1.6 m)  Wt 108 lb (48.988 kg)  BMI 19.14 kg/m2  SpO2 93%  Physical Exam  Nursing note and vitals reviewed. Constitutional: She is  oriented to person, place, and time. She appears well-developed and well-nourished. No distress.  HENT:  Head: Normocephalic and atraumatic.  Right Ear: External ear normal.  Left Ear: External ear normal.  Nose: Nose normal.  Mouth/Throat: Oropharynx is clear and moist.  Eyes: Conjunctivae are normal.  Neck: Normal range of motion. Neck supple.  Cardiovascular: Normal rate.   Pulmonary/Chest: Effort normal.  Crackles at bilateral bases of lungs  Abdominal: Soft.  Musculoskeletal: Normal range of motion.  Neurological: She is alert and oriented to person, place, and time.  Skin: Skin is warm and dry. She is not diaphoretic.  Psychiatric: She has a normal mood and affect.    ED Course  Procedures (including critical care time)  DIAGNOSTIC STUDIES: Oxygen Saturation is 93% on RA, adequate by my interpretation.    COORDINATION OF CARE: 10:51 AM- Will order CXR. Pt advised of plan for treatment and pt agrees.  12:03 PM- Will discharge patient with Zithromax Z-pak.  Pt advised of plan for treatment and pt agrees.     Labs Review Labs Reviewed - No data to display  Imaging Review Dg Chest 2 View  03/30/2014   CLINICAL DATA:  Productive cough, shortness of breath with history of previous tobacco use  EXAM: CHEST  2 VIEW  COMPARISON:  DG CHEST 2 VIEW dated 08/15/2012  FINDINGS: The lungs are hyperinflated with hemidiaphragm flattening. There is no focal infiltrate. The cardiopericardial silhouette is normal in size. The pulmonary vascularity is not engorged. The mediastinum is normal in width. There is no pleural effusion or pneumothorax or pneumomediastinum. There is gentle curvature of the mid thoracic spine with the convexity towards the right.  IMPRESSION: There is hyperinflation consistent with COPD. There is no focal pneumonia. One cannot exclude acute bronchitis in the appropriate clinical setting.   Electronically Signed   By: David  Martinique   On: 03/30/2014 11:41     EKG  Interpretation None      MDM   Final diagnoses:  COPD exacerbation   Patient's chest xray unremarkable. Patient likely having mild COPD exacerbation. Vitals stable and patient afebrile. Patient will have azithromycin. Patient advised to continue delsym at home. Patient advised to follow up with PCP for further evaluation.   I personally performed the services described in this documentation, which was scribed in my presence. The recorded information has been reviewed and is accurate.     Alvina Chou, PA-C 03/30/14 1235

## 2014-03-30 NOTE — ED Notes (Signed)
Pt returned from xray

## 2014-03-30 NOTE — ED Notes (Signed)
Headaches and nasal congestion and left ear pain. Coughing up yellow mucous. Body aches. Took tylenol and fever reduced.

## 2014-04-05 NOTE — ED Provider Notes (Signed)
Medical screening examination/treatment/procedure(s) were performed by non-physician practitioner and as supervising physician I was immediately available for consultation/collaboration.   EKG Interpretation None        Tanna Furry, MD 04/05/14 [phone]

## 2014-05-26 ENCOUNTER — Other Ambulatory Visit: Payer: Self-pay | Admitting: Nurse Practitioner

## 2014-05-26 DIAGNOSIS — C220 Liver cell carcinoma: Secondary | ICD-10-CM

## 2014-06-03 ENCOUNTER — Other Ambulatory Visit: Payer: BC Managed Care – PPO

## 2014-06-09 ENCOUNTER — Emergency Department (HOSPITAL_COMMUNITY)
Admission: EM | Admit: 2014-06-09 | Discharge: 2014-06-09 | Disposition: A | Discharge status: Home / Self Care | Payer: BC Managed Care – PPO | Attending: Emergency Medicine | Admitting: Emergency Medicine

## 2014-06-09 ENCOUNTER — Encounter (HOSPITAL_COMMUNITY): Payer: Self-pay | Admitting: Emergency Medicine

## 2014-06-09 ENCOUNTER — Emergency Department (HOSPITAL_COMMUNITY): Payer: BC Managed Care – PPO

## 2014-06-09 DIAGNOSIS — Z880 Allergy status to penicillin: Secondary | ICD-10-CM | POA: Insufficient documentation

## 2014-06-09 DIAGNOSIS — S9030XA Contusion of unspecified foot, initial encounter: Secondary | ICD-10-CM | POA: Insufficient documentation

## 2014-06-09 DIAGNOSIS — Z8619 Personal history of other infectious and parasitic diseases: Secondary | ICD-10-CM | POA: Insufficient documentation

## 2014-06-09 DIAGNOSIS — Z862 Personal history of diseases of the blood and blood-forming organs and certain disorders involving the immune mechanism: Secondary | ICD-10-CM | POA: Insufficient documentation

## 2014-06-09 DIAGNOSIS — Z8742 Personal history of other diseases of the female genital tract: Secondary | ICD-10-CM | POA: Insufficient documentation

## 2014-06-09 DIAGNOSIS — S99919A Unspecified injury of unspecified ankle, initial encounter: Secondary | ICD-10-CM

## 2014-06-09 DIAGNOSIS — S99929A Unspecified injury of unspecified foot, initial encounter: Secondary | ICD-10-CM

## 2014-06-09 DIAGNOSIS — Y939 Activity, unspecified: Secondary | ICD-10-CM | POA: Insufficient documentation

## 2014-06-09 DIAGNOSIS — Z87891 Personal history of nicotine dependence: Secondary | ICD-10-CM | POA: Insufficient documentation

## 2014-06-09 DIAGNOSIS — Z792 Long term (current) use of antibiotics: Secondary | ICD-10-CM | POA: Insufficient documentation

## 2014-06-09 DIAGNOSIS — Y929 Unspecified place or not applicable: Secondary | ICD-10-CM | POA: Insufficient documentation

## 2014-06-09 DIAGNOSIS — I10 Essential (primary) hypertension: Secondary | ICD-10-CM | POA: Insufficient documentation

## 2014-06-09 DIAGNOSIS — S8990XA Unspecified injury of unspecified lower leg, initial encounter: Secondary | ICD-10-CM | POA: Insufficient documentation

## 2014-06-09 DIAGNOSIS — S9031XA Contusion of right foot, initial encounter: Secondary | ICD-10-CM

## 2014-06-09 DIAGNOSIS — W230XXA Caught, crushed, jammed, or pinched between moving objects, initial encounter: Secondary | ICD-10-CM | POA: Insufficient documentation

## 2014-06-09 DIAGNOSIS — Z79899 Other long term (current) drug therapy: Secondary | ICD-10-CM | POA: Insufficient documentation

## 2014-06-09 MED ORDER — OXYCODONE-ACETAMINOPHEN 5-325 MG PO TABS
1.0000 | ORAL_TABLET | Freq: Once | ORAL | Status: AC
  Administered 2014-06-09: 1 via ORAL
  Filled 2014-06-09: qty 1

## 2014-06-09 MED ORDER — IBUPROFEN 400 MG PO TABS: 400.0000 mg | ORAL_TABLET | Freq: Four times a day (QID) | ORAL | Status: DC | PRN

## 2014-06-09 MED ORDER — TRAMADOL HCL 50 MG PO TABS: 50.0000 mg | ORAL_TABLET | Freq: Four times a day (QID) | ORAL | Status: DC | PRN

## 2014-06-09 NOTE — Discharge Instructions (Signed)
Contusion °A contusion is a deep bruise. Contusions happen when an injury causes bleeding under the skin. Signs of bruising include pain, puffiness (swelling), and discolored skin. The contusion may turn blue, purple, or yellow. °HOME CARE  °· Put ice on the injured area. °¨ Put ice in a plastic bag. °¨ Place a towel between your skin and the bag. °¨ Leave the ice on for 15-20 minutes, 03-04 times a day. °· Only take medicine as told by your doctor. °· Rest the injured area. °· If possible, raise (elevate) the injured area to lessen puffiness. °GET HELP RIGHT AWAY IF:  °· You have more bruising or puffiness. °· You have pain that is getting worse. °· Your puffiness or pain is not helped by medicine. °MAKE SURE YOU:  °· Understand these instructions. °· Will watch your condition. °· Will get help right away if you are not doing well or get worse. °Document Released: 04/22/2008 Document Revised: 01/27/2012 Document Reviewed: 09/09/2011 °Exit[employer]® Patient Information ©2015 [employer]. This information is not intended to replace advice given to you by your health care provider. Make sure you discuss any questions you have with your health care provider. ° °

## 2014-06-09 NOTE — ED Notes (Signed)
Pt. presents with right foot pain onset this afternoon after a chair accidentally fell on it.

## 2014-06-09 NOTE — ED Provider Notes (Signed)
CSN: [id number]     Arrival date & time 06/09/14  1953 History  This chart was scribed for non-physician practitioner, Domenic Moras, PA-C, working with Fredia Sorrow, MD, by Delphia Grates, ED Scribe. This patient was seen in room TR10C/TR10C and the patient's care was started at 8:55 PM.    Chief Complaint  Patient presents with  . Foot Pain      The history is provided by the patient. No language interpreter was used.    HPI Comments: Cynthia Martinez is a 53 y.o. female who presents to the Emergency Department complaining of right foot pain that began today. Patient states a metal chair tipped over and struck her on the top of her right foot. She describes the pain as sharp and throbbing around the site of impact. She denies history of DM. Denies any numbness.  Denies any pain with ankle or knee.  Able to ambulate afterward.  No other complaint   Past Medical History  Diagnosis Date  . Anemia   . DUB (dysfunctional uterine bleeding)   . Hypertension   . Hep C w/o coma, chronic    Past Surgical History  Procedure Laterality Date  . Polypectomy     Family History  Problem Relation Age of Onset  . Diabetes     History  Substance Use Topics  . Smoking status: Former Smoker -- 0.50 packs/day for 9 years    Types: Cigarettes    Quit date: 07/19/2009  . Smokeless tobacco: Not on file  . Alcohol Use: No     Comment: socially   OB History   Grav Para Term Preterm Abortions TAB SAB Ect Mult Living                 Review of Systems  Constitutional: Negative for fever.  Musculoskeletal: Positive for arthralgias.  Skin: Negative for rash and wound.  Neurological: Negative for numbness.      Allergies  Hydrochlorothiazide; Penicillins; and Sulfa antibiotics  Home Medications   Prior to Admission medications   Medication Sig Start Date End Date Taking? Authorizing Provider  albuterol (VENTOLIN HFA) 108 (90 BASE) MCG/ACT inhaler Inhale 2 puffs into the lungs every  6 (six) hours as needed for wheezing. 01/07/12 03/30/14  Madhav Devani V, MD  amLODipine (NORVASC) 5 MG tablet Take 1 tablet (5 mg total) by mouth daily. 12/03/13 12/03/14  Juluis Mire, MD  azithromycin (ZITHROMAX Z-PAK) 250 MG tablet Take 1 tablet (250 mg total) by mouth daily. 500mg  PO day 1, then 250mg  PO days 205 03/30/14   Kaitlyn Szekalski, PA-C  metoprolol tartrate (LOPRESSOR) 25 MG tablet Take 1 tablet (25 mg total) by mouth 2 (two) times daily. 12/03/13 12/03/14  Juluis Mire, MD   Triage Vitals: BP 132/78  Pulse 84  Temp(Src) 97.7 F (36.5 C)  Resp 18  SpO2 94%  Physical Exam  Nursing note and vitals reviewed. Constitutional: She is oriented to person, place, and time. She appears well-developed and well-nourished. No distress.  HENT:  Head: Normocephalic and atraumatic.  Eyes: Conjunctivae and EOM are normal.  Neck: Neck supple. No tracheal deviation present.  Cardiovascular: Normal rate.   Pulmonary/Chest: Effort normal. No respiratory distress.  Musculoskeletal: Normal range of motion.  right foot tenderness to dorsum of mid foot. No tenderness. No deformity. No redness. No swelling. No ecchymosis Right ankle with FROM.  Neurological: She is alert and oriented to person, place, and time.  Skin: Skin is warm and dry.  Psychiatric: She has  a normal mood and affect. Her behavior is normal.    ED Course  Procedures (including critical care time)  DIAGNOSTIC STUDIES: Oxygen Saturation is 94% on room air, adequate by my interpretation.    COORDINATION OF CARE: At 9:11 PM R foot injury, xray neg for acute fx/dislocation, suspect bone contusion. Discussed treatment plan with patient which includes NSAIDs, RICE. Patient agrees.   Labs Review Labs Reviewed - No data to display  Imaging Review Dg Foot Complete Right  06/09/2014   CLINICAL DATA:  Right foot pain.  EXAM: RIGHT FOOT COMPLETE - 3+ VIEW  COMPARISON:  None.  FINDINGS: Imaged bones, joints and soft tissues appear  normal.  IMPRESSION: Negative exam.   Electronically Signed   By: Inge Rise M.D.   On: 06/09/2014 20:46     EKG Interpretation None      MDM   Final diagnoses:  Foot contusion, right, initial encounter    BP 132/78  Pulse 84  Temp(Src) 97.7 F (36.5 C)  Resp 18  SpO2 94%  I have reviewed nursing notes and vital signs. I personally reviewed the imaging tests through PACS system  I reviewed available ER/hospitalization records thought the EMR   I personally performed the services described in this documentation, which was scribed in my presence. The recorded information has been reviewed and is accurate.     Domenic Moras, PA-C 06/09/14 2112

## 2014-06-10 NOTE — ED Provider Notes (Signed)
Medical screening examination/treatment/procedure(s) were performed by non-physician practitioner and as supervising physician I was immediately available for consultation/collaboration.   EKG Interpretation None        Fredia Sorrow, MD 06/10/14 [phone]

## 2014-08-19 ENCOUNTER — Ambulatory Visit: Payer: No Typology Code available for payment source

## 2014-08-19 ENCOUNTER — Encounter: Payer: Self-pay | Admitting: Internal Medicine

## 2014-08-19 ENCOUNTER — Ambulatory Visit (INDEPENDENT_AMBULATORY_CARE_PROVIDER_SITE_OTHER): Payer: No Typology Code available for payment source | Admitting: Internal Medicine

## 2014-08-19 VITALS — BP 162/98 | HR 81 | Temp 98.2°F | Wt 106.8 lb

## 2014-08-19 DIAGNOSIS — D509 Iron deficiency anemia, unspecified: Secondary | ICD-10-CM

## 2014-08-19 DIAGNOSIS — I10 Essential (primary) hypertension: Secondary | ICD-10-CM

## 2014-08-19 DIAGNOSIS — K088 Other specified disorders of teeth and supporting structures: Secondary | ICD-10-CM

## 2014-08-19 DIAGNOSIS — Z23 Encounter for immunization: Secondary | ICD-10-CM

## 2014-08-19 DIAGNOSIS — Z1231 Encounter for screening mammogram for malignant neoplasm of breast: Secondary | ICD-10-CM

## 2014-08-19 DIAGNOSIS — K089 Disorder of teeth and supporting structures, unspecified: Secondary | ICD-10-CM

## 2014-08-19 DIAGNOSIS — D510 Vitamin B12 deficiency anemia due to intrinsic factor deficiency: Secondary | ICD-10-CM

## 2014-08-19 DIAGNOSIS — J449 Chronic obstructive pulmonary disease, unspecified: Secondary | ICD-10-CM

## 2014-08-19 DIAGNOSIS — Z0000 Encounter for general adult medical examination without abnormal findings: Secondary | ICD-10-CM

## 2014-08-19 DIAGNOSIS — B182 Chronic viral hepatitis C: Secondary | ICD-10-CM

## 2014-08-19 LAB — CBC
HCT: 48.6 % — ABNORMAL HIGH (ref 36.0–46.0)
Hemoglobin: 16 g/dL — ABNORMAL HIGH (ref 12.0–15.0)
MCH: 28.7 pg (ref 26.0–34.0)
MCHC: 32.9 g/dL (ref 30.0–36.0)
MCV: 87.1 fL (ref 78.0–100.0)
PLATELETS: 292 10*3/uL (ref 150–400)
RBC: 5.58 MIL/uL — AB (ref 3.87–5.11)
RDW: 13.8 % (ref 11.5–15.5)
WBC: 4.2 10*3/uL (ref 4.0–10.5)

## 2014-08-19 LAB — COMPLETE METABOLIC PANEL WITH GFR
ALT: 31 U/L (ref 0–35)
AST: 35 U/L (ref 0–37)
Albumin: 4.3 g/dL (ref 3.5–5.2)
Alkaline Phosphatase: 68 U/L (ref 39–117)
BUN: 14 mg/dL (ref 6–23)
CO2: 28 mEq/L (ref 19–32)
CREATININE: 0.94 mg/dL (ref 0.50–1.10)
Calcium: 10 mg/dL (ref 8.4–10.5)
Chloride: 100 mEq/L (ref 96–112)
GFR, EST AFRICAN AMERICAN: 79 mL/min
GFR, Est Non African American: 69 mL/min
Glucose, Bld: 84 mg/dL (ref 70–99)
Potassium: 3.9 mEq/L (ref 3.5–5.3)
Sodium: 141 mEq/L (ref 135–145)
TOTAL PROTEIN: 8.4 g/dL — AB (ref 6.0–8.3)
Total Bilirubin: 0.5 mg/dL (ref 0.3–1.2)

## 2014-08-19 LAB — PROTIME-INR
INR: 0.94 (ref <1.50)
Prothrombin Time: 12.6 seconds (ref 11.6–15.2)

## 2014-08-19 LAB — ANEMIA PANEL
%SAT: 33 % (ref 20–55)
FERRITIN: 125 ng/mL (ref 10–291)
FOLATE: 12.7 ng/mL
Iron: 120 ug/dL (ref 42–145)
TIBC: 369 ug/dL (ref 250–470)
UIBC: 249 ug/dL (ref 125–400)
Vitamin B-12: 761 pg/mL (ref 211–911)

## 2014-08-19 MED ORDER — AMLODIPINE BESYLATE 10 MG PO TABS: 10.0000 mg | ORAL_TABLET | Freq: Every day | ORAL | Status: DC

## 2014-08-19 MED ORDER — METOPROLOL TARTRATE 25 MG PO TABS: 25.0000 mg | ORAL_TABLET | Freq: Two times a day (BID) | ORAL | Status: DC

## 2014-08-19 NOTE — Assessment & Plan Note (Addendum)
Assessment: Pt with history of pernicious anemia compliant with oral B12 replacement therapy with normal levels on 02/18/12 who presents with symptoms of occasional peripheral neuropathy.   Plan:  -Obtain B12 levels --> improved from 646 to 761 -Continue oral B12 replacement therapy (equally effective to IM route) due to improved levels indicating adequate GI absorption  -Obtain CBC ---> no macrocytic anemia

## 2014-08-19 NOTE — Assessment & Plan Note (Addendum)
Assessment: Pt with chronic hepatitis C infection genotype 1b since Jan 2013 with last VL 64K never on treatment who presents with no symptoms of stigmata of chronic liver disease.   Plan:  -[facility]er to [facility]  -Obtain CMP, INR, HCV RNA, HCV genotype, ANA, HIV Ab, and liver US with elastography  -Administer 2nd dose of vaccination for HAV and HBV, to return in 6 months for 3rd dose of HBV vaccination

## 2014-08-19 NOTE — Assessment & Plan Note (Addendum)
Assessment: Pt with moderately well-controlled hypertension compliant with two-class (CCB & BB) anti-hypertensive therapy who presents with blood pressure of 162/98.   Plan:  -BP 162/98 not at goal <140/90 -Increase amlodipine from 5 mg daily to 10 mg daily  -Continue metoprolol tartrate 25 mg BID -Obtain CMP ---> normal

## 2014-08-19 NOTE — Assessment & Plan Note (Addendum)
Assessment: Pt with COPD of unknown stage with last PFT and CXR in 2013 who presents with no recent exacerbation.   Plan:  -Continue albuterol inhaler 2 puffs Q 6 hr PRN acute bronchospasm -Repeat PFT at next visit to determine if needs to be on maintenance therapy

## 2014-08-19 NOTE — Assessment & Plan Note (Addendum)
Assessment: Pt with poor dentition and recent antibiotics for possible tooth infection who presents with resolved symptoms.    Plan:  -Refer to dentistry for further care and assessment for denture placement -No signs of infection to warrant antibiotics -Ibuprofen 400 mg Q 6 hr PRN pain

## 2014-08-19 NOTE — Progress Notes (Signed)
Patient ID: Cynthia Martinez, female   DOB: 01/15/1961, 53 y.o.   MRN: [id number]    Subjective:   Patient ID: Cynthia Martinez female   DOB: 23-May-1961 53 y.o.   MRN: [id number]  HPI: [name] is a 53 y.o. pleasant woman with past medical history of hypertension, pernicious anemia, untreated chronic hepatitis C infection, and COPD who presents for routine clinic visit.  She reports compliance with taking amlodipine and metoprolol tartrate for hypertension. She has occasional headaches but denies blurry vision, chest pain, LE edema, or lightheadedness.   She has history of pernicious anemia and was previously on B12 monthly injections but is now taking OTC oral B12 pills daily. She reports having paraesthesias in her legs sometimes but denies gait instability or mental status changes.  She has history of iron deficiency anemia due to menorrhagia from adnexal fibroid (?) which was removed. She is no longer taking oral iron pills. She is due to see gynecology for follow-up (last seen Sept 2014).    She has history of chronic hepatitis C infection and was seen by hepatology in the past at [facility] but has not followed up. She has genotype 1b. She has occasional RUQ pain and not had liver US or biopsy in the past. She denies symptoms of stigmata of chronic liver disease, rash, arthralgias, or bleeding. She received her 1st doses of vaccinations for hepatis A and B at last visit June of 2014.     She has history of poor dentition and has several extracted teeth and reports needing dentures. She has had recent left upper gum tenderness and swelling with facial tenderness. She was seen by urgent care and given clindamycin to take for 5 days for possible infection. She was also given pain medications. She reports the pain and swelling have improved with no purulent discharge or bleeding of her tooth.    She has history of COPD and reports no recent exacerbations. She uses albuterol inhaler about  weekly. She has chronic cough and is a former smoker. She declines flu shot vaccination today.        Past Medical History  Diagnosis Date  . Anemia   . DUB (dysfunctional uterine bleeding)   . Hypertension   . Hep C w/o coma, chronic    Current Outpatient Prescriptions  Medication Sig Dispense Refill  . albuterol (VENTOLIN HFA) 108 (90 BASE) MCG/ACT inhaler Inhale 2 puffs into the lungs every 6 (six) hours as needed for wheezing.  6.7 g  3  . amLODipine (NORVASC) 5 MG tablet Take 1 tablet (5 mg total) by mouth daily.  90 tablet  4  . azithromycin (ZITHROMAX Z-PAK) 250 MG tablet Take 1 tablet (250 mg total) by mouth daily. 500mg  PO day 1, then 250mg  PO days 205  6 tablet  0  . ibuprofen (ADVIL,MOTRIN) 400 MG tablet Take 1 tablet (400 mg total) by mouth every 6 (six) hours as needed.  30 tablet  0  . metoprolol tartrate (LOPRESSOR) 25 MG tablet Take 1 tablet (25 mg total) by mouth 2 (two) times daily.  60 tablet  11  . traMADol (ULTRAM) 50 MG tablet Take 1 tablet (50 mg total) by mouth every 6 (six) hours as needed.  15 tablet  0   No current facility-administered medications for this visit.   Family History  Problem Relation Age of Onset  . Diabetes     History   Social History  . Marital Status: Single  Spouse Name: N/A    Number of Children: N/A  . Years of Education: N/A   Social History Main Topics  . Smoking status: Former Smoker -- 0.50 packs/day for 9 years    Types: Cigarettes    Quit date: 07/19/2009  . Smokeless tobacco: Not on file  . Alcohol Use: No     Comment: socially  . Drug Use: No  . Sexual Activity: Yes    Partners: Male    Birth Control/ Protection: Condom   Other Topics Concern  . Not on file   Social History Narrative   Lives with fiancee in [place]   2 daughter in 23's and 8'3.   Worked as Secretary/administrator in past.            Review of Systems: Review of Systems  Constitutional: Negative for fever and chills.  HENT:       Poor dentition    Eyes: Negative for blurred vision.  Respiratory: Positive for cough (chronic). Negative for sputum production, shortness of breath and wheezing.   Cardiovascular: Negative for chest pain and leg swelling.  Gastrointestinal: Negative for nausea, vomiting and abdominal pain.  Genitourinary: Negative for dysuria, urgency and frequency.  Musculoskeletal: Negative for joint pain.  Skin: Negative for rash.  Neurological: Positive for sensory change (in b/l LE) and headaches. Negative for dizziness.     Objective:  Physical Exam: Filed Vitals:   08/19/14 1317  BP: 162/98  Pulse: 81  Temp: 98.2 F (36.8 C)  TempSrc: Oral  Weight: 106 lb 12.8 oz (48.444 kg)  SpO2: 98%    Physical Exam  Constitutional: She is oriented to person, place, and time. No distress.  Thin-appearing  HENT:  Head: Normocephalic and atraumatic.  Right Ear: External ear normal.  Left Ear: External ear normal.  Nose: Nose normal.  Mouth/Throat: Oropharynx is clear and moist. No oropharyngeal exudate.  Poor dentition with multiple extracted tooth with no signs of infection.   Eyes: Conjunctivae and EOM are normal. Pupils are equal, round, and reactive to light. Right eye exhibits no discharge. Left eye exhibits no discharge. No scleral icterus.  Neck: Normal range of motion. Neck supple.  Cardiovascular: Normal rate, regular rhythm and normal heart sounds.   No murmur heard. Pulmonary/Chest: Effort normal. No respiratory distress. She has no wheezes. She has no rales.  Decreased breath sounds  Abdominal: Soft. Bowel sounds are normal. She exhibits no distension. There is no tenderness. There is no rebound and no guarding.  Musculoskeletal: Normal range of motion. She exhibits no edema and no tenderness.  Neurological: She is alert and oriented to person, place, and time.  Normal sensation to light touch of extremities. Normal proprioception and gait.   Skin: Skin is warm and dry. No rash noted. She is not  diaphoretic. No erythema. No pallor.  Psychiatric: She has a normal mood and affect. Her behavior is normal. Judgment and thought content normal.    Assessment & Plan:   Please see problem list for problem-based assessment and plan

## 2014-08-19 NOTE — Assessment & Plan Note (Addendum)
Assessment: Pt with history of iron-deficiency anemia (ferritin level 3 in 2009) due to menorrhagia from adnexal fibroid no longer on oral iron therapy who presents with no active bleeding or hemodynamic instability.   Plan:  -Obtain CBC ---> no anemia, HCT elevated at 48.6% and Hg 16 -Obtain anemia panel ----> ferritin improved from 646 to 761, no need for iron replacement therapy -Pt declined screening colonoscopy  -Refer to gynecology for annual follow-up

## 2014-08-19 NOTE — Patient Instructions (Addendum)
-  Will check your labs today and call you with the results -Will refer you to dentistry and hepatitis clinic  -Start taking amlodipine 10 mg daily instead of 5 mg daily for high blood pressure -Will call you with appt date/time for your mammogram and abdominal ultrasound -Will give you 2nd hepatis B and A vaccine today, please come back in 6 months in April for your 3rd Hepatitis B shot -Will get your records from gynecology  -Pleasure meeting you, will see you back in April!    General Instructions:   Please bring your medicines with you each time you come to clinic.  Medicines may include prescription medications, over-the-counter medications, herbal remedies, eye drops, vitamins, or other pills.   Progress Toward Treatment Goals:  No flowsheet data found.  Self Care Goals & Plans:  No flowsheet data found.  No flowsheet data found.   Care Management & Community Referrals:  No flowsheet data found.

## 2014-08-19 NOTE — Assessment & Plan Note (Addendum)
-  Pt received 2nd dose of HAV and HBV and to have 3rd dose of HBV in 6 months (April 2016) -Pt declined annual influenza vaccination and screening colonoscopy today on 08/19/14

## 2014-08-20 LAB — HIV ANTIBODY (ROUTINE TESTING W REFLEX): HIV: NONREACTIVE

## 2014-08-22 LAB — ANA: Anti Nuclear Antibody(ANA): NEGATIVE

## 2014-08-22 NOTE — Progress Notes (Signed)
INTERNAL MEDICINE TEACHING ATTENDING ADDENDUM - Sadia Belfiore, MD: I reviewed and discussed at the time of visit with the resident Dr. Rabbani, the patient's medical history, physical examination, diagnosis and results of pertinent tests and treatment and I agree with the patient's care as documented.  

## 2014-08-23 LAB — HEPATITIS C RNA QUANTITATIVE
HCV QUANT: 565734 [IU]/mL — AB (ref <15)
HCV Quantitative Log: 5.75 {Log} — ABNORMAL HIGH (ref <1.18)

## 2014-08-24 ENCOUNTER — Encounter: Payer: Self-pay | Admitting: Family Medicine

## 2014-08-25 ENCOUNTER — Ambulatory Visit (HOSPITAL_COMMUNITY): Payer: No Typology Code available for payment source

## 2014-08-26 LAB — HEPATITIS C GENOTYPE

## 2014-08-29 ENCOUNTER — Telehealth: Payer: Self-pay | Admitting: *Deleted

## 2014-08-29 NOTE — Telephone Encounter (Signed)
The gynecology referral was actually for routine follow-up not for anemia. She was seen by Dr. Jodi Mourning September of 2014 and he suggested 1-year follow-up (has history of adnexal fibroid that was removed). Her anemia has resolved. Her B12 level has normalized and it seems that she has good GI absorption. I do not have a problem if she would like IM therapy instead of oral therapy due to side effect of constipation. Thanks!  Dr. Naaman Plummer

## 2014-08-29 NOTE — Telephone Encounter (Signed)
Call made to pt after seeing Gyn appt was canceled in [place].  Pt stated MD contacted her with lab results-anemia panel was normal, so when [facility] contacted her about a gyn appt for anemia, she informed them that she no longer needed the appt.  Pt was also informed that we offer pap smears here in the office and she wishes to obtain one in April 2016 when she comes back in to see pcp.  Pt is also requesting to come in once a month for b-12 injections, states the "pills" causes her to become very constipated.  Will forward info to pcp for review.Despina Hidden Cassady10/12/201510:36 AM

## 2014-08-30 ENCOUNTER — Ambulatory Visit (HOSPITAL_COMMUNITY)
Admission: RE | Admit: 2014-08-30 | Discharge: 2014-08-30 | Disposition: A | Discharge status: Home / Self Care | Payer: No Typology Code available for payment source | Source: Ambulatory Visit | Attending: Internal Medicine | Admitting: Internal Medicine

## 2014-08-30 DIAGNOSIS — Z1231 Encounter for screening mammogram for malignant neoplasm of breast: Secondary | ICD-10-CM

## 2014-09-01 ENCOUNTER — Ambulatory Visit (HOSPITAL_COMMUNITY): Payer: No Typology Code available for payment source

## 2014-09-07 ENCOUNTER — Ambulatory Visit (HOSPITAL_COMMUNITY)
Admission: RE | Admit: 2014-09-07 | Discharge: 2014-09-07 | Disposition: A | Discharge status: Home / Self Care | Payer: Self-pay | Source: Ambulatory Visit | Attending: Internal Medicine | Admitting: Internal Medicine

## 2014-09-07 DIAGNOSIS — K8020 Calculus of gallbladder without cholecystitis without obstruction: Secondary | ICD-10-CM | POA: Insufficient documentation

## 2014-09-07 DIAGNOSIS — B182 Chronic viral hepatitis C: Secondary | ICD-10-CM | POA: Insufficient documentation

## 2014-09-07 DIAGNOSIS — D1803 Hemangioma of intra-abdominal structures: Secondary | ICD-10-CM | POA: Insufficient documentation

## 2014-09-14 ENCOUNTER — Other Ambulatory Visit (INDEPENDENT_AMBULATORY_CARE_PROVIDER_SITE_OTHER): Payer: No Typology Code available for payment source

## 2014-09-14 DIAGNOSIS — B182 Chronic viral hepatitis C: Secondary | ICD-10-CM

## 2014-09-15 LAB — HEPATITIS B SURFACE ANTIGEN: HEP B S AG: NEGATIVE

## 2014-09-15 LAB — HEPATITIS A ANTIBODY, TOTAL: HEP A TOTAL AB: REACTIVE — AB

## 2014-09-15 LAB — HEPATITIS B CORE ANTIBODY, TOTAL: HEP B C TOTAL AB: NONREACTIVE

## 2014-09-15 LAB — HEPATITIS B SURFACE ANTIBODY,QUALITATIVE: Hep B S Ab: NEGATIVE

## 2014-09-16 ENCOUNTER — Ambulatory Visit (INDEPENDENT_AMBULATORY_CARE_PROVIDER_SITE_OTHER): Payer: Self-pay | Admitting: Internal Medicine

## 2014-09-16 VITALS — BP 140/97 | HR 79 | Temp 98.1°F | Wt 104.0 lb

## 2014-09-16 DIAGNOSIS — J309 Allergic rhinitis, unspecified: Secondary | ICD-10-CM

## 2014-09-16 DIAGNOSIS — Z0000 Encounter for general adult medical examination without abnormal findings: Secondary | ICD-10-CM

## 2014-09-16 DIAGNOSIS — D510 Vitamin B12 deficiency anemia due to intrinsic factor deficiency: Secondary | ICD-10-CM

## 2014-09-16 DIAGNOSIS — B182 Chronic viral hepatitis C: Secondary | ICD-10-CM

## 2014-09-16 DIAGNOSIS — I10 Essential (primary) hypertension: Secondary | ICD-10-CM

## 2014-09-16 MED ORDER — CETIRIZINE HCL 10 MG PO TABS: 10.0000 mg | ORAL_TABLET | Freq: Every day | ORAL | Status: DC

## 2014-09-16 MED ORDER — FLUTICASONE PROPIONATE 50 MCG/ACT NA SUSP: 2.0000 | Freq: Every day | NASAL | Status: DC

## 2014-09-16 MED ORDER — CYANOCOBALAMIN 1000 MCG/ML IJ SOLN: 1000.0000 ug | INTRAMUSCULAR | Status: DC

## 2014-09-16 NOTE — Progress Notes (Signed)
Patient ID: Cynthia Martinez, female   DOB: 17-May-1961, 53 y.o.   MRN: [id number]    Subjective:   Patient ID: Cynthia Martinez female   DOB: 06-14-1961 53 y.o.   MRN: [id number]  HPI: [name] is a 53 y.o. pleasant woman with past medical history of hypertension, pernicious anemia, untreated chronic hepatitis C infection, and COPD who presents for follow-up of imaging results.   She had a mammogram performed on 08/30/14 that was normal however revealed heterogeneously dense breast tissue (which may obscure small masses).   She has history of pernicious anemia and was previously on B12 monthly injections and now on OTC oral B12 pills daily which she reports constipation with. Her last anemia panel revealed resolved anemia and normal B12 and foalte levels. She would like to return to having B12 injections. She reports having paraesthesias in her legs sometimes but denies gait instability or mental status changes.   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 She is to see [facility] clinic soon and is due for her 3rd Hepatitis B shot in April 2016.    She reports having left ear "popping" that is relieved when she blows her nose that has been ongoing for the past 4-5 months. She was seen in the ED on 03/30/14 for left ear pain and productive cough thought to be due to COPD exacerbation and treated with azithromycin. She reports mild hearing loss but denies ear pain, fullness, vertigo, tinnitus, fever, or chills. She reports having possible seasonal allergies and is currently not on anti-histamine or nasal corticosteroid therapy. She has non-purulent rhinorrhea, nasal congestion, with no sneezing, PND, cough, sore throat, red/itchy/watery  eyes, or sinus tenderness. She reports having wax buildup in her left ear before and used sweet oil which helped.    She reports compliance with taking increased dosage of amlodipine as well as metoprolol tartrate for hypertension. She has occasional headaches but denies blurry vision, chest pain, LE edema, or lightheadedness.   She would like to postpone her 1-year follow-up with gynecology until a later date. She declines flu shot vaccination and screening colonoscopy today.      Past Medical History  Diagnosis Date  . Anemia   . DUB (dysfunctional uterine bleeding)   . Hypertension   . Hep C w/o coma, chronic    Current Outpatient Prescriptions  Medication Sig Dispense Refill  . albuterol (VENTOLIN HFA) 108 (90 BASE) MCG/ACT inhaler Inhale 2 puffs into the lungs every 6 (six) hours as needed for wheezing.  6.7 g  3  . amLODipine (NORVASC) 10 MG tablet Take 1 tablet (10 mg total) by mouth daily.  90 tablet  3  . ibuprofen (ADVIL,MOTRIN) 400 MG tablet Take 1 tablet (400 mg total) by mouth every 6 (six) hours as needed.  30 tablet  0  . metoprolol tartrate (LOPRESSOR) 25 MG tablet Take 1 tablet (25 mg total) by mouth 2 (two) times daily.  180 tablet  3   No current facility-administered medications for this visit.   Family History  Problem Relation Age of Onset  . Diabetes     History   Social History  . Marital Status: Single    Spouse Name: N/A    Number of Children: N/A  .  Years of Education: N/A   Social History Main Topics  . Smoking status: Former Smoker -- 0.50 packs/day for 9 years    Types: Cigarettes    Quit date: 07/19/2009  . Smokeless tobacco: Not on file  . Alcohol Use: No     Comment: socially  . Drug Use: No  . Sexual Activity: Yes    Partners: Male    Birth Control/ Protection: Condom   Other Topics Concern  . Not on file   Social History Narrative   Lives with fiancee in [place]   2 daughter in 46's and 82'3.   Worked as Secretary/administrator in past.             Review of Systems: Review of Systems  Constitutional: Negative for fever and chills.  HENT: Positive for congestion and hearing loss. Negative for ear discharge, ear pain, sore throat and tinnitus.        Rhinorrhea, poor dentition   Eyes: Positive for blurred vision.  Respiratory: Negative for cough, shortness of breath and wheezing.   Cardiovascular: Negative for chest pain, palpitations and leg swelling.  Gastrointestinal: Negative for nausea, vomiting, abdominal pain, diarrhea and constipation.  Genitourinary: Negative for dysuria, urgency and frequency.  Neurological: Positive for sensory change (occasionally in b/l LE ) and headaches (occassionally). Negative for dizziness.  Endo/Heme/Allergies: Positive for environmental allergies (possibly ).    Objective:  Physical Exam: Filed Vitals:   09/16/14 1531  BP: 140/97  Pulse: 79  Temp: 98.1 F (36.7 C)  TempSrc: Oral  Weight: 104 lb (47.174 kg)  SpO2: 98%    Physical Exam  Constitutional: She is oriented to person, place, and time. She appears well-developed and well-nourished. No distress.  HENT:  Head: Normocephalic and atraumatic.  Right Ear: Tympanic membrane, external ear and ear canal normal.  Left Ear: External ear and ear canal normal.  Nose: Nose normal.  Mouth/Throat: Oropharynx is clear and moist. No oropharyngeal exudate.  Left internal ear hard to visualize due to numerous hairs, appears to have wax buildup  Eyes: EOM are normal.  Neck: Normal range of motion. Neck supple.  Cardiovascular: Normal rate and regular rhythm.   Pulmonary/Chest: Effort normal and breath sounds normal. No respiratory distress. She has no wheezes. She has no rales.  Abdominal: Soft. Bowel sounds are normal. She exhibits no distension. There is no tenderness. There is no rebound and no guarding.  Musculoskeletal: Normal range of motion. She exhibits no edema and no tenderness.  Neurological: She is alert and oriented to  person, place, and time.  Skin: Skin is warm and dry. No rash noted. She is not diaphoretic. No erythema. No pallor.  Psychiatric: She has a normal mood and affect. Her behavior is normal. Judgment and thought content normal.    Assessment & Plan:   Please see problem list for problem-based assessment and plan

## 2014-09-16 NOTE — Patient Instructions (Addendum)
-  Your mammogram was normal  -Take zyrtec and flonase daily for allergies -Don't use q-tips and try vineagar/peroxide for ear wax removal -Will give you B12 injection today and continue monthly -Will call you with your dentistry appointment  -Nice seeing you today, will see you in April for your 3rd Hepatitis B shot  Allergic Rhinitis Allergic rhinitis is when the mucous membranes in the nose respond to allergens. Allergens are particles in the air that cause your body to have an allergic reaction. This causes you to release allergic antibodies. Through a chain of events, these eventually cause you to release histamine into the blood stream. Although meant to protect the body, it is this release of histamine that causes your discomfort, such as frequent sneezing, congestion, and an itchy, runny nose.  CAUSES  Seasonal allergic rhinitis (hay fever) is caused by pollen allergens that may come from grasses, trees, and weeds. Year-round allergic rhinitis (perennial allergic rhinitis) is caused by allergens such as house dust mites, pet dander, and mold spores.  SYMPTOMS   Nasal stuffiness (congestion).  Itchy, runny nose with sneezing and tearing of the eyes. DIAGNOSIS  Your health care provider can help you determine the allergen or allergens that trigger your symptoms. If you and your health care provider are unable to determine the allergen, skin or blood testing may be used. TREATMENT  Allergic rhinitis does not have a cure, but it can be controlled by:  Medicines and allergy shots (immunotherapy).  Avoiding the allergen. Hay fever may often be treated with antihistamines in pill or nasal spray forms. Antihistamines block the effects of histamine. There are over-the-counter medicines that may help with nasal congestion and swelling around the eyes. Check with your health care provider before taking or giving this medicine.  If avoiding the allergen or the medicine prescribed do not work,  there are many new medicines your health care provider can prescribe. Stronger medicine may be used if initial measures are ineffective. Desensitizing injections can be used if medicine and avoidance does not work. Desensitization is when a patient is given ongoing shots until the body becomes less sensitive to the allergen. Make sure you follow up with your health care provider if problems continue. HOME CARE INSTRUCTIONS It is not possible to completely avoid allergens, but you can reduce your symptoms by taking steps to limit your exposure to them. It helps to know exactly what you are allergic to so that you can avoid your specific triggers. SEEK MEDICAL CARE IF:   You have a fever.  You develop a cough that does not stop easily (persistent).  You have shortness of breath.  You start wheezing.  Symptoms interfere with normal daily activities. Document Released: 07/30/2001 Document Revised: 11/09/2013 Document Reviewed: 07/12/2013 [facility] Patient Information 2015 [place], [place]. This information is not intended to replace advice given to you by your health care provider. Make sure you discuss any questions you have with your health care provider.   General Instructions:   Please bring your medicines with you each time you come to clinic.  Medicines may include prescription medications, over-the-counter medications, herbal remedies, eye drops, vitamins, or other pills.   Progress Toward Treatment Goals:  No flowsheet data found.  Self Care Goals & Plans:  No flowsheet data found.  No flowsheet data found.   Care Management & Community Referrals:  No flowsheet data found.

## 2014-09-17 DIAGNOSIS — J309 Allergic rhinitis, unspecified: Secondary | ICD-10-CM | POA: Insufficient documentation

## 2014-09-17 NOTE — Assessment & Plan Note (Addendum)
Assessment: Pt with untreated chronic hepatitis C infection genotype 1b since Jan 2013 with last VL 565K, normal LFT's, and Metavir fibrosis score F0/F1 who presents with no symptoms of stigmata of chronic liver disease.   Plan:  -Pt to follow-up with [facility]  -Pt due for 3rd HBV vaccination in April 2016

## 2014-09-17 NOTE — Assessment & Plan Note (Signed)
Assessment: Pt with probable seasonal allergic rhinitis not currently on medical therapy who presents with symptoms of rhinitis and left ear popping.   Plan:  -Prescribe cetirizine 10 mg daily and fluticasone 2 sprays in each nostril daily -Pt instructed to use peroxide/vineager for left ear wax buildup

## 2014-09-17 NOTE — Assessment & Plan Note (Signed)
Assessment: Pt with moderately well-controlled hypertension compliant with two-class (CCB & BB) anti-hypertensive therapy with recent increased dosage of CCB therapy who presents with blood pressure of 140/97.  Plan:  -BP 140/97 at goal <140/90  -Continue amlodipine 10 mg daily and metoprolol tartrate 25 mg BID  -Last CMP on 08/19/14 was normal

## 2014-09-17 NOTE — Assessment & Plan Note (Addendum)
-  Pt declined referral for gynecology follow-up at this time.  -Pt declined referral for screening colonoscopy at this time. -Pt declined annual influenza vaccination today on 09/17/14.

## 2014-09-17 NOTE — Assessment & Plan Note (Addendum)
Assessment: Pt with history of pernicious anemia compliant with oral B12 replacement therapy with normal levels on 08/19/14 who presents with symptoms of occasional peripheral neuropathy.   Plan:  -Due to constipation and pt preference will restart monthly IM B12 replacement therapy (given today on 09/16/14) -Last CBC with no macrocytic anemia

## 2014-09-21 NOTE — Progress Notes (Signed)
Case discussed with Dr. Rabbani soon after the resident saw the patient.  We reviewed the resident's history and exam and pertinent patient test results.  I agree with the assessment, diagnosis, and plan of care documented in the resident's note. 

## 2014-09-27 NOTE — Addendum Note (Signed)
Addended by: Marcelino Duster on: 09/27/2014 09:12 AM   Modules accepted: Orders

## 2014-10-06 ENCOUNTER — Encounter: Payer: No Typology Code available for payment source | Admitting: Family Medicine

## 2014-10-18 ENCOUNTER — Encounter: Payer: Self-pay | Admitting: Internal Medicine

## 2014-10-18 ENCOUNTER — Ambulatory Visit (INDEPENDENT_AMBULATORY_CARE_PROVIDER_SITE_OTHER): Payer: No Typology Code available for payment source | Admitting: Internal Medicine

## 2014-10-18 VITALS — BP 148/92 | HR 93 | Temp 97.9°F | Ht 63.0 in | Wt 106.0 lb

## 2014-10-18 DIAGNOSIS — B182 Chronic viral hepatitis C: Secondary | ICD-10-CM

## 2014-10-18 MED ORDER — LEDIPASVIR-SOFOSBUVIR 90-400 MG PO TABS: 1.0000 | ORAL_TABLET | Freq: Every day | ORAL | Status: DC

## 2014-10-18 NOTE — Patient Instructions (Signed)
Date 10/18/14  Dear Ms Mare Ferrari, As discussed in the [facility], your hepatitis C therapy will include the following medications:          Harvoni 90mg /400mg  tablet:           Take 1 tablet by mouth once daily   Please note that ALL MEDICATIONS WILL START ON THE SAME DATE for a total of 12 weeks. ---------------------------------------------------------------- Your HCV Treatment Start Date: TBA   Your HCV genotype:  1b    Liver Fibrosis: TBD    ---------------------------------------------------------------- YOUR PHARMACY CONTACT:   [facility] Phone: [phone] Hours: Monday to Friday 7:30 am to 6:00 pm   Please always contact your pharmacy at least 3-4 business days before you run out of medications to ensure your next month's medication is ready or 1 week prior to running out if you receive it by mail.  Remember, each prescription is for 28 days. ---------------------------------------------------------------- GENERAL NOTES REGARDING YOUR HEPATITIS C MEDICATION:  SOFOSBUVIR/LEDIPASVIR (HARVONI): - Harvoni tablet is taken daily with OR without food. - The tablets are orange. - The tablets should be stored at room temperature.  - Acid reducing agents such as H2 blockers (ie. Pepcid (famotidine), Zantac (ranitidine), Tagamet (cimetidine), Axid (nizatidine) and proton pump inhibitors (ie. Prilosec (omeprazole), Protonix (pantoprazole), Nexium (esomeprazole), or Aciphex (rabeprazole)) can decrease effectiveness of Harvoni. Do not take until you have discussed with a health care provider.    -Antacids that contain magnesium and/or aluminum hydroxide (ie. Milk of Magensia, Rolaids, Gaviscon, Maalox, Mylanta, an dArthritis Pain Formula)can reduce absorption of Harvoni, so take them at least 4 hours before or after Harvoni.  -Calcium carbonate (calcium supplements or antacids such as Tums, Caltrate,  Os-Cal)needs to be taken at least 4 hours hours before or after Harvoni.  -St. John's wort or any products that contain St. John's wort like some herbal supplements  Please inform the office prior to starting any of these medications.  - The common side effects with Harvoni:      1. Fatigue      2. Headache      3. Nausea      4. Diarrhea      5. Insomnia   Support Path is a suite of resources designed to help patients start with HARVONI and move toward treatment completion [place] helps patients access therapy and get off to an efficient start  Benefits investigation and prior authorization support Co-pay and other financial assistance A specialty pharmacy finder CO-PAY COUPON The [place] co-pay coupon may help eligible patients lower their out-of-pocket costs. With a co-pay coupon, most eligible patients may pay no more than $5 per co-pay (restrictions apply) www.harvoni.com call 3515119294 Not valid for patients enrolled in government healthcare prescription drug programs, such as Medicare Part D and Medicaid. Patients in the coverage gap known as the "donut hole" also are not eligible The HARVONI co-pay coupon program will cover the out-of-pocket costs for HARVONI prescriptions up to a maximum of 25% of the catalog price of a 12-week regimen of HARVONI  Please note that this only lists the most common side effects and is NOT a comprehensive list of the potential side effects of these medications. For more information, please review the drug information sheets that come with your medication package from the pharmacy.  ---------------------------------------------------------------- GENERAL HELPFUL HINTS ON HCV THERAPY: 1. No alcohol. 2. Protect against sun-sensitivity/sunburns (wear sunglasses, hat, long sleeves, pants  and sunscreen). 3. Stay well-hydrated/well-moisturized. 4. Notify the [facility] of any changes in your other over-the-counter/herbal or  prescription medications. 5. If you miss a dose of your medication, take the missed dose as soon as you remember. Return to your regular time/dose schedule the next day.  6.  Do not stop taking your medications without first talking with your healthcare provider. 7.  You may take Tylenol (acetaminophen), as long as the dose is less than 2000 mg (OR no more than 4 tablets of the Tylenol Extra Strengths 500mg  tablet) in 24 hours. 8.  You will need to obtain routine labs and/or office visits at [facility] at weeks 2, 4, 8,  and 12 as well as 12 and 24 weeks after completion of treatment.   Scharlene Gloss, [place] for [address] [place], [place]  [phone]

## 2014-10-18 NOTE — Progress Notes (Signed)
+Cynthia Martinez is a 53 y.o. female who presents for initial evaluation and management of a positive Hepatitis C antibody test.  Patient tested positive one year ago as a screening test. Hepatitis C risk factors present are: none. Patient denies IV drug abuse, multiple sexual partners, renal dialysis, sexual contact with person with liver disease, tattoos. Patient has had other studies performed. Results: ultrasound of abdomen, result: F0/F1, hepatitis C RNA by PCR, result: positive. Patient has not had prior treatment for Hepatitis C. Patient does not have a past history of liver disease. Patient does not have a family history of liver disease.   HPI: She has trouble swallowing pills and is concerned with the size of Harvoni.    Patient does have documented immunity to Hepatitis A. Patient does have documented immunity to Hepatitis B.     Review of Systems A comprehensive review of systems was negative.   Past Medical History  Diagnosis Date  . Anemia   . DUB (dysfunctional uterine bleeding)   . Hypertension   . Hep C w/o coma, chronic     Prior to Admission medications   Medication Sig Start Date End Date Taking? Authorizing Provider  albuterol (VENTOLIN HFA) 108 (90 BASE) MCG/ACT inhaler Inhale 2 puffs into the lungs every 6 (six) hours as needed for wheezing. 01/07/12 03/30/14  Madhav Devani V, MD  amLODipine (NORVASC) 10 MG tablet Take 1 tablet (10 mg total) by mouth daily. 08/19/14 08/19/15  Juluis Mire, MD  cetirizine (ZYRTEC) 10 MG tablet Take 1 tablet (10 mg total) by mouth daily. 09/16/14   Juluis Mire, MD  cyanocobalamin (,VITAMIN B-12,) 1000 MCG/ML injection Inject 1 mL (1,000 mcg total) into the muscle every 30 (thirty) days. 09/16/14   Juluis Mire, MD  fluticasone (FLONASE) 50 MCG/ACT nasal spray Place 2 sprays into both nostrils daily. 09/16/14   Juluis Mire, MD  ibuprofen (ADVIL,MOTRIN) 400 MG tablet Take 1 tablet (400 mg total) by mouth every 6 (six) hours as  needed. 06/09/14   Domenic Moras, PA-C  metoprolol tartrate (LOPRESSOR) 25 MG tablet Take 1 tablet (25 mg total) by mouth 2 (two) times daily. 08/19/14 08/19/15  Juluis Mire, MD    Allergies  Allergen Reactions  . Hydrochlorothiazide Hives  . Penicillins Hives  . Sulfa Antibiotics Hives    History  Substance Use Topics  . Smoking status: Former Smoker -- 0.50 packs/day for 9 years    Types: Cigarettes    Quit date: 07/19/2009  . Smokeless tobacco: Not on file  . Alcohol Use: No     Comment: socially    Family History  Problem Relation Age of Onset  . Diabetes        Objective:  There were no vitals filed for this visit. in no apparent distress and alert HEENT: anicteric Cor RRR and No murmurs clear Bowel sounds are normal, liver is not enlarged, spleen is not enlarged peripheral pulses normal, no pedal edema, no clubbing or cyanosis negative for - jaundice, spider hemangioma, telangiectasia, palmar erythema, ecchymosis and atrophy  Laboratory Genotype:  Lab Results  Component Value Date   HCVGENOTYPE 1b 08/19/2014   HCV viral load:  Lab Results  Component Value Date   HCVQUANT 433295* 08/19/2014   Lab Results  Component Value Date   WBC 4.2 08/19/2014   HGB 16.0* 08/19/2014   HCT 48.6* 08/19/2014   MCV 87.1 08/19/2014   PLT 292 08/19/2014    Lab Results  Component Value Date   CREATININE 0.94  08/19/2014   BUN 14 08/19/2014   NA 141 08/19/2014   K 3.9 08/19/2014   CL 100 08/19/2014   CO2 28 08/19/2014    Lab Results  Component Value Date   ALT 31 08/19/2014   AST 35 08/19/2014   ALKPHOS 68 08/19/2014   BILITOT 0.5 08/19/2014   INR 0.94 08/19/2014      Assessment: Hepatitis C genotype 1b  Plan: 1) Patient counseled extensively on limiting acetaminophen to no more than 2 grams daily, avoidance of alcohol. 2) Transmission discussed with patient including sexual transmission, sharing razors and toothbrush.   3) Will need referral to  gastroenterology if concern for cirrhosis 4) Will need referral for substance abuse counseling: No. 5) Will prescribe Harvoni for 12 weeks to the [facility] outpatient pharmacy to try for patient assistance.  Patient aware they may need financial data or other information and may contact her.  6) Hepatitis A vaccine No. 7) Hepatitis B vaccine No. 8) Pneumovax vaccine if concern for cirrhosis 9) will follow up after starting medication or in 1 year if denied

## 2014-11-02 ENCOUNTER — Ambulatory Visit (INDEPENDENT_AMBULATORY_CARE_PROVIDER_SITE_OTHER): Payer: No Typology Code available for payment source | Admitting: *Deleted

## 2014-11-02 DIAGNOSIS — D510 Vitamin B12 deficiency anemia due to intrinsic factor deficiency: Secondary | ICD-10-CM

## 2014-11-02 MED ORDER — CYANOCOBALAMIN 1000 MCG/ML IJ SOLN
1000.0000 ug | Freq: Once | INTRAMUSCULAR | Status: AC
  Administered 2014-11-02: 1000 ug via INTRAMUSCULAR

## 2014-11-29 ENCOUNTER — Telehealth: Payer: Self-pay | Admitting: *Deleted

## 2014-11-29 NOTE — Telephone Encounter (Signed)
Patient called asking if Dr. Linus Salmons would sign the patient assistance application for Harvoni. Pt was advised to bring it anytime. Landis Gandy, RN

## 2014-12-29 ENCOUNTER — Encounter: Payer: Self-pay | Admitting: *Deleted

## 2015-01-16 ENCOUNTER — Telehealth: Payer: Self-pay | Admitting: Pharmacist Clinician (PhC)/ Clinical Pharmacy Specialist

## 2015-01-16 ENCOUNTER — Telehealth: Payer: Self-pay | Admitting: *Deleted

## 2015-01-16 NOTE — Telephone Encounter (Signed)
Patient is unable to swallow the Harvoni pills.  Pt reports she tried 1 pill, but it "got stuck" and her brother had to do the heimlich to get it out.  She was able to get the pill back up, and threw it away. Patient has to crush all pills to take them, states "even a birth control pill is too large."  She has been told that she cannot crush/break the Harvoni. She reports that she feels like her esophagus is too small.   RN advised patient to contact her PCP to ask for advice/appointment/perhaps referral for her swallowing issues, patient agreed. Will forward to Onnie Boer, PharmD for advice on Harvoni. Landis Gandy, RN

## 2015-01-16 NOTE — Telephone Encounter (Signed)
Pt has questions for the pharmacist.

## 2015-01-16 NOTE — Telephone Encounter (Signed)
Called her back to answer her question about abx for dental procedure.

## 2015-01-16 NOTE — Telephone Encounter (Signed)
Pt has some serious issue with swallowing meds. She tried with the first dose of Harvoni and could not swallowed it. Called her back to tell her not to crush it but dissolve the tablets in a glass of juice and take it that way. She agreed with the plan.

## 2015-02-03 ENCOUNTER — Telehealth: Payer: Self-pay | Admitting: Licensed Clinical Social Worker

## 2015-02-03 NOTE — Telephone Encounter (Signed)
Patient has lab appointment on 3/23 but she just started taking Harvoni on 3/2, does she need to keep that appointment or reschedule.

## 2015-02-06 NOTE — Telephone Encounter (Signed)
Spoke with patient and lab rescheduled for 02/15/15 and MD appt changed to 02/23/15. Cynthia Martinez

## 2015-02-08 ENCOUNTER — Other Ambulatory Visit: Payer: No Typology Code available for payment source

## 2015-02-13 ENCOUNTER — Ambulatory Visit: Payer: No Typology Code available for payment source

## 2015-02-15 ENCOUNTER — Ambulatory Visit: Payer: No Typology Code available for payment source

## 2015-02-15 ENCOUNTER — Other Ambulatory Visit: Payer: No Typology Code available for payment source

## 2015-02-15 DIAGNOSIS — B182 Chronic viral hepatitis C: Secondary | ICD-10-CM

## 2015-02-16 LAB — HEPATITIS C RNA QUANTITATIVE: HCV Quantitative Log: <1.18 {Log} (ref <1.18)

## 2015-02-17 ENCOUNTER — Encounter: Payer: No Typology Code available for payment source | Admitting: Internal Medicine

## 2015-02-21 ENCOUNTER — Ambulatory Visit: Payer: No Typology Code available for payment source | Admitting: Internal Medicine

## 2015-02-23 ENCOUNTER — Encounter: Payer: Self-pay | Admitting: Internal Medicine

## 2015-02-23 ENCOUNTER — Ambulatory Visit (INDEPENDENT_AMBULATORY_CARE_PROVIDER_SITE_OTHER): Payer: No Typology Code available for payment source | Admitting: Internal Medicine

## 2015-02-23 VITALS — BP 144/100 | HR 96 | Temp 98.4°F | Wt 102.0 lb

## 2015-02-23 DIAGNOSIS — B182 Chronic viral hepatitis C: Secondary | ICD-10-CM

## 2015-02-23 NOTE — Assessment & Plan Note (Signed)
She is doing well on Harvoni and will continue to take it for the 12 weeks. I will check her viral load after she finishes and see her 1 week after that and repeat it at 3 months. Her elastography is F0 to 1 so will not need further follow-up of her liver next year.

## 2015-02-23 NOTE — Progress Notes (Signed)
   Subjective:    Patient ID: Cynthia Martinez, female    DOB: [birth year], 54 y.o.   MRN: [id number]  HPI She comes in for follow-up of her hepatitis C.  She has genotype 1B with an initial viral load of 565,000. Her fibrosis score is F0 to F1. Since starting her medication 6 weeks ago she has noticed a significant improvement in her energy level. Hair and nails seem to be growing better. No problems taking the medicine daily. Her lab one week ago shows an undetectable viral load now. She is pleased with this result. She continues to have trouble gaining weight though precedes start of Kerr.   Review of Systems  Constitutional: Negative for fatigue.  Gastrointestinal: Negative for nausea and diarrhea.  Skin: Negative for rash.  Neurological: Negative for dizziness and light-headedness.       Objective:   Physical Exam  Constitutional: She appears well-developed and well-nourished. No distress.  HENT:  Mouth/Throat: No oropharyngeal exudate.  Eyes: No scleral icterus.  Cardiovascular: Normal rate, regular rhythm and normal heart sounds.   No murmur heard. Pulmonary/Chest: Effort normal and breath sounds normal. No respiratory distress.  Lymphadenopathy:    She has no cervical adenopathy.  Skin: No rash noted.          Assessment & Plan:

## 2015-02-24 ENCOUNTER — Encounter: Payer: Self-pay | Admitting: Internal Medicine

## 2015-02-24 ENCOUNTER — Ambulatory Visit (INDEPENDENT_AMBULATORY_CARE_PROVIDER_SITE_OTHER): Payer: Self-pay | Admitting: Internal Medicine

## 2015-02-24 VITALS — BP 149/86 | HR 78 | Temp 98.4°F | Wt 101.0 lb

## 2015-02-24 DIAGNOSIS — I10 Essential (primary) hypertension: Secondary | ICD-10-CM

## 2015-02-24 DIAGNOSIS — B182 Chronic viral hepatitis C: Secondary | ICD-10-CM

## 2015-02-24 DIAGNOSIS — Z681 Body mass index (BMI) 19 or less, adult: Secondary | ICD-10-CM

## 2015-02-24 DIAGNOSIS — R636 Underweight: Secondary | ICD-10-CM

## 2015-02-24 DIAGNOSIS — Z0000 Encounter for general adult medical examination without abnormal findings: Secondary | ICD-10-CM

## 2015-02-24 DIAGNOSIS — H6123 Impacted cerumen, bilateral: Secondary | ICD-10-CM

## 2015-02-24 DIAGNOSIS — D510 Vitamin B12 deficiency anemia due to intrinsic factor deficiency: Secondary | ICD-10-CM

## 2015-02-24 DIAGNOSIS — J449 Chronic obstructive pulmonary disease, unspecified: Secondary | ICD-10-CM

## 2015-02-24 MED ORDER — ALBUTEROL SULFATE HFA 108 (90 BASE) MCG/ACT IN AERS: 2.0000 | INHALATION_SPRAY | Freq: Four times a day (QID) | RESPIRATORY_TRACT | Status: DC | PRN

## 2015-02-24 NOTE — Progress Notes (Signed)
Internal Medicine Clinic Attending  Case discussed with Dr. Rabbani soon after the resident saw the patient.  We reviewed the resident's history and exam and pertinent patient test results.  I agree with the assessment, diagnosis, and plan of care documented in the resident's note.  

## 2015-02-24 NOTE — Progress Notes (Signed)
Patient ID: Cynthia Martinez, female   DOB: 06-Apr-1961, 54 y.o.   MRN: [id number]     Subjective:   Patient ID: Cynthia Martinez female   DOB: 1961/06/03 54 y.o.   MRN: [id number]  HPI: [name] is a 54 y.o. pleasant woman with past medical history of hypertension, pernicious anemia, chronic hepatitis C infection, and COPD who presents for routine visit.    She has history of chronic hepatitis C 1b infection and had US abdomen with elastography on 09/07/14 that revealed Metavir fibrosis score of F0/F1 with minimal risk of fibrosis. She was started on Harvoni treatment in March 2016 and is to complete treatment June 2016. On 3/30 her levels were <15. She reports her energy level and hair/nail growth have improved since starting treatment and denies any side effects. She follows with Dr. Linus Salmons. She due for her 3rd Hepatitis B shot.   She has history of pernicious anemia and was previously on B12 monthly injections and now on OTC oral B12 pills daily with normal levels and no anemia on 08/19/14. She denies paraesthesias, gait instability, or mental status changes.   She has been underweight for quite some time (at least since 2013 in chart review) and reports she is trying to gain weight by eating more and drinking nutritional supplements (has tried ensure).   She reports having a "stuffed up left ear" with popping that is relieved when she blows her nose that has been ongoing for quite some time (posibbly 1 year). She was seen in the ED close to 1 year ago (03/30/14) for left ear pain and productive cough thought to be due to COPD exacerbation and treated with azithromycin. She reports mild hearing loss but denies ear pain, fullness, vertigo, or tinnitus. She reports having wax buildup in both of her ears worse in her left ear. She has never seen ENT.    She reports compliance with taking amlodipine and metoprolol tartrate for hypertension. She denies headache, blurry vision, chest pain,  LE edema, or lightheadedness.   She declines screening colonoscopy today.     Past Medical History  Diagnosis Date  . Anemia   . DUB (dysfunctional uterine bleeding)   . Hypertension   . Hep C w/o coma, chronic    Current Outpatient Prescriptions  Medication Sig Dispense Refill  . albuterol (VENTOLIN HFA) 108 (90 BASE) MCG/ACT inhaler Inhale 2 puffs into the lungs every 6 (six) hours as needed for wheezing. 6.7 g 3  . amLODipine (NORVASC) 10 MG tablet Take 1 tablet (10 mg total) by mouth daily. 90 tablet 3  . cetirizine (ZYRTEC) 10 MG tablet Take 1 tablet (10 mg total) by mouth daily. 30 tablet 2  . fluticasone (FLONASE) 50 MCG/ACT nasal spray Place 2 sprays into both nostrils daily. 16 g 2  . ibuprofen (ADVIL,MOTRIN) 400 MG tablet Take 1 tablet (400 mg total) by mouth every 6 (six) hours as needed. 30 tablet 0  . Ledipasvir-Sofosbuvir (HARVONI) 90-400 MG TABS Take 1 tablet by mouth daily. 28 tablet 2  . metoprolol tartrate (LOPRESSOR) 25 MG tablet Take 1 tablet (25 mg total) by mouth 2 (two) times daily. 180 tablet 3   No current facility-administered medications for this visit.   Family History  Problem Relation Age of Onset  . Diabetes     History   Social History  . Marital Status: Single    Spouse Name: N/A  . Number of Children: N/A  . Years of Education: N/A  Social History Main Topics  . Smoking status: Former Smoker -- 0.50 packs/day for 9 years    Types: Cigarettes    Quit date: 07/19/2009  . Smokeless tobacco: Never Used  . Alcohol Use: No     Comment: socially  . Drug Use: No  . Sexual Activity:    Partners: Male    Birth Control/ Protection: Condom   Other Topics Concern  . None   Social History Narrative   Lives with fiancee in [place]   2 daughter in 20's and 30'3.   Worked as Secretary/administrator in past.            Review of Systems: Review of Systems  Constitutional: Positive for weight loss (chronically low BMI). Negative for malaise/fatigue.    HENT: Positive for hearing loss. Negative for ear discharge, ear pain and tinnitus.   Respiratory: Positive for cough (occasionally ), shortness of breath (with activity) and wheezing (occasionally ).   Cardiovascular: Negative for chest pain and leg swelling.  Gastrointestinal: Negative for nausea, vomiting, abdominal pain, diarrhea, constipation and blood in stool.  Genitourinary: Negative for dysuria, urgency and frequency.  Musculoskeletal: Negative for joint pain.  Skin: Negative for rash.  Neurological: Negative for dizziness, sensory change and headaches.    Objective:  Physical Exam: Filed Vitals:   02/24/15 1321  BP: 149/86  Pulse: 78  Temp: 98.4 F (36.9 C)  TempSrc: Oral  Weight: 101 lb (45.813 kg)  SpO2: 96%     Physical Exam  Constitutional: She is oriented to person, place, and time. No distress.  Thin appearing  HENT:  Head: Normocephalic and atraumatic.  Nose: Nose normal.  Mouth/Throat: Oropharynx is clear and moist. No oropharyngeal exudate.  Significant cerumen buildup in both ears. Left ear canal narrow   Eyes: Conjunctivae and EOM are normal. Pupils are equal, round, and reactive to light. Right eye exhibits no discharge. Left eye exhibits no discharge. No scleral icterus.  Neck: Normal range of motion. Neck supple.  Cardiovascular: Normal rate, regular rhythm and normal heart sounds.   Pulmonary/Chest: Effort normal and breath sounds normal. No respiratory distress. She has no wheezes. She has no rales.  Abdominal: Soft. Bowel sounds are normal. She exhibits no distension. There is no tenderness. There is no rebound and no guarding.  Neurological: She is alert and oriented to person, place, and time.  Skin: Skin is warm and dry. No rash noted. She is not diaphoretic. No erythema. No pallor.  Psychiatric: She has a normal mood and affect. Her behavior is normal. Judgment and thought content normal.    Assessment & Plan:   Please see problem list for  problem-based assessment and plan

## 2015-02-24 NOTE — Patient Instructions (Addendum)
-  Keep taking harvoni -Keep taking B12 supplements -I refilled your albuterol inhaler  -Start taking resource breeze or ensure supplements three times daily to help increase your weight -Will refer you to ENT for your ear problems  -Please come back in 6 months, glad you are doing well!     General Instructions:   Please bring your medicines with you each time you come to clinic.  Medicines may include prescription medications, over-the-counter medications, herbal remedies, eye drops, vitamins, or other pills.   Progress Toward Treatment Goals:  No flowsheet data found.  Self Care Goals & Plans:  No flowsheet data found.  No flowsheet data found.   Care Management & Community Referrals:  No flowsheet data found.

## 2015-02-26 DIAGNOSIS — H6123 Impacted cerumen, bilateral: Secondary | ICD-10-CM | POA: Insufficient documentation

## 2015-02-26 MED ORDER — CYANOCOBALAMIN 500 MCG PO TABS: 1000.0000 ug | ORAL_TABLET | Freq: Every day | ORAL | Status: DC

## 2015-02-26 NOTE — Assessment & Plan Note (Signed)
Pt declines screening colonoscopy and stool card testing.

## 2015-02-26 NOTE — Assessment & Plan Note (Addendum)
Assessment: Pt with moderately well-controlled hypertension compliant with two-class (CCB & BB) anti-hypertensive therapy who presents with blood pressure of 149/86.  Plan:  -BP 149/86 not at goal <140/90  -Continue amlodipine 10 mg daily and metoprolol tartrate 25 mg BID  -Last CMP on 08/19/14 was normal, declined testing today, repeat at next visit

## 2015-02-26 NOTE — Assessment & Plan Note (Addendum)
Assessment: Pt chronically underweight in setting of chronic Hepatitis C infection who presents with BMI of 17.9  Plan:  -BMI 17.9 not at goal of >20 -Pt encouraged to drink Resource Breeze TID between meals -Continue Harvoni for treatment of chronic hepatitis C -Will not prescribe megace (increased risk of VTE) -Consider nutrition consult if does not improve

## 2015-02-26 NOTE — Assessment & Plan Note (Addendum)
Assessment: Pt with history of pernicious anemia compliant with oral B12 replacement therapy with normal levels and no anemia on 08/19/14 who presents with no symptoms of B12 deficiency.   Plan:  -Pt declines B12 level and CBC testing, will obtain at next visit -Continue PO B12 1000 mcg daily

## 2015-02-26 NOTE — Assessment & Plan Note (Signed)
Assessment: Pt with chronic hepatitis C infection genotype 1b on 12 weeks treatment with Harvoni with last VL <15 on 02/15/15 and Metavir fibrosis score F0/F1 who presents with no symptoms of stigmata of chronic liver disease.   Plan:  -Continue harvoni 90-400 mg daily for 12 week course (to end June 2016)  -Recent VL <15 on 02/15/15 -Pt follows with Dr. [facility]  -Pt due for 3rd HBV vaccination after completion of Harvoni treatment

## 2015-02-26 NOTE — Assessment & Plan Note (Signed)
Assessment: Pt with close to 1-year duration of bilateral ear blockage and hearing loss in setting of excessive cerumen.   Plan:  -Refer to ENT for cerumen removal and further ear examination

## 2015-04-04 ENCOUNTER — Telehealth: Payer: Self-pay | Admitting: *Deleted

## 2015-04-06 ENCOUNTER — Telehealth: Payer: Self-pay | Admitting: *Deleted

## 2015-04-06 NOTE — Telephone Encounter (Signed)
Pt called and would like to talk with you about starting Apetamin, to increase her appetite.    Pt # [id number]

## 2015-04-07 NOTE — Telephone Encounter (Signed)
Called pt and advised her to increase her caloric intake and try nutrition/protein supplements instead as I have not heard about apetamin before and unsure of its effects. Pt verbalized understanding and is planning on increasing her calorie intake and will buy protein shakes to try.    Dr. Naaman Plummer

## 2015-04-13 ENCOUNTER — Other Ambulatory Visit: Payer: No Typology Code available for payment source

## 2015-04-13 DIAGNOSIS — B182 Chronic viral hepatitis C: Secondary | ICD-10-CM

## 2015-04-13 LAB — COMPLETE METABOLIC PANEL WITH GFR
ALBUMIN: 4.6 g/dL (ref 3.5–5.2)
ALK PHOS: 55 U/L (ref 39–117)
ALT: 10 U/L (ref 0–35)
AST: 22 U/L (ref 0–37)
BUN: 13 mg/dL (ref 6–23)
CHLORIDE: 102 meq/L (ref 96–112)
CO2: 25 mEq/L (ref 19–32)
Calcium: 9.9 mg/dL (ref 8.4–10.5)
Creat: 0.75 mg/dL (ref 0.50–1.10)
GFR, Est Non African American: >89 mL/min
GLUCOSE: 93 mg/dL (ref 70–99)
POTASSIUM: 4.2 meq/L (ref 3.5–5.3)
SODIUM: 140 meq/L (ref 135–145)
Total Bilirubin: 0.8 mg/dL (ref 0.2–1.2)
Total Protein: 7.9 g/dL (ref 6.0–8.3)

## 2015-04-16 LAB — HEPATITIS C RNA QUANTITATIVE: HCV Quantitative: NOT DETECTED IU/mL (ref <15)

## 2015-04-20 ENCOUNTER — Ambulatory Visit: Payer: No Typology Code available for payment source | Admitting: Internal Medicine

## 2015-04-20 ENCOUNTER — Telehealth: Payer: Self-pay | Admitting: *Deleted

## 2015-04-20 NOTE — Telephone Encounter (Signed)
Patient called c/o low appetite and wanted to know if it was a side effect of the Harvoni. She only has a couple of pills left and her appetite only worsened in the last week. I told her if it was from the medication she would have experienced this sooner. I remember at her last appointment she was talking about trouble gaining weight. She is asking for advise and also if there is anything she can take to increase her appetite. She has an appointment next week; please advise.

## 2015-04-20 NOTE — Telephone Encounter (Signed)
Agree.  Not likely due to th medication or hep C.  She should discuss with her PCP. thanks

## 2015-04-20 NOTE — Telephone Encounter (Signed)
Patient notified

## 2015-04-24 NOTE — Telephone Encounter (Signed)
Review.

## 2015-04-27 ENCOUNTER — Encounter: Payer: Self-pay | Admitting: Internal Medicine

## 2015-04-27 ENCOUNTER — Ambulatory Visit (INDEPENDENT_AMBULATORY_CARE_PROVIDER_SITE_OTHER): Payer: No Typology Code available for payment source | Admitting: Internal Medicine

## 2015-04-27 VITALS — BP 155/111 | HR 98 | Temp 98.4°F | Ht 63.0 in | Wt 99.0 lb

## 2015-04-27 DIAGNOSIS — B182 Chronic viral hepatitis C: Secondary | ICD-10-CM

## 2015-04-27 NOTE — Assessment & Plan Note (Signed)
She is doing well now off the therapy and remains undetectable. I will see her in about 4 months or so and recheck her virus then and this is more than 99% assured of cure. She then could have a repeat hepatitis C viral load and a year or so by her primary physician. She will not need any further follow-up of her liver since she had no liver damage on the elastography.

## 2015-04-27 NOTE — Progress Notes (Signed)
   Subjective:    Patient ID: [name], female    DOB: 20-Sep-1961, 54 y.o.   MRN: [id number]  HPI She comes in for follow-up of her hepatitis C.  She has genotype 1B with an initial viral load of 565,000. Her fibrosis score is F0 to F1. She has now completed her Harvoni. She continues to be undetectable now after treatment completion.  She is pleased with this result.    Review of Systems  Constitutional: Negative for fatigue.  Gastrointestinal: Negative for nausea and diarrhea.  Skin: Negative for rash.  Neurological: Negative for dizziness and light-headedness.       Objective:   Physical Exam  Constitutional: She appears well-developed and well-nourished. No distress.  HENT:  Mouth/Throat: No oropharyngeal exudate.  Eyes: No scleral icterus.  Cardiovascular: Normal rate, regular rhythm and normal heart sounds.   No murmur heard. Pulmonary/Chest: Effort normal and breath sounds normal. No respiratory distress.  Lymphadenopathy:    She has no cervical adenopathy.  Skin: No rash noted.          Assessment & Plan:

## 2015-04-28 ENCOUNTER — Other Ambulatory Visit: Payer: Self-pay | Admitting: Internal Medicine

## 2015-04-28 MED ORDER — LISINOPRIL 10 MG PO TABS: 10.0000 mg | ORAL_TABLET | Freq: Every day | ORAL | Status: DC

## 2015-05-10 ENCOUNTER — Telehealth: Payer: Self-pay | Admitting: *Deleted

## 2015-05-10 NOTE — Telephone Encounter (Signed)
Call from [facility] wanted to let Dr. Naaman Plummer know that patient has picked up her Lisinopril as ordered.  Question about change.  Message was sent to Dr. Naaman Plummer to call the [facility] at the [facility] at [phone].  Sander Nephew, RN 05/10/2015 10:59 AM

## 2015-06-26 ENCOUNTER — Other Ambulatory Visit: Payer: Self-pay | Admitting: *Deleted

## 2015-06-26 DIAGNOSIS — J449 Chronic obstructive pulmonary disease, unspecified: Secondary | ICD-10-CM

## 2015-06-27 ENCOUNTER — Other Ambulatory Visit: Payer: Self-pay | Admitting: Student in an Organized Health Care Education/Training Program

## 2015-06-27 DIAGNOSIS — J449 Chronic obstructive pulmonary disease, unspecified: Secondary | ICD-10-CM

## 2015-06-27 MED ORDER — ALBUTEROL SULFATE HFA 108 (90 BASE) MCG/ACT IN AERS: 2.0000 | INHALATION_SPRAY | Freq: Four times a day (QID) | RESPIRATORY_TRACT | Status: DC | PRN

## 2015-06-27 NOTE — Telephone Encounter (Signed)
Rx faxed in.

## 2015-07-20 ENCOUNTER — Other Ambulatory Visit: Payer: Self-pay | Admitting: Internal Medicine

## 2015-07-20 NOTE — Telephone Encounter (Signed)
Talked with pt - concerned about wt loss since 01/2015. Wt is now 96-98lbs. Pt thought it was from lisinopril. Offered an appt this week and next week - has other appt. Appt sch with Dr Naaman Plummer 07/28/15 1:45PM. Pt will call if any change. Hilda Blades Tynisa Vohs RN 07/20/15 10:20AM

## 2015-07-20 NOTE — Telephone Encounter (Signed)
Pt want to know what are the side effects of taking bp med. Please call pt back.

## 2015-07-28 ENCOUNTER — Encounter: Payer: No Typology Code available for payment source | Admitting: Internal Medicine

## 2015-08-03 ENCOUNTER — Encounter: Payer: Self-pay | Admitting: Internal Medicine

## 2015-08-03 DIAGNOSIS — K8020 Calculus of gallbladder without cholecystitis without obstruction: Secondary | ICD-10-CM | POA: Insufficient documentation

## 2015-08-04 ENCOUNTER — Encounter: Payer: Self-pay | Admitting: Internal Medicine

## 2015-08-04 ENCOUNTER — Ambulatory Visit (INDEPENDENT_AMBULATORY_CARE_PROVIDER_SITE_OTHER): Payer: No Typology Code available for payment source | Admitting: Internal Medicine

## 2015-08-04 VITALS — BP 140/92 | HR 110 | Temp 98.2°F | Wt 98.9 lb

## 2015-08-04 DIAGNOSIS — I10 Essential (primary) hypertension: Secondary | ICD-10-CM

## 2015-08-04 DIAGNOSIS — B182 Chronic viral hepatitis C: Secondary | ICD-10-CM

## 2015-08-04 DIAGNOSIS — Z681 Body mass index (BMI) 19 or less, adult: Secondary | ICD-10-CM

## 2015-08-04 DIAGNOSIS — F329 Major depressive disorder, single episode, unspecified: Secondary | ICD-10-CM

## 2015-08-04 DIAGNOSIS — R636 Underweight: Secondary | ICD-10-CM

## 2015-08-04 DIAGNOSIS — F32A Depression, unspecified: Secondary | ICD-10-CM

## 2015-08-04 DIAGNOSIS — Z23 Encounter for immunization: Secondary | ICD-10-CM

## 2015-08-04 DIAGNOSIS — Z0000 Encounter for general adult medical examination without abnormal findings: Secondary | ICD-10-CM

## 2015-08-04 MED ORDER — MIRTAZAPINE 15 MG PO TABS: 15.0000 mg | ORAL_TABLET | Freq: Every day | ORAL | Status: DC

## 2015-08-04 NOTE — Progress Notes (Signed)
Patient ID: Cynthia Martinez, female   DOB: 1960/12/25, 54 y.o.   MRN: [id number]    Subjective:   Patient ID: Cynthia Martinez female   DOB: [birth year] 54 y.o.   MRN: [id number]  HPI: Cynthia Martinez is a 54 y.o. very pleasant woman with past medical history of hypertension, pernicious anemia, treated hepatitis C infection, underweight, and COPD who presents with chief complaint of weight loss.   She has been underweight for her most of her life with average adult weight 96-98 lb with highest weight of 125 lb when she was pregnant. She has lost 3 lb since last visit five months ago with decreased appetite. She eats about 2 meals a day and is also drinking carnation instant breakfast supplementation. She has tried ensure in the past but did not take it consistently. She reports having her top teeth removed with partial placement in March of this year with resulting difficulty chewing food. She reports depressed mood since June of this year with passing of her father in July. She denies thyroid problems, smoking, or prior colonoscopy. She is up-to-date in screening mammogram and pap smear. She has history of pernicious anemia on oral B12 replacement without history of malabsorption. She denies fever, chills, night sweating, fatigue, or tender lymph nodes.   She has history of chronic hepatitis C 1b infection and had US abdomen with elastography on 09/07/14 that revealed Metavir fibrosis score of F0/F1 with minimal risk of fibrosis. She recently completed Harvoni treatment and is to have repeat viral load in 1 month to ensure viral load is undetectable. She due for her 3rd Hepatitis B shot.   She reports compliance with taking amlodipine and metoprolol tartrate for hypertension. She denies headache, blurry vision, chest pain, LE edema, or lightheadedness.     Past Medical History  Diagnosis Date  . Anemia   . DUB (dysfunctional uterine bleeding)   . Hypertension   . Hep C w/o coma, chronic      Current Outpatient Prescriptions  Medication Sig Dispense Refill  . albuterol (VENTOLIN HFA) 108 (90 BASE) MCG/ACT inhaler Inhale 2 puffs into the lungs every 6 (six) hours as needed for wheezing. 6.7 g 6  . amLODipine (NORVASC) 10 MG tablet Take 1 tablet (10 mg total) by mouth daily. 90 tablet 3  . cetirizine (ZYRTEC) 10 MG tablet Take 1 tablet (10 mg total) by mouth daily. 30 tablet 2  . cyanocobalamin 500 MCG tablet Take 2 tablets (1,000 mcg total) by mouth daily.    . fluticasone (FLONASE) 50 MCG/ACT nasal spray Place 2 sprays into both nostrils daily. 16 g 2  . ibuprofen (ADVIL,MOTRIN) 400 MG tablet Take 1 tablet (400 mg total) by mouth every 6 (six) hours as needed. 30 tablet 0  . lisinopril (PRINIVIL,ZESTRIL) 10 MG tablet Take 1 tablet (10 mg total) by mouth daily. 90 tablet 3   No current facility-administered medications for this visit.   Family History  Problem Relation Age of Onset  . Diabetes     Social History   Social History  . Marital Status: Single    Spouse Name: N/A  . Number of Children: N/A  . Years of Education: N/A   Social History Main Topics  . Smoking status: Former Smoker -- 0.50 packs/day for 9 years    Types: Cigarettes    Quit date: 07/19/2009  . Smokeless tobacco: Never Used  . Alcohol Use: No     Comment: socially  . Drug Use: No  .  Sexual Activity:    Partners: Male    Birth Control/ Protection: Condom   Other Topics Concern  . None   Social History Narrative   Lives with fiancee in [place]   2 daughter in 20's and 30'3.   Worked as Secretary/administrator in past.            Review of Systems: Review of Systems  Constitutional: Positive for weight loss. Negative for fever, chills and malaise/fatigue.       Decreased appetite   HENT: Negative for sore throat.   Eyes: Negative for blurred vision.  Respiratory: Positive for shortness of breath (occasionally ) and wheezing (occasionally). Negative for cough.   Cardiovascular: Negative for chest  pain and leg swelling.  Gastrointestinal: Negative for nausea, vomiting, abdominal pain, diarrhea, constipation and blood in stool.  Genitourinary: Negative for dysuria, urgency, frequency and hematuria.  Musculoskeletal: Negative for joint pain.  Skin: Negative for rash.  Neurological: Negative for dizziness and headaches.  Psychiatric/Behavioral: Positive for depression. The patient does not have insomnia.     Objective:  Physical Exam: Filed Vitals:   08/04/15 1503  Pulse: 110  Temp: 98.2 F (36.8 C)  TempSrc: Oral  Weight: 98 lb 14.4 oz (44.861 kg)  SpO2: 97%    Physical Exam  Constitutional: She is oriented to person, place, and time. No distress.  Thin-appearing  HENT:  Head: Normocephalic and atraumatic.  Right Ear: External ear normal.  Left Ear: External ear normal.  Nose: Nose normal.  Mouth/Throat: Oropharynx is clear and moist. No oropharyngeal exudate.  Eyes: Conjunctivae and EOM are normal. Pupils are equal, round, and reactive to light. Right eye exhibits no discharge. Left eye exhibits no discharge. No scleral icterus.  Neck: Normal range of motion. Neck supple.  Cardiovascular: Normal rate, regular rhythm and normal heart sounds.   Pulmonary/Chest: Effort normal and breath sounds normal. No respiratory distress. She has no wheezes. She has no rales.  Abdominal: Soft. Bowel sounds are normal. She exhibits no distension. There is no tenderness. There is no rebound and no guarding.  Musculoskeletal: Normal range of motion. She exhibits no edema or tenderness.  Neurological: She is alert and oriented to person, place, and time.  Skin: Skin is warm and dry. No rash noted. She is not diaphoretic. No erythema. No pallor.  Psychiatric: She has a normal mood and affect. Her behavior is normal. Judgment and thought content normal.    Assessment & Plan:   Please see problem list for problem-based assessment and plan

## 2015-08-04 NOTE — Progress Notes (Signed)
Internal Medicine Clinic Attending  Case discussed with Dr. Rabbani soon after the resident saw the patient.  We reviewed the resident's history and exam and pertinent patient test results.  I agree with the assessment, diagnosis, and plan of care documented in the resident's note.  

## 2015-08-04 NOTE — Patient Instructions (Signed)
-  Start drinking ensure between meals  -Start taking mirtazapine 15 mg daily at night  -Will give you hepatitis B shot today, please come back for flu and tetanus shot -Will check your bloodwork today and call you with the results  -Will refer you [name] who will call you -Very nice seeing you - please come back in 8 weeks and mail in the stool cards   General Instructions:   Please bring your medicines with you each time you come to clinic.  Medicines may include prescription medications, over-the-counter medications, herbal remedies, eye drops, vitamins, or other pills.   Progress Toward Treatment Goals:  No flowsheet data found.  Self Care Goals & Plans:  No flowsheet data found.  No flowsheet data found.   Care Management & Community Referrals:  No flowsheet data found.

## 2015-08-05 LAB — CBC WITH DIFFERENTIAL/PLATELET
BASOS ABS: 0 10*3/uL (ref 0.0–0.2)
Basos: 0 %
EOS (ABSOLUTE): 0 10*3/uL (ref 0.0–0.4)
Eos: 0 %
Hematocrit: 49 % — ABNORMAL HIGH (ref 34.0–46.6)
Hemoglobin: 15.9 g/dL (ref 11.1–15.9)
Immature Grans (Abs): 0 10*3/uL (ref 0.0–0.1)
Immature Granulocytes: 0 %
LYMPHS ABS: 1.6 10*3/uL (ref 0.7–3.1)
Lymphs: 28 %
MCH: 28 pg (ref 26.6–33.0)
MCHC: 32.4 g/dL (ref 31.5–35.7)
MCV: 86 fL (ref 79–97)
MONOS ABS: 0.5 10*3/uL (ref 0.1–0.9)
Monocytes: 10 %
Neutrophils Absolute: 3.4 10*3/uL (ref 1.4–7.0)
Neutrophils: 62 %
PLATELETS: 310 10*3/uL (ref 150–379)
RBC: 5.67 x10E6/uL — AB (ref 3.77–5.28)
RDW: 14.7 % (ref 12.3–15.4)
WBC: 5.5 10*3/uL (ref 3.4–10.8)

## 2015-08-05 LAB — TSH: TSH: 1.57 u[IU]/mL (ref 0.450–4.500)

## 2015-08-07 DIAGNOSIS — F32A Depression, unspecified: Secondary | ICD-10-CM | POA: Insufficient documentation

## 2015-08-07 DIAGNOSIS — F329 Major depressive disorder, single episode, unspecified: Secondary | ICD-10-CM | POA: Insufficient documentation

## 2015-08-07 NOTE — Assessment & Plan Note (Signed)
Assessment: Pt with 54-month history of depression after passing of her father not on anti-depressant therapy who presents with no suicide ideation.   Plan:  -Prescribe mirtazapine 15 mg daily at bedtime in setting of decreased appetite  -Pt to return in 8 weeks to assess for response

## 2015-08-07 NOTE — Assessment & Plan Note (Addendum)
Assessment: Pt chronically underweight who presents with 3 lb weight loss in last 5 months most likely due to inadequate caloric intake, dentition, and concomitant depression.   Plan:  -BMI 17.57 not at goal 20-25  -Pt encouraged to eat 3 meals daily with ensure nutritional supplementation in between meals -Prescribe mirtazapine 15 mg daily at bedtime to increase appetite in setting of depression  -Last CMP on 04/13/15 was normal  -Obtain TSH level ---> normal -Obtain CBC w/diff --> normal (mildly elevated HCT 49%) -Refer to medical nutrition therapy [name]) for counseling  -Pt to return in 6-8 weeks for follow-up

## 2015-08-07 NOTE — Assessment & Plan Note (Signed)
Assessment: Pt with chronic hepatitis C infection genotype 1b with Metavir fibrosis score F0/F1 s/p 12 weeks treatment with Harvoni with last VL undetectable on 04/13/15 who presents with no symptoms of stigmata of chronic liver disease.   Plan:  -Administer 3rd HBV vaccination today on 08/04/15 -Pt to have repeat HCV VL in 1 month to ensure cure -Pt follows with Dr. Linus Salmons of [place] does not need GI follow-up due to low fibrosis score

## 2015-08-07 NOTE — Assessment & Plan Note (Signed)
-  Pt declines screening colonoscopy but agreeable to home stool card testing  -Administer 3rd hepatitis B vaccination today on 08/04/15 -Obtain annual influenza and tdap vaccination at next visit

## 2015-08-07 NOTE — Assessment & Plan Note (Signed)
Assessment: Pt with moderately well-controlled hypertension compliant with two-class (CCB & BB) anti-hypertensive therapy who presents with blood pressure of 140/92.  Plan:  -BP 140/92 not at goal <140/90  -Continue amlodipine 10 mg daily and metoprolol tartrate 25 mg BID  -Last CMP on 04/13/15 was normal

## 2015-08-17 ENCOUNTER — Ambulatory Visit: Payer: No Typology Code available for payment source

## 2015-08-17 ENCOUNTER — Encounter: Payer: No Typology Code available for payment source | Admitting: Dietician

## 2015-08-18 ENCOUNTER — Ambulatory Visit: Payer: No Typology Code available for payment source

## 2015-08-21 ENCOUNTER — Other Ambulatory Visit: Payer: No Typology Code available for payment source

## 2015-08-24 ENCOUNTER — Other Ambulatory Visit: Payer: No Typology Code available for payment source

## 2015-08-28 ENCOUNTER — Other Ambulatory Visit: Payer: No Typology Code available for payment source

## 2015-08-28 DIAGNOSIS — B182 Chronic viral hepatitis C: Secondary | ICD-10-CM

## 2015-08-28 NOTE — Addendum Note (Signed)
Addended by: Hulan Fray on: 08/28/2015 04:36 PM   Modules accepted: Orders

## 2015-08-30 LAB — HEPATITIS C RNA QUANTITATIVE: HCV Quantitative: NOT DETECTED IU/mL (ref <15)

## 2015-08-31 ENCOUNTER — Ambulatory Visit: Payer: No Typology Code available for payment source | Admitting: Internal Medicine

## 2015-08-31 ENCOUNTER — Ambulatory Visit (INDEPENDENT_AMBULATORY_CARE_PROVIDER_SITE_OTHER): Payer: No Typology Code available for payment source | Admitting: Internal Medicine

## 2015-08-31 ENCOUNTER — Other Ambulatory Visit (INDEPENDENT_AMBULATORY_CARE_PROVIDER_SITE_OTHER): Payer: Self-pay

## 2015-08-31 ENCOUNTER — Encounter: Payer: Self-pay | Admitting: Internal Medicine

## 2015-08-31 VITALS — BP 159/112 | HR 93 | Temp 98.0°F | Ht 63.0 in | Wt 99.5 lb

## 2015-08-31 DIAGNOSIS — R636 Underweight: Secondary | ICD-10-CM

## 2015-08-31 DIAGNOSIS — B182 Chronic viral hepatitis C: Secondary | ICD-10-CM

## 2015-08-31 LAB — POC HEMOCCULT BLD/STL (HOME/3-CARD/SCREEN)
Card #3 Fecal Occult Blood, POC: NEGATIVE
FECAL OCCULT BLD: NEGATIVE
Fecal Occult Blood, POC: NEGATIVE

## 2015-08-31 NOTE — Assessment & Plan Note (Signed)
Now considered cured.  No follow up of hepatitis C and of liver fibrosis.   RTC PRN.

## 2015-08-31 NOTE — Progress Notes (Signed)
   Subjective:    Patient ID: Cynthia Martinez, female    DOB: March 18, 1961, 54 y.o.   MRN: [id number]  HPI She comes in for follow-up of her hepatitis C.  She has genotype 1B with an initial viral load of 565,000. Her fibrosis score is F0 to F1. She has now completed her Harvoni 4 months ago. She has had SVR and remains undetectable so is considered cured.  She is pleased with this result.    Review of Systems  Constitutional: Negative for fatigue.  Gastrointestinal: Negative for nausea and diarrhea.  Skin: Negative for rash.  Neurological: Negative for dizziness and light-headedness.       Objective:   Physical Exam  Constitutional: She appears well-developed and well-nourished. No distress.  HENT:  Mouth/Throat: No oropharyngeal exudate.  Eyes: No scleral icterus.  Cardiovascular: Normal rate, regular rhythm and normal heart sounds.   No murmur heard. Pulmonary/Chest: Effort normal and breath sounds normal. No respiratory distress.  Lymphadenopathy:    She has no cervical adenopathy.  Skin: No rash noted.          Assessment & Plan:

## 2015-09-20 ENCOUNTER — Emergency Department (HOSPITAL_COMMUNITY): Payer: Self-pay

## 2015-09-20 ENCOUNTER — Observation Stay (HOSPITAL_COMMUNITY): Payer: Self-pay

## 2015-09-20 ENCOUNTER — Encounter (HOSPITAL_COMMUNITY): Payer: Self-pay

## 2015-09-20 ENCOUNTER — Inpatient Hospital Stay (HOSPITAL_COMMUNITY)
Admission: EM | Admit: 2015-09-20 | Discharge: 2015-09-24 | DRG: 194 | Disposition: A | Discharge status: Home / Self Care | Payer: Self-pay | Attending: Internal Medicine | Admitting: Internal Medicine

## 2015-09-20 DIAGNOSIS — Z87891 Personal history of nicotine dependence: Secondary | ICD-10-CM

## 2015-09-20 DIAGNOSIS — R000 Tachycardia, unspecified: Secondary | ICD-10-CM | POA: Diagnosis present

## 2015-09-20 DIAGNOSIS — I4581 Long QT syndrome: Secondary | ICD-10-CM | POA: Diagnosis present

## 2015-09-20 DIAGNOSIS — Z882 Allergy status to sulfonamides status: Secondary | ICD-10-CM

## 2015-09-20 DIAGNOSIS — Z880 Allergy status to penicillin: Secondary | ICD-10-CM

## 2015-09-20 DIAGNOSIS — Z79899 Other long term (current) drug therapy: Secondary | ICD-10-CM

## 2015-09-20 DIAGNOSIS — J441 Chronic obstructive pulmonary disease with (acute) exacerbation: Secondary | ICD-10-CM | POA: Diagnosis present

## 2015-09-20 DIAGNOSIS — E876 Hypokalemia: Secondary | ICD-10-CM | POA: Diagnosis present

## 2015-09-20 DIAGNOSIS — R0600 Dyspnea, unspecified: Secondary | ICD-10-CM

## 2015-09-20 DIAGNOSIS — I10 Essential (primary) hypertension: Secondary | ICD-10-CM | POA: Diagnosis present

## 2015-09-20 DIAGNOSIS — D649 Anemia, unspecified: Secondary | ICD-10-CM | POA: Diagnosis present

## 2015-09-20 DIAGNOSIS — R1310 Dysphagia, unspecified: Secondary | ICD-10-CM | POA: Diagnosis present

## 2015-09-20 DIAGNOSIS — J14 Pneumonia due to Hemophilus influenzae: Principal | ICD-10-CM | POA: Diagnosis present

## 2015-09-20 DIAGNOSIS — J189 Pneumonia, unspecified organism: Secondary | ICD-10-CM

## 2015-09-20 DIAGNOSIS — E860 Dehydration: Secondary | ICD-10-CM | POA: Diagnosis present

## 2015-09-20 DIAGNOSIS — B182 Chronic viral hepatitis C: Secondary | ICD-10-CM | POA: Diagnosis present

## 2015-09-20 DIAGNOSIS — R9431 Abnormal electrocardiogram [ECG] [EKG]: Secondary | ICD-10-CM | POA: Diagnosis present

## 2015-09-20 DIAGNOSIS — J449 Chronic obstructive pulmonary disease, unspecified: Secondary | ICD-10-CM

## 2015-09-20 DIAGNOSIS — R0902 Hypoxemia: Secondary | ICD-10-CM | POA: Diagnosis present

## 2015-09-20 HISTORY — DX: Anxiety disorder, unspecified: F41.9

## 2015-09-20 HISTORY — DX: Major depressive disorder, single episode, unspecified: F32.9

## 2015-09-20 HISTORY — DX: Reserved for inherently not codable concepts without codable children: IMO0001

## 2015-09-20 HISTORY — DX: Depression, unspecified: F32.A

## 2015-09-20 HISTORY — DX: Chronic obstructive pulmonary disease, unspecified: J44.9

## 2015-09-20 LAB — CBC
HCT: 45.1 % (ref 36.0–46.0)
Hemoglobin: 15 g/dL (ref 12.0–15.0)
MCH: 27.7 pg (ref 26.0–34.0)
MCHC: 33.3 g/dL (ref 30.0–36.0)
MCV: 83.4 fL (ref 78.0–100.0)
PLATELETS: 314 10*3/uL (ref 150–400)
RBC: 5.41 MIL/uL — AB (ref 3.87–5.11)
RDW: 13.9 % (ref 11.5–15.5)
WBC: 13.7 10*3/uL — ABNORMAL HIGH (ref 4.0–10.5)

## 2015-09-20 LAB — COMPREHENSIVE METABOLIC PANEL
ALBUMIN: 3.5 g/dL (ref 3.5–5.0)
ALT: 11 U/L — AB (ref 14–54)
AST: 18 U/L (ref 15–41)
Alkaline Phosphatase: 94 U/L (ref 38–126)
Anion gap: 16 — ABNORMAL HIGH (ref 5–15)
BUN: 11 mg/dL (ref 6–20)
CHLORIDE: 99 mmol/L — AB (ref 101–111)
CO2: 24 mmol/L (ref 22–32)
Calcium: 9.8 mg/dL (ref 8.9–10.3)
Creatinine, Ser: 0.67 mg/dL (ref 0.44–1.00)
GFR calc Af Amer: >60 mL/min (ref >60)
GFR calc non Af Amer: >60 mL/min (ref >60)
Glucose, Bld: 122 mg/dL — ABNORMAL HIGH (ref 65–99)
Potassium: 3.3 mmol/L — ABNORMAL LOW (ref 3.5–5.1)
SODIUM: 139 mmol/L (ref 135–145)
TOTAL PROTEIN: 8 g/dL (ref 6.5–8.1)
Total Bilirubin: 0.8 mg/dL (ref 0.3–1.2)

## 2015-09-20 LAB — HIV ANTIBODY (ROUTINE TESTING W REFLEX): HIV SCREEN 4TH GENERATION: NONREACTIVE

## 2015-09-20 LAB — URINE MICROSCOPIC-ADD ON

## 2015-09-20 LAB — URINALYSIS, ROUTINE W REFLEX MICROSCOPIC
BILIRUBIN URINE: NEGATIVE
GLUCOSE, UA: NEGATIVE mg/dL
Ketones, ur: 40 mg/dL — AB
NITRITE: NEGATIVE
PH: 6.5 (ref 5.0–8.0)
Protein, ur: NEGATIVE mg/dL
SPECIFIC GRAVITY, URINE: 1.026 (ref 1.005–1.030)
Urobilinogen, UA: 1 mg/dL (ref 0.0–1.0)

## 2015-09-20 LAB — I-STAT TROPONIN, ED: Troponin i, poc: 0 ng/mL (ref 0.00–0.08)

## 2015-09-20 LAB — I-STAT CG4 LACTIC ACID, ED
LACTIC ACID, VENOUS: 1.16 mmol/L (ref 0.5–2.0)
Lactic Acid, Venous: 1.17 mmol/L (ref 0.5–2.0)

## 2015-09-20 LAB — EXPECTORATED SPUTUM ASSESSMENT W GRAM STAIN, RFLX TO RESP C

## 2015-09-20 LAB — EXPECTORATED SPUTUM ASSESSMENT W REFEX TO RESP CULTURE: SPECIAL REQUESTS: NORMAL

## 2015-09-20 LAB — INFLUENZA PANEL BY PCR (TYPE A & B)
H1N1FLUPCR: NOT DETECTED
Influenza A By PCR: NEGATIVE
Influenza B By PCR: NEGATIVE

## 2015-09-20 LAB — MAGNESIUM: MAGNESIUM: 1.9 mg/dL (ref 1.7–2.4)

## 2015-09-20 MED ORDER — DOXYCYCLINE HYCLATE 100 MG IV SOLR
100.0000 mg | Freq: Two times a day (BID) | INTRAVENOUS | Status: DC
  Filled 2015-09-20 (×2): qty 100

## 2015-09-20 MED ORDER — DEXTROSE 5 % IV SOLN
1.0000 g | Freq: Once | INTRAVENOUS | Status: DC
  Filled 2015-09-20: qty 10

## 2015-09-20 MED ORDER — POTASSIUM CHLORIDE CRYS ER 20 MEQ PO TBCR: 40.0000 meq | EXTENDED_RELEASE_TABLET | Freq: Once | ORAL | Status: DC

## 2015-09-20 MED ORDER — IOHEXOL 350 MG/ML SOLN
80.0000 mL | Freq: Once | INTRAVENOUS | Status: AC | PRN
  Administered 2015-09-20: 80 mL via INTRAVENOUS

## 2015-09-20 MED ORDER — POTASSIUM CHLORIDE 10 MEQ/100ML IV SOLN
10.0000 meq | INTRAVENOUS | Status: AC
  Administered 2015-09-20 (×2): 10 meq via INTRAVENOUS
  Filled 2015-09-20 (×2): qty 100

## 2015-09-20 MED ORDER — DOXYCYCLINE HYCLATE 100 MG PO TABS
100.0000 mg | ORAL_TABLET | Freq: Once | ORAL | Status: AC
  Administered 2015-09-20: 100 mg via ORAL
  Filled 2015-09-20: qty 1

## 2015-09-20 MED ORDER — METHYLPREDNISOLONE SODIUM SUCC 125 MG IJ SOLR
125.0000 mg | Freq: Once | INTRAMUSCULAR | Status: AC
  Administered 2015-09-20: 125 mg via INTRAVENOUS
  Filled 2015-09-20: qty 2

## 2015-09-20 MED ORDER — DEXTROSE 5 % IV SOLN
2.0000 g | INTRAVENOUS | Status: DC
  Administered 2015-09-20 – 2015-09-23 (×4): 2 g via INTRAVENOUS
  Filled 2015-09-20 (×4): qty 2

## 2015-09-20 MED ORDER — ACETAMINOPHEN 325 MG PO TABS: 650.0000 mg | ORAL_TABLET | Freq: Four times a day (QID) | ORAL | Status: DC | PRN

## 2015-09-20 MED ORDER — PREDNISONE 20 MG PO TABS: 40.0000 mg | ORAL_TABLET | Freq: Every day | ORAL | Status: DC

## 2015-09-20 MED ORDER — PREDNISONE 5 MG/ML PO CONC
40.0000 mg | Freq: Every day | ORAL | Status: DC
  Administered 2015-09-21 – 2015-09-24 (×4): 40 mg via ORAL
  Filled 2015-09-20 (×6): qty 8

## 2015-09-20 MED ORDER — SODIUM CHLORIDE 0.9 % IV BOLUS (SEPSIS)
1000.0000 mL | Freq: Once | INTRAVENOUS | Status: AC
  Administered 2015-09-20: 1000 mL via INTRAVENOUS

## 2015-09-20 MED ORDER — HEPARIN SODIUM (PORCINE) 5000 UNIT/ML IJ SOLN
5000.0000 [IU] | Freq: Three times a day (TID) | INTRAMUSCULAR | Status: DC
  Administered 2015-09-20 – 2015-09-24 (×11): 5000 [IU] via SUBCUTANEOUS
  Filled 2015-09-20 (×12): qty 1

## 2015-09-20 MED ORDER — DOXYCYCLINE HYCLATE 100 MG PO TABS: 100.0000 mg | ORAL_TABLET | Freq: Two times a day (BID) | ORAL | Status: DC

## 2015-09-20 MED ORDER — PREDNISONE 5 MG/ML PO CONC
40.0000 mg | Freq: Every day | ORAL | Status: DC
  Filled 2015-09-20: qty 8

## 2015-09-20 MED ORDER — IPRATROPIUM-ALBUTEROL 0.5-2.5 (3) MG/3ML IN SOLN
3.0000 mL | Freq: Four times a day (QID) | RESPIRATORY_TRACT | Status: DC
  Administered 2015-09-20 – 2015-09-21 (×3): 3 mL via RESPIRATORY_TRACT
  Filled 2015-09-20 (×3): qty 3

## 2015-09-20 MED ORDER — IPRATROPIUM-ALBUTEROL 0.5-2.5 (3) MG/3ML IN SOLN
3.0000 mL | Freq: Once | RESPIRATORY_TRACT | Status: AC
  Administered 2015-09-20: 3 mL via RESPIRATORY_TRACT
  Filled 2015-09-20: qty 3

## 2015-09-20 MED ORDER — AMLODIPINE BESYLATE 10 MG PO TABS
10.0000 mg | ORAL_TABLET | Freq: Every day | ORAL | Status: DC
  Administered 2015-09-20 – 2015-09-24 (×5): 10 mg via ORAL
  Filled 2015-09-20 (×5): qty 1

## 2015-09-20 MED ORDER — DOXYCYCLINE HYCLATE 100 MG PO TABS
100.0000 mg | ORAL_TABLET | Freq: Two times a day (BID) | ORAL | Status: DC
  Administered 2015-09-20 – 2015-09-21 (×3): 100 mg via ORAL
  Filled 2015-09-20 (×3): qty 1

## 2015-09-20 MED ORDER — POTASSIUM CHLORIDE 10 MEQ/100ML IV SOLN
10.0000 meq | INTRAVENOUS | Status: DC
  Administered 2015-09-20 (×2): 10 meq via INTRAVENOUS
  Filled 2015-09-20 (×2): qty 100

## 2015-09-20 MED ORDER — SODIUM CHLORIDE 0.9 % IJ SOLN
3.0000 mL | Freq: Two times a day (BID) | INTRAMUSCULAR | Status: DC
  Administered 2015-09-20 – 2015-09-24 (×9): 3 mL via INTRAVENOUS

## 2015-09-20 MED ORDER — POTASSIUM CHLORIDE 10 MEQ/100ML IV SOLN: 10.0000 meq | INTRAVENOUS | Status: DC

## 2015-09-20 MED ORDER — ALBUTEROL SULFATE (2.5 MG/3ML) 0.083% IN NEBU
2.5000 mg | INHALATION_SOLUTION | RESPIRATORY_TRACT | Status: DC | PRN
  Filled 2015-09-20: qty 3

## 2015-09-20 MED ORDER — LISINOPRIL 10 MG PO TABS
10.0000 mg | ORAL_TABLET | Freq: Every day | ORAL | Status: DC
  Administered 2015-09-20 – 2015-09-24 (×5): 10 mg via ORAL
  Filled 2015-09-20 (×5): qty 1

## 2015-09-20 MED ORDER — ACETAMINOPHEN 650 MG RE SUPP: 650.0000 mg | Freq: Four times a day (QID) | RECTAL | Status: DC | PRN

## 2015-09-20 MED ORDER — IBUPROFEN 400 MG PO TABS
400.0000 mg | ORAL_TABLET | Freq: Four times a day (QID) | ORAL | Status: DC | PRN
  Filled 2015-09-20: qty 1

## 2015-09-20 NOTE — ED Notes (Signed)
Pt took tylenol for fever last night around 1500.

## 2015-09-20 NOTE — Progress Notes (Signed)
Patient arrived from ED via stretcher. Patient alert, oriented and ambulatory with stand-by assistance. Patient only complaint is feeling as though the pill she choked on in ED was still stuck in her esophagus.  Admission weight and vitals taken and documented. Patient's BP and pulse both elevated, MD aware. Will continue to monitor. Blood pressure 156/100, pulse 138, temperature 99 F (37.2 C), temperature source Oral, resp. rate 26, height 5\' 3"  (1.6 m), weight 42.774 kg (94 lb 4.8 oz), SpO2 100 %. Tresa Endo

## 2015-09-20 NOTE — H&P (Signed)
Date: 09/20/2015               Patient Name:  Cynthia Martinez MRN: [id number]  DOB: Mar 09, 1961 Age / Sex: 54 y.o., female   PCP: Juluis Mire, MD         Medical Service: Internal Medicine Teaching Service         Attending Physician: Dr. Aldine Contes, MD    First Contact: Dr. Berline Lopes, MD Pager: [phone]  Second Contact: Dr. Dellia Nims, MD Pager: [phone]       After Hours (After 5p/  First Contact Pager: [phone]  weekends / holidays): Second Contact Pager: [phone]   Chief Complaint: Shortness of Breath  History of Present Illness:  Cynthia Martinez is a 54 year old woman with a past medical history of COPD (2013 FEV1 0.9L/41%, ratio 42, DLCO 14/58% per pulmonology note) per pulmonology note), previously treated HCV (followed by Dr. Linus Salmons, undetectable HCV levels), and a chronic cough who presents with shortness of breath. This problem started on 10/27 and has been associated with headache, an elevated temperature (Tmax at home 100.1), body aches, yellow sputum production, sore throat, and chills. She has taken Robitussin DM to minimal or no effect. She has also noticed her heart rate and blood pressure have increased, taking her blood pressure at home at it was 160/103. She takes only ventolin inhalers for her COPD at home, only needing to take use it once per month. Since the onset of her symptoms, she has needed to use it three times daily. She also endorses some weight loss (6-7 pounds) since March while she was on Harvoni. Otherwise, she denies any chest pain, light-headedness, blackouts, nausea, vomiting, diarrhea, abdominal pain, or sick contacts. She is a former smoker (16.5 pack-year history, quit 2013), and is a non-drinker. Other than her inhaler, she only takes her mirtazapine (started in September), lisinopril, and amlodipine.   In the ED, she was noted to be tachycardic (116-138), hypertensive (up to 165/105), 93% on room air. She had a leukocytosis to 13.7. A CXR  demonstrated emphysematous changes without consolidation. A CT Angio showed no pulmonary embolism, but exhibited diffuse nodularity and bronchial thickening suggestive of an atypical or viral pneumonia. She was given 125 mg solumedrol in the ED. She was administered oral doxycycline, but she could not tolerate. An EKG demonstrated a HR 132 and QTc 574. Blood cultures were ordered.  Meds: Current Facility-Administered Medications  Medication Dose Route Frequency Provider Last Rate Last Dose  . cefTRIAXone (ROCEPHIN) 1 g in dextrose 5 % 50 mL IVPB  1 g Intravenous Once Sharlett Iles, MD   1 g at 09/20/15 9021   Current Outpatient Prescriptions  Medication Sig Dispense Refill  . acetaminophen (TYLENOL) 650 MG CR tablet Take 650 mg by mouth every 8 (eight) hours as needed for pain.    Marland Kitchen albuterol (VENTOLIN HFA) 108 (90 BASE) MCG/ACT inhaler Inhale 2 puffs into the lungs every 6 (six) hours as needed for wheezing. 6.7 g 6  . amLODipine (NORVASC) 10 MG tablet Take 1 tablet (10 mg total) by mouth daily. 90 tablet 3  . fluticasone (FLONASE) 50 MCG/ACT nasal spray Place 2 sprays into both nostrils daily. (Patient taking differently: Place 2 sprays into both nostrils daily as needed for allergies. ) 16 g 2  . ibuprofen (ADVIL,MOTRIN) 400 MG tablet Take 1 tablet (400 mg total) by mouth every 6 (six) hours as needed. (Patient taking differently: Take 400 mg by mouth every 6 (six)  hours as needed for fever or moderate pain. ) 30 tablet 0  . lisinopril (PRINIVIL,ZESTRIL) 10 MG tablet Take 1 tablet (10 mg total) by mouth daily. 90 tablet 3  . mirtazapine (REMERON) 15 MG tablet Take 1 tablet (15 mg total) by mouth at bedtime. 30 tablet 2    Allergies: Allergies as of 09/20/2015 - Review Complete 09/20/2015  Allergen Reaction Noted  . Hydrochlorothiazide Hives 12/19/2011  . Penicillins Hives 11/30/2011  . Sulfa antibiotics Hives 11/30/2011   Past Medical History  Diagnosis Date  . Anemia   . DUB  (dysfunctional uterine bleeding)   . Hypertension   . Hep C w/o coma, chronic (HCC)    Past Surgical History  Procedure Laterality Date  . Polypectomy     Family History  Problem Relation Age of Onset  . Diabetes     Social History   Social History  . Marital Status: Single    Spouse Name: N/A  . Number of Children: N/A  . Years of Education: N/A   Occupational History  . Not on file.   Social History Main Topics  . Smoking status: Former Smoker -- 0.50 packs/day for 9 years    Types: Cigarettes    Quit date: 07/19/2009  . Smokeless tobacco: Never Used  . Alcohol Use: No     Comment: socially  . Drug Use: No  . Sexual Activity:    Partners: Male    Birth Control/ Protection: Condom   Other Topics Concern  . Not on file   Social History Narrative   Lives with fiancee in [place]   2 daughter in 31's and 33'3.   Worked as Secretary/administrator in past.             Review of Systems: Negative except per HPI  Physical Exam: Blood pressure 133/97, pulse 125, temperature 99.1 F (37.3 C), temperature source Oral, resp. rate 25, SpO2 95 %. General: Lying in bed, no acute distress HEENT: Moist mucous membranes, mild tonsillar erythema with no exudates, mildly injected right eye, PERRL, EOMI Cardiology: Tachycardic with regular rhythm. No m/r/g Pulmonary: Diffuse crackles. No wheezes. Unlabored breathing Abdomen: Soft, non-tender, non-distended. Normal bowel sounds. Skin: Mildly diaphoretic. Warm, no rashes Extremities: No clubbing, cyanosis, or edema. No calf tenderness. Normal distal pulses.  Neurological: Tongue midline. Face symmetric. Cooperative to exam. Psychiatric: Affect and behavior appropriate   Lab results: Basic Metabolic Panel:  Recent Labs  09/20/15 0115  NA 139  K 3.3*  CL 99*  CO2 24  GLUCOSE 122*  BUN 11  CREATININE 0.67  CALCIUM 9.8   Liver Function Tests:  Recent Labs  09/20/15 0115  AST 18  ALT 11*  ALKPHOS 94  BILITOT 0.8  PROT 8.0   ALBUMIN 3.5   CBC:  Recent Labs  09/20/15 0115  WBC 13.7*  HGB 15.0  HCT 45.1  MCV 83.4  PLT 314    Imaging results:  Dg Chest 2 View  09/20/2015  CLINICAL DATA:  Shortness of breath. Fever and cough since last Thursday. EXAM: CHEST  2 VIEW COMPARISON:  03/30/2014 FINDINGS: Hyperinflation in the lungs suggesting emphysema. No focal airspace disease or consolidation. Central interstitial changes with peribronchial thickening suggesting chronic bronchitis. Calcifications in the upper lungs suggesting calcified granulomas. No blunting of costophrenic angles. No pneumothorax. Normal heart size and pulmonary vascularity. Thoracic scoliosis convex towards the right. Mediastinal contours appear intact. IMPRESSION: Emphysematous changes and chronic bronchitic changes in the lungs. No evidence of active consolidation. Electronically Signed  By: Lucienne Capers M.D.   On: 09/20/2015 01:17   Ct Angio Chest Pe W/cm &/or Wo Cm  09/20/2015  CLINICAL DATA:  Tachycardia and hypoxia.  Flu like symptoms. EXAM: CT ANGIOGRAPHY CHEST WITH CONTRAST TECHNIQUE: Multidetector CT imaging of the chest was performed using the standard protocol during bolus administration of intravenous contrast. Multiplanar CT image reconstructions and MIPs were obtained to evaluate the vascular anatomy. CONTRAST:  20mL OMNIPAQUE IOHEXOL 350 MG/ML SOLN COMPARISON:  09/20/2015 radiographs FINDINGS: Cardiovascular: There is good opacification of the pulmonary arteries. There is no pulmonary embolism. The thoracic aorta is normal in caliber and intact. Lungs: Fine interstitial nodularity is scattered throughout both lungs, greatest in the right middle lobe base and left lower lobe posterior base were there is some confluence. This most likely is an infectious or inflammatory process, probably an atypical or viral pneumonia given the clinical history. Central airways: Patent. Mild bronchial wall thickening, particularly in the lower lobes.  Effusions: None Lymphadenopathy: None Esophagus: Unremarkable Upper abdomen: Unremarkable Musculoskeletal: No significant abnormality Review of the MIP images confirms the above findings. IMPRESSION: 1. Negative for acute pulmonary embolism. 2. Diffuse nodularity and mild bronchial thickening, suspicious for atypical or viral pneumonia in this clinical setting. Electronically Signed   By: Andreas Newport M.D.   On: 09/20/2015 04:24    Assessment & Plan by Problem:  COPD Exacerbation: Patient has severe COPD based on pulmonologist reported PFTs, but functionally her symptoms seem mild at baseline. She had been followed by Dr. Chase Caller in the past. This exacerbation seems to have been incited by a viral or atypical pneumonia based on CXR and CT. Therefore, we will initiate antibiotic therapy to cover atypical organisms. I do not believe strep testing is necessary given the lack of consolidation by Day 5 of her symptoms. - Doxycycline 100 mg IV q12h - cannot tolerate orally and has historically crushed her pills - Prednisone 40 mg po for 4 days (last dose on 11/6) - Duoneb q6h, Albuterol q2h prn - Urine Legionella pending - HIV antibody pending - one previous reactive Ab test (2013), with quantitative assays negative and Ab assays non-reactive - Repeat CBC - Repeat PFTs - likely as an outpatient - Follow-up with Dr. Chase Caller  Hypokalemia: 3.3. Likely due to increased albuterol usage. - KCl 10 mEq, 4 runs. Cannot tolerate PO. - Checking magnesium.  Tachycardia: Has been as high at 160s here, but has generally stayed in the 130s. Visit at the [facility] in September she was at 110. PE has been ruled out. Likely related to her COPD exacerbation. Will continue to monitor for improvement with resolution of her exacerbation.  Long QT: QTc of 574 on EKG. No previous EKG for comparison. Recently started on mirtazapine (last dose Monday evening) and took DXM for cough (last dose Tuesday). - Repeat EKG after  tachycardia resolves  Hypertension: 140/92 at her last clinic visit and reports systolics in the 124P.  - Continue home lisinopril and amlodipine  DVT Prophylaxis: Heparin [place] Diet: Heart Healthy Code Status: Full  Dispo: Disposition is deferred at this time, awaiting improvement of current medical problems. Anticipated discharge in approximately 2-3 day(s).   The patient does have a current PCP (Juluis Mire, MD) and does need an [facility] hospital follow-up appointment after discharge.  The patient does not have transportation limitations that hinder transportation to clinic appointments.  Signed: Liberty Handy, MD 09/20/2015, 5:49 AM

## 2015-09-20 NOTE — Evaluation (Signed)
Clinical/Bedside Swallow Evaluation Patient Details  Name: Cynthia Martinez MRN: [id number] Date of Birth: [birth year]  Today's Date: 09/20/2015 Time: SLP Start Time (ACUTE ONLY): 1045 SLP Stop Time (ACUTE ONLY): 1109 SLP Time Calculation (min) (ACUTE ONLY): 24 min  Past Medical History:  Past Medical History  Diagnosis Date  . Anemia   . DUB (dysfunctional uterine bleeding)   . Hypertension   . Hep C w/o coma, chronic ([place])   . COPD (chronic obstructive pulmonary disease) ([place])   . Shortness of breath dyspnea   . Depression   . Anxiety    Past Surgical History:  Past Surgical History  Procedure Laterality Date  . Polypectomy     HPI:  54 yo female with PMH of COPD, treated HCV, and chronic cough who presents with SOB. CXR without clear consolidation, although CT Angio suggestive of an atypical or viral PNA. Pt had a choking episode in the ED when trying to swallow her pill. Upon chart review, note that pt has been describing difficulty with swallowing pills specifically for over the past year.   Assessment / Plan / Recommendation Clinical Impression  Pt's oropharyngeal swallow appears WFL, although she continues to have a globus sensation from the pill she swallowed while in the ED. She has been sipping on warm tea, which she believes has helped. Suspect a primary esophageal component for her dysphagia - MD may wish to pursue further work up. SLP reviewed esophageal precautions and general aspiration precautions for pt to use for now. Given concern for possible PNA, will f/u briefly for tolerance with regular diet and thin liquids.    Aspiration Risk  Mild    Diet Recommendation Age appropriate regular solids;Thin   Medication Administration: Crushed with puree Compensations: Slow rate;Small sips/bites;Follow solids with liquid    Other  Recommendations Recommended Consults: Consider esophageal assessment Oral Care Recommendations: Oral care BID    Follow Up  Recommendations    None   Frequency and Duration min 1 x/week  1 week   Pertinent Vitals/Pain n/a    SLP Swallow Goals     Swallow Study Prior Functional Status       General Other Pertinent Information: 54 yo female with PMH of COPD, treated HCV, and chronic cough who presents with SOB. CXR without clear consolidation, although CT Angio suggestive of an atypical or viral PNA. Pt had a choking episode in the ED when trying to swallow her pill. Upon chart review, note that pt has been describing difficulty with swallowing pills specifically for over the past year. Type of Study: Bedside swallow evaluation Previous Swallow Assessment: none in chart Diet Prior to this Study: Regular;Thin liquids Temperature Spikes Noted:  (99.6) Respiratory Status: Room air History of Recent Intubation: No Behavior/Cognition: Alert;Cooperative;Pleasant mood Oral Cavity - Dentition: Adequate natural dentition/normal for age Self-Feeding Abilities: Able to feed self Patient Positioning: Upright in bed Baseline Vocal Quality: Normal    Oral/Motor/Sensory Function Overall Oral Motor/Sensory Function: Appears within functional limits for tasks assessed   Ice Chips Ice chips: Not tested   Thin Liquid Thin Liquid: Within functional limits Presentation: Cup;Self Fed    Nectar Thick Nectar Thick Liquid: Not tested   Honey Thick Honey Thick Liquid: Not tested   Puree Puree: Within functional limits Presentation: Self Fed;Spoon   Solid   GO Functional Assessment Tool Used: skilled clinical judgment Functional Limitations: Swallowing Swallow Current Status ([id number]): At least 1 percent but less than 20 percent impaired, limited or restricted Swallow Goal Status [phone]):  At least 1 percent but less than 20 percent impaired, limited or restricted  Solid: Within functional limits Presentation: Self Fed        Germain Osgood, M.A. CCC-SLP [phone]  Germain Osgood 09/20/2015,12:01  PM

## 2015-09-20 NOTE — ED Notes (Signed)
Patient transported to CT 

## 2015-09-20 NOTE — ED Notes (Signed)
Pt sts cannot swallow pills good so asked for apple sauce; pt attempted to swallow pill and apple sauce and began choking; Husband at bedside jerked patient up and began performing the heimlich manuver; pt able to maintain airway, stable 02 sats and saliva throughout episode; pt frequently coughing stating pill is stuck in her esophagus; pt offered PO fluids to swallow pill but refused; Heber Wagner, MD notified of situation- no further orders or interventions ordered

## 2015-09-20 NOTE — ED Notes (Signed)
3E RN Elmo Putt) informed of choking episode and current VS

## 2015-09-20 NOTE — ED Provider Notes (Signed)
CSN: [id number]     Arrival date & time 09/20/15  0039 History  By signing my name below, I, Soijett Blue, attest that this documentation has been prepared under the direction and in the presence of Sharlett Iles, MD. Electronically Signed: Soijett Blue, ED Scribe. 09/20/2015. 1:29 AM.   Chief Complaint  Patient presents with  . Nasal Congestion  . Shortness of Breath      The history is provided by the patient. No language interpreter was used.    Cynthia Martinez is a 54 y.o. female with a medical hx of HTN, treated Hepatitis C, and bronchitis, who presents to the Emergency Department complaining of nasal congestion onset 6 days. She notes that her symptoms began with chills and generalized body aches. She states that she is having associated symptoms of sore throat, fever, productive cough x thick yellow sputum, gradual onset worsening HA, exertional SOB, mild constant chest tightness, fever of 100.1 x today, chills, and generalized body aches. She states that she has tried tylenol at 3 PM PTA with no relief for her symptoms. She denies rash, neck stiffness, vomiting, diarrhea, and any other symptoms.   Denies sick contacts, recent travel, extended car ride/planes flight, hormone therapy, recent hospitalizations, or blood clot. She notes that she is allergic to penicillin and sulfa abx and she will have hives and itching. Denies hx of heart arrhthymia and she notes that she takes her HTN medications daily. She notes that she was recently seen for further evaluation of her hepatitis C and was informed that it was resolved/treated at this time. Pt denies smoking currently but she notes that she used to smoke occasionally in the past and that her husband and her father used to smoke a lot.   Past Medical History  Diagnosis Date  . Anemia   . DUB (dysfunctional uterine bleeding)   . Hypertension   . Hep C w/o coma, chronic (HCC)    Past Surgical History  Procedure Laterality Date  .  Polypectomy     Family History  Problem Relation Age of Onset  . Diabetes     Social History  Substance Use Topics  . Smoking status: Former Smoker -- 0.50 packs/day for 9 years    Types: Cigarettes    Quit date: 07/19/2009  . Smokeless tobacco: Never Used  . Alcohol Use: No     Comment: socially   OB History    No data available     Review of Systems  10 Systems reviewed and all are negative for acute change except as noted in the HPI.   Allergies  Hydrochlorothiazide; Penicillins; and Sulfa antibiotics  Home Medications   Prior to Admission medications   Medication Sig Start Date End Date Taking? Authorizing Provider  acetaminophen (TYLENOL) 650 MG CR tablet Take 650 mg by mouth every 8 (eight) hours as needed for pain.   Yes Historical Provider, MD  albuterol (VENTOLIN HFA) 108 (90 BASE) MCG/ACT inhaler Inhale 2 puffs into the lungs every 6 (six) hours as needed for wheezing. 06/27/15 09/16/17 Yes Axel Filler, MD  amLODipine (NORVASC) 10 MG tablet Take 1 tablet (10 mg total) by mouth daily. 08/19/14 09/20/15 Yes Marjan Rabbani, MD  fluticasone (FLONASE) 50 MCG/ACT nasal spray Place 2 sprays into both nostrils daily. Patient taking differently: Place 2 sprays into both nostrils daily as needed for allergies.  09/16/14  Yes Juluis Mire, MD  ibuprofen (ADVIL,MOTRIN) 400 MG tablet Take 1 tablet (400 mg total) by mouth  every 6 (six) hours as needed. Patient taking differently: Take 400 mg by mouth every 6 (six) hours as needed for fever or moderate pain.  06/09/14  Yes Domenic Moras, PA-C  lisinopril (PRINIVIL,ZESTRIL) 10 MG tablet Take 1 tablet (10 mg total) by mouth daily. 04/28/15  Yes Juluis Mire, MD  mirtazapine (REMERON) 15 MG tablet Take 1 tablet (15 mg total) by mouth at bedtime. 08/04/15 08/03/16 Yes Marjan Rabbani, MD   BP 150/96 mmHg  Pulse 131  Temp(Src) 99.1 F (37.3 C) (Oral)  Resp 24  SpO2 96% Physical Exam  Constitutional: She is oriented to person,  place, and time. She appears well-developed and well-nourished. No distress.  Uncomfortable. Mildly dyspneic   HENT:  Head: Normocephalic and atraumatic.  Mouth/Throat: Oropharynx is clear and moist.  Moist mucous membranes  Eyes: Conjunctivae are normal. Pupils are equal, round, and reactive to light.  Neck: Neck supple.  Cardiovascular: Regular rhythm and normal heart sounds.  Tachycardia present.   No murmur heard. Pulmonary/Chest: She has decreased breath sounds. She has wheezes.  Mildly dyspneic with mild increased work of breathing. Faint wheezes left upper lung. Diminished breathe sounds right lower lung.   Abdominal: Soft. Bowel sounds are normal. She exhibits no distension. There is no tenderness.  Musculoskeletal: She exhibits no edema.  Neurological: She is alert and oriented to person, place, and time.  Fluent speech  Skin: Skin is warm and dry.  Psychiatric: She has a normal mood and affect. Judgment normal.  Nursing note and vitals reviewed.   ED Course  Procedures (including critical care time) DIAGNOSTIC STUDIES: Oxygen Saturation is 92% on RA, low by my interpretation.    COORDINATION OF CARE: 1:28 AM Discussed treatment plan with pt at bedside which includes duoneb, CXR, EKG, solu-medrol, and labs and pt agreed to plan.  Medications  cefTRIAXone (ROCEPHIN) 1 g in dextrose 5 % 50 mL IVPB (not administered)  doxycycline (VIBRA-TABS) tablet 100 mg (not administered)  sodium chloride 0.9 % bolus 1,000 mL (0 mLs Intravenous Stopped 09/20/15 0319)  ipratropium-albuterol (DUONEB) 0.5-2.5 (3) MG/3ML nebulizer solution 3 mL (3 mLs Nebulization Given 09/20/15 0140)  methylPREDNISolone sodium succinate (SOLU-MEDROL) 125 mg/2 mL injection 125 mg (125 mg Intravenous Given 09/20/15 0143)  iohexol (OMNIPAQUE) 350 MG/ML injection 80 mL (80 mLs Intravenous Contrast Given 09/20/15 0350)     Labs Review Labs Reviewed  CBC - Abnormal; Notable for the following:    WBC 13.7 (*)     RBC 5.41 (*)    All other components within normal limits  COMPREHENSIVE METABOLIC PANEL - Abnormal; Notable for the following:    Potassium 3.3 (*)    Chloride 99 (*)    Glucose, Bld 122 (*)    ALT 11 (*)    Anion gap 16 (*)    All other components within normal limits  CULTURE, BLOOD (ROUTINE X 2)  CULTURE, BLOOD (ROUTINE X 2)  URINE CULTURE  CULTURE, EXPECTORATED SPUTUM-ASSESSMENT  INFLUENZA PANEL BY PCR (TYPE A & B, H1N1)  URINALYSIS, ROUTINE W REFLEX MICROSCOPIC (NOT AT [facility])  I-STAT CG4 LACTIC ACID, ED  I-STAT TROPOININ, ED  I-STAT CG4 LACTIC ACID, ED    Imaging Review Dg Chest 2 View  09/20/2015  CLINICAL DATA:  Shortness of breath. Fever and cough since last Thursday. EXAM: CHEST  2 VIEW COMPARISON:  03/30/2014 FINDINGS: Hyperinflation in the lungs suggesting emphysema. No focal airspace disease or consolidation. Central interstitial changes with peribronchial thickening suggesting chronic bronchitis. Calcifications in the upper lungs  suggesting calcified granulomas. No blunting of costophrenic angles. No pneumothorax. Normal heart size and pulmonary vascularity. Thoracic scoliosis convex towards the right. Mediastinal contours appear intact. IMPRESSION: Emphysematous changes and chronic bronchitic changes in the lungs. No evidence of active consolidation. Electronically Signed   By: Lucienne Capers M.D.   On: 09/20/2015 01:17   Ct Angio Chest Pe W/cm &/or Wo Cm  09/20/2015  CLINICAL DATA:  Tachycardia and hypoxia.  Flu like symptoms. EXAM: CT ANGIOGRAPHY CHEST WITH CONTRAST TECHNIQUE: Multidetector CT imaging of the chest was performed using the standard protocol during bolus administration of intravenous contrast. Multiplanar CT image reconstructions and MIPs were obtained to evaluate the vascular anatomy. CONTRAST:  63mL OMNIPAQUE IOHEXOL 350 MG/ML SOLN COMPARISON:  09/20/2015 radiographs FINDINGS: Cardiovascular: There is good opacification of the pulmonary arteries. There is no  pulmonary embolism. The thoracic aorta is normal in caliber and intact. Lungs: Fine interstitial nodularity is scattered throughout both lungs, greatest in the right middle lobe base and left lower lobe posterior base were there is some confluence. This most likely is an infectious or inflammatory process, probably an atypical or viral pneumonia given the clinical history. Central airways: Patent. Mild bronchial wall thickening, particularly in the lower lobes. Effusions: None Lymphadenopathy: None Esophagus: Unremarkable Upper abdomen: Unremarkable Musculoskeletal: No significant abnormality Review of the MIP images confirms the above findings. IMPRESSION: 1. Negative for acute pulmonary embolism. 2. Diffuse nodularity and mild bronchial thickening, suspicious for atypical or viral pneumonia in this clinical setting. Electronically Signed   By: Andreas Newport M.D.   On: 09/20/2015 04:24   I have personally reviewed and evaluated these images and lab results as part of my medical decision-making.    MDM   Final diagnoses:  Atypical pneumonia   54 year old female who presents with several days of sore throat, nasal congestion, low-grade fevers, and productive cough as well as generalized body aches. Patient uncomfortable but nontoxic in appearance at presentation. She was afebrile the vital signs notable for tachycardia at 131 and O2 sat 92% on room air. She had scattered faint wheezes and diminished breath sounds in right lung. Obtained above labs as well as EKG and chest x-ray. EKG showed no ischemic changes but did note prolonged QT and sinus tachycardia. Chest x-ray showed changes consistent with emphysema but no consolidation. Gave the patient a DuoNeb, IV fluid bolus, and Solu-Medrol given her wheezing and productive cough. On reexamination, her vital signs remained abnormal. Obtained a CTA of the chest to evaluate for occult infiltrate versus PE. CT showed atypical versus viral pneumonia.  Influenza PCR negative and troponin negative. Lactate normal. Obtained cultures and ordered sputum culture and gave the patient ceftriaxone and doxycycline for community-acquired pneumonia treatment. Because of the patient's ongoing mild hypoxia and tachycardia, she will be admitted to internal medicine for further care. I personally performed the services described in this documentation, which was scribed in my presence. The recorded information has been reviewed and is accurate.    Sharlett Iles, MD 09/20/15 [phone]

## 2015-09-20 NOTE — ED Notes (Signed)
Patient transported to X-ray 

## 2015-09-20 NOTE — ED Notes (Signed)
Pt here for sore throat, nasal congestion, fever of 100.1, productive cough of thick yellow phlegm and generalized body aches "all over my body." Pt states she took her BP and HR at home and BP was 138/110 and HR-124. Pt is concerned about her HR being high.

## 2015-09-20 NOTE — Progress Notes (Signed)
Subjective: Patient was seen and examined at bedside this AM. State her breathing is much better and denies having any SOB or CP. States her cough has also improved. Patient states she has always had difficulty swallowing pills. States she has no difficulty swallowing food because she always chews it well. Denies having any difficulty swallowing liquids. Denies having odynphagia.   Objective: Vital signs in last 24 hours: Filed Vitals:   09/20/15 0515 09/20/15 0545 09/20/15 0600 09/20/15 0631  BP: 133/97 149/87 165/105 156/100  Pulse: 125 127 138   Temp:    99 F (37.2 C)  TempSrc:    Oral  Resp: 25 22 29 26   Height:    5\' 3"  (1.6 m)  Weight:    94 lb 4.8 oz (42.774 kg)  SpO2: 95% 94% 91% 100%   Weight change:  No intake or output data in the 24 hours ending 09/20/15 0842  Physical exam  General: Lying in bed, no acute distress HEENT: Moist mucous membranes, mild oropharyngeal erythema with no exudates PERRL, EOMI Cardiology: Tachycardic with regular rhythm. No m/r/g Pulmonary: Mild scattered wheezes. No crackles appreciated. Unlabored breathing.  Abdomen: Soft, non-tender, non-distended. Normal bowel sounds. Skin: Warm and dry. Extremities: No clubbing, cyanosis, or edema. No calf tenderness. Normal distal pulses.   Lab Results: Basic Metabolic Panel:  Recent Labs Lab 09/20/15 0115 09/20/15 0708  NA 139  --   K 3.3*  --   CL 99*  --   CO2 24  --   GLUCOSE 122*  --   BUN 11  --   CREATININE 0.67  --   CALCIUM 9.8  --   MG  --  1.9   Liver Function Tests:  Recent Labs Lab 09/20/15 0115  AST 18  ALT 11*  ALKPHOS 94  BILITOT 0.8  PROT 8.0  ALBUMIN 3.5   CBC:  Recent Labs Lab 09/20/15 0115  WBC 13.7*  HGB 15.0  HCT 45.1  MCV 83.4  PLT 314    Micro Results: Recent Results (from the past 240 hour(s))  Culture, expectorated sputum-assessment     Status: None   Collection Time: 09/20/15  5:19 AM  Result Value Ref Range Status   Specimen  Description SPUTUM  Final   Special Requests Normal  Final   Sputum evaluation   Final    THIS SPECIMEN IS ACCEPTABLE. RESPIRATORY CULTURE REPORT TO FOLLOW.   Report Status 09/20/2015 FINAL  Final  Culture, blood (routine x 2)     Status: None (Preliminary result)   Collection Time: 09/20/15  5:31 AM  Result Value Ref Range Status   Specimen Description BLOOD RIGHT FOREARM  Final   Special Requests BOTTLES DRAWN AEROBIC AND ANAEROBIC 5CC  Final   Culture PENDING  Incomplete   Report Status PENDING  Incomplete   Studies/Results: Dg Chest 2 View  09/20/2015  CLINICAL DATA:  Shortness of breath. Fever and cough since last Thursday. EXAM: CHEST  2 VIEW COMPARISON:  03/30/2014 FINDINGS: Hyperinflation in the lungs suggesting emphysema. No focal airspace disease or consolidation. Central interstitial changes with peribronchial thickening suggesting chronic bronchitis. Calcifications in the upper lungs suggesting calcified granulomas. No blunting of costophrenic angles. No pneumothorax. Normal heart size and pulmonary vascularity. Thoracic scoliosis convex towards the right. Mediastinal contours appear intact. IMPRESSION: Emphysematous changes and chronic bronchitic changes in the lungs. No evidence of active consolidation. Electronically Signed   By: Lucienne Capers M.D.   On: 09/20/2015 01:17   Ct Angio Chest  Pe W/cm &/or Wo Cm  09/20/2015  CLINICAL DATA:  Tachycardia and hypoxia.  Flu like symptoms. EXAM: CT ANGIOGRAPHY CHEST WITH CONTRAST TECHNIQUE: Multidetector CT imaging of the chest was performed using the standard protocol during bolus administration of intravenous contrast. Multiplanar CT image reconstructions and MIPs were obtained to evaluate the vascular anatomy. CONTRAST:  80mL OMNIPAQUE IOHEXOL 350 MG/ML SOLN COMPARISON:  09/20/2015 radiographs FINDINGS: Cardiovascular: There is good opacification of the pulmonary arteries. There is no pulmonary embolism. The thoracic aorta is normal in  caliber and intact. Lungs: Fine interstitial nodularity is scattered throughout both lungs, greatest in the right middle lobe base and left lower lobe posterior base were there is some confluence. This most likely is an infectious or inflammatory process, probably an atypical or viral pneumonia given the clinical history. Central airways: Patent. Mild bronchial wall thickening, particularly in the lower lobes. Effusions: None Lymphadenopathy: None Esophagus: Unremarkable Upper abdomen: Unremarkable Musculoskeletal: No significant abnormality Review of the MIP images confirms the above findings. IMPRESSION: 1. Negative for acute pulmonary embolism. 2. Diffuse nodularity and mild bronchial thickening, suspicious for atypical or viral pneumonia in this clinical setting. Electronically Signed   By: Andreas Newport M.D.   On: 09/20/2015 04:24   Medications: I have reviewed the patient's current medications. Scheduled Meds: . amLODipine  10 mg Oral Daily  . doxycycline (VIBRAMYCIN) IV  100 mg Intravenous Q12H  . heparin  5,000 Units Subcutaneous 3 times per day  . ipratropium-albuterol  3 mL Nebulization Q6H  . lisinopril  10 mg Oral Daily  . potassium chloride  10 mEq Intravenous Q1 Hr x 4  . [START ON 09/21/2015] predniSONE  40 mg Oral Q breakfast  . sodium chloride  3 mL Intravenous Q12H   Continuous Infusions:  PRN Meds:.acetaminophen **OR** acetaminophen, albuterol, ibuprofen Assessment/Plan: Principal Problem:   COPD exacerbation (HCC) Active Problems:   Hypertension   Atypical pneumonia   Hypokalemia   Tachycardia   Prolonged Q-T interval on ECG  COPD Exacerbation: Patient has severe COPD based on pulmonologist reported PFTs, but functionally her symptoms seem mild at baseline. This exacerbation seems to have been incited by a viral or atypical pneumonia based on CXR and CT. Patient had leukocytosis on admission (WBC 13.7). Troponin negative. Lactic acid normal. Influenza panel negative.   Antibiotic therapy to cover atypical organisms and strep pneumo has been initiated.  - Doxycycline 100 mg po q12h - Ceftriaxone IV 2g daily. Switch to oral tomorrow.  - Prednisone 5 mg/ 5 ml solution (40 mg total daily) for 4 days (last dose on 11/6) - Duoneb q6h, Albuterol q2h prn - Urine Legionella pending - HIV antibody pending - one previous reactive Ab test (2013), with quantitative assays negative and Ab assays non-reactive - F/u blood culture  - F/u respiratory culture  - F/u UA and urine culture  - Repeat CBC tomorrow AM - Repeat PFTs as an outpatient - Follow-up with pulmonologist Dr. Chase Caller as outpatient  - SLP evaluation since patient has difficulty swallowing pills. Will give GI referral for outpatient EGD/ barium swallow study.   Hypokalemia: 3.3. Likely due to increased albuterol usage. Magnesium normal.  - KCl 10 mEq, 4 runs. Cannot tolerate PO. - Repeat BMP tomorrow AM  Tachycardia: Has been as high at 160s here, but has generally stayed in the 130s. Visit at the [facility] in September she was at 110. PE has been ruled out. Likely related to her COPD exacerbation. Will continue to monitor for improvement with resolution  of her exacerbation.  Long QT: QTc of 574 on EKG. No previous EKG for comparison. Recently started on mirtazapine (last dose Monday evening) and took DXM for cough (last dose Tuesday). - Repeat EKG tomorrow AM  Hypertension: 140/92 at her last clinic visit and reports systolics in the 023X. BP 156/100 this morning.  - Started on home meds Lisinopril 10 mg qd and Amlodipine 10 mg qd  DVT Prophylaxis: Heparin [place] Diet: Heart Healthy Code Status: Full   Dispo: Disposition is deferred at this time, awaiting improvement of current medical problems.  Anticipated discharge in approximately 1-2 day(s).   The patient does have a current PCP (Juluis Mire, MD) and does need an [facility] hospital follow-up appointment after discharge.  The patient does not have  transportation limitations that hinder transportation to clinic appointments.  .Services Needed at time of discharge: Y = Yes, Blank = No PT:   OT:   RN:   Equipment:   Other:       Shela Leff, MD 09/20/2015, 8:42 AM

## 2015-09-21 ENCOUNTER — Observation Stay (HOSPITAL_COMMUNITY): Payer: No Typology Code available for payment source

## 2015-09-21 DIAGNOSIS — R1310 Dysphagia, unspecified: Secondary | ICD-10-CM | POA: Insufficient documentation

## 2015-09-21 DIAGNOSIS — R0600 Dyspnea, unspecified: Secondary | ICD-10-CM | POA: Insufficient documentation

## 2015-09-21 LAB — BASIC METABOLIC PANEL
Anion gap: 11 (ref 5–15)
BUN: 7 mg/dL (ref 6–20)
CHLORIDE: 103 mmol/L (ref 101–111)
CO2: 26 mmol/L (ref 22–32)
CREATININE: 0.68 mg/dL (ref 0.44–1.00)
Calcium: 9.7 mg/dL (ref 8.9–10.3)
GFR calc non Af Amer: >60 mL/min (ref >60)
Glucose, Bld: 106 mg/dL — ABNORMAL HIGH (ref 65–99)
POTASSIUM: 3.2 mmol/L — AB (ref 3.5–5.1)
SODIUM: 140 mmol/L (ref 135–145)

## 2015-09-21 LAB — CBC WITH DIFFERENTIAL/PLATELET
BASOS ABS: 0.2 10*3/uL — AB (ref 0.0–0.1)
BASOS PCT: 1 %
EOS ABS: 0 10*3/uL (ref 0.0–0.7)
Eosinophils Relative: 0 %
HEMATOCRIT: 42.9 % (ref 36.0–46.0)
HEMOGLOBIN: 14.2 g/dL (ref 12.0–15.0)
LYMPHS PCT: 15 %
Lymphs Abs: 2.3 10*3/uL (ref 0.7–4.0)
MCH: 27.8 pg (ref 26.0–34.0)
MCHC: 33.1 g/dL (ref 30.0–36.0)
MCV: 84.1 fL (ref 78.0–100.0)
MONOS PCT: 7 %
Monocytes Absolute: 1.1 10*3/uL — ABNORMAL HIGH (ref 0.1–1.0)
NEUTROS ABS: 11.7 10*3/uL — AB (ref 1.7–7.7)
NEUTROS PCT: 77 %
Platelets: 332 10*3/uL (ref 150–400)
RBC: 5.1 MIL/uL (ref 3.87–5.11)
RDW: 14 % (ref 11.5–15.5)
WBC: 15.3 10*3/uL — ABNORMAL HIGH (ref 4.0–10.5)

## 2015-09-21 LAB — URINE CULTURE: Special Requests: NORMAL

## 2015-09-21 LAB — LEGIONELLA PNEUMOPHILA SEROGP 1 UR AG: L. pneumophila Serogp 1 Ur Ag: NEGATIVE

## 2015-09-21 MED ORDER — POTASSIUM CHLORIDE CRYS ER 20 MEQ PO TBCR
40.0000 meq | EXTENDED_RELEASE_TABLET | Freq: Once | ORAL | Status: AC
  Administered 2015-09-21: 40 meq via ORAL
  Filled 2015-09-21: qty 2

## 2015-09-21 MED ORDER — SODIUM CHLORIDE 0.9 % IV SOLN
INTRAVENOUS | Status: AC
  Administered 2015-09-21: 11:00:00 via INTRAVENOUS

## 2015-09-21 MED ORDER — DM-GUAIFENESIN ER 30-600 MG PO TB12
1.0000 | ORAL_TABLET | Freq: Two times a day (BID) | ORAL | Status: DC | PRN
  Filled 2015-09-21: qty 1

## 2015-09-21 MED ORDER — IPRATROPIUM-ALBUTEROL 0.5-2.5 (3) MG/3ML IN SOLN
3.0000 mL | RESPIRATORY_TRACT | Status: DC
  Administered 2015-09-21 (×4): 3 mL via RESPIRATORY_TRACT
  Filled 2015-09-21 (×4): qty 3

## 2015-09-21 NOTE — Progress Notes (Signed)
Subjective: Patient was seen and examined at bedside this AM. As per nursing staff, her O2 saturation dropped to 83% last night when patient removed her nasal cannula. She is currently on 2L O2 via [place]. Patient states her breathing is better when using the nasal cannula. She is complaining of a cough productive of thick yellow sputum and reports having pleuritic chest pain. No other complaints.   Objective: Vital signs in last 24 hours: Filed Vitals:   09/21/15 0801 09/21/15 1051 09/21/15 1121 09/21/15 1155  BP:  162/104 161/110   Pulse:  137 130   Temp:   98.7 F (37.1 C)   TempSrc:   Oral   Resp:      Height:      Weight:      SpO2: 92%  88% 93%   Weight change: 12.8 oz (0.363 kg)  Intake/Output Summary (Last 24 hours) at 09/21/15 1438 Last data filed at 09/21/15 1215  Gross per 24 hour  Intake    900 ml  Output   1350 ml  Net   -450 ml    Physical exam  General: Lying in bed, no acute distress HEENT: Moist mucous membranes, mild oropharyngeal erythema with no exudates PERRL, EOMI Cardiology: Tachycardic with regular rhythm. No m/r/g Pulmonary: Decreased breath sounds in bilateral lung bases. No crackles appreciated. Unlabored breathing. Patient on 2L O2 via [place].  Abdomen: Soft, non-tender, non-distended. Normal bowel sounds. Skin: Warm and dry. Extremities: No clubbing, cyanosis, or edema. No calf tenderness. Normal distal pulses.   Lab Results: Basic Metabolic Panel:  Recent Labs Lab 09/20/15 0115 09/20/15 0708 09/21/15 0715  NA 139  --  140  K 3.3*  --  3.2*  CL 99*  --  103  CO2 24  --  26  GLUCOSE 122*  --  106*  BUN 11  --  7  CREATININE 0.67  --  0.68  CALCIUM 9.8  --  9.7  MG  --  1.9  --    Liver Function Tests:  Recent Labs Lab 09/20/15 0115  AST 18  ALT 11*  ALKPHOS 94  BILITOT 0.8  PROT 8.0  ALBUMIN 3.5   CBC:  Recent Labs Lab 09/20/15 0115 09/21/15 0715  WBC 13.7* 15.3*  NEUTROABS  --  11.7*  HGB 15.0 14.2  HCT 45.1 42.9   MCV 83.4 84.1  PLT 314 332    Micro Results: Recent Results (from the past 240 hour(s))  Culture, blood (routine x 2)     Status: None (Preliminary result)   Collection Time: 09/20/15  5:03 AM  Result Value Ref Range Status   Specimen Description BLOOD LEFT ANTECUBITAL  Final   Special Requests BOTTLES DRAWN AEROBIC AND ANAEROBIC 5CC  Final   Culture NO GROWTH 1 DAY  Final   Report Status PENDING  Incomplete  Culture, expectorated sputum-assessment     Status: None   Collection Time: 09/20/15  5:19 AM  Result Value Ref Range Status   Specimen Description SPUTUM  Final   Special Requests Normal  Final   Sputum evaluation   Final    THIS SPECIMEN IS ACCEPTABLE. RESPIRATORY CULTURE REPORT TO FOLLOW.   Report Status 09/20/2015 FINAL  Final  Culture, respiratory (NON-Expectorated)     Status: None (Preliminary result)   Collection Time: 09/20/15  5:19 AM  Result Value Ref Range Status   Specimen Description EXPECTORATED SPUTUM  Final   Special Requests NONE  Final   Gram Stain  Final    ABUNDANT WBC PRESENT, PREDOMINANTLY PMN NO SQUAMOUS EPITHELIAL CELLS SEEN FEW GRAM POSITIVE COCCI IN PAIRS Performed at [facility]    Culture PENDING  Incomplete   Report Status PENDING  Incomplete  Culture, blood (routine x 2)     Status: None (Preliminary result)   Collection Time: 09/20/15  5:31 AM  Result Value Ref Range Status   Specimen Description BLOOD RIGHT FOREARM  Final   Special Requests BOTTLES DRAWN AEROBIC AND ANAEROBIC 5CC  Final   Culture NO GROWTH 1 DAY  Final   Report Status PENDING  Incomplete  Urine culture     Status: None (Preliminary result)   Collection Time: 09/20/15  1:46 PM  Result Value Ref Range Status   Specimen Description URINE, CLEAN CATCH  Final   Special Requests Normal  Final   Culture NO GROWTH < 24 HOURS  Final   Report Status PENDING  Incomplete   Studies/Results: Dg Chest 2 View  09/21/2015  CLINICAL DATA:  Cough and congestion.  Shortness of breath for several days. EXAM: CHEST  2 VIEW COMPARISON:  09/21/2015 FINDINGS: Normal heart size. Bilateral lower lobe peribronchial opacities are identified suspicious for bronchopneumonia. There is also bilateral lower lobe bronchial wall thickening and possibly bronchiectasis. The lungs are hyperinflated. No pleural effusion or edema. IMPRESSION: 1. Suspect bilateral lower lobe bronchopneumonia. Electronically Signed   By: Kerby Moors M.D.   On: 09/21/2015 09:24   Dg Chest 2 View  09/20/2015  CLINICAL DATA:  Shortness of breath. Fever and cough since last Thursday. EXAM: CHEST  2 VIEW COMPARISON:  03/30/2014 FINDINGS: Hyperinflation in the lungs suggesting emphysema. No focal airspace disease or consolidation. Central interstitial changes with peribronchial thickening suggesting chronic bronchitis. Calcifications in the upper lungs suggesting calcified granulomas. No blunting of costophrenic angles. No pneumothorax. Normal heart size and pulmonary vascularity. Thoracic scoliosis convex towards the right. Mediastinal contours appear intact. IMPRESSION: Emphysematous changes and chronic bronchitic changes in the lungs. No evidence of active consolidation. Electronically Signed   By: Lucienne Capers M.D.   On: 09/20/2015 01:17   Ct Angio Chest Pe W/cm &/or Wo Cm  09/20/2015  CLINICAL DATA:  Tachycardia and hypoxia.  Flu like symptoms. EXAM: CT ANGIOGRAPHY CHEST WITH CONTRAST TECHNIQUE: Multidetector CT imaging of the chest was performed using the standard protocol during bolus administration of intravenous contrast. Multiplanar CT image reconstructions and MIPs were obtained to evaluate the vascular anatomy. CONTRAST:  45mL OMNIPAQUE IOHEXOL 350 MG/ML SOLN COMPARISON:  09/20/2015 radiographs FINDINGS: Cardiovascular: There is good opacification of the pulmonary arteries. There is no pulmonary embolism. The thoracic aorta is normal in caliber and intact. Lungs: Fine interstitial nodularity is  scattered throughout both lungs, greatest in the right middle lobe base and left lower lobe posterior base were there is some confluence. This most likely is an infectious or inflammatory process, probably an atypical or viral pneumonia given the clinical history. Central airways: Patent. Mild bronchial wall thickening, particularly in the lower lobes. Effusions: None Lymphadenopathy: None Esophagus: Unremarkable Upper abdomen: Unremarkable Musculoskeletal: No significant abnormality Review of the MIP images confirms the above findings. IMPRESSION: 1. Negative for acute pulmonary embolism. 2. Diffuse nodularity and mild bronchial thickening, suspicious for atypical or viral pneumonia in this clinical setting. Electronically Signed   By: Andreas Newport M.D.   On: 09/20/2015 04:24   Dg Esophagus  09/20/2015  CLINICAL DATA:  Difficulty swallowing pills. EXAM: ESOPHOGRAM/BARIUM SWALLOW TECHNIQUE: Single contrast examination was performed using  barium. FLUOROSCOPY TIME:  Radiation Exposure Index (as provided by the fluoroscopic device): If the device does not provide the exposure index: Fluoroscopy Time:  0 minutes and 42 seconds. Number of Acquired Images: COMPARISON:  None. FINDINGS: Study was limited by the patient's inability to swallow effervescent crystals. Patient also would not swallow a pill as a pill getting caught in her throat was the reason for her presentation to the emergency room. The patient was unable to lie flat. AP and lateral views of the hypopharynx while swallowing are unremarkable although somewhat limited by small volume contrast taken with each swallow. Oblique views of the esophagus show a short segment of apparent luminal stricturing in the proximal 1/3 of the esophagus, at about the level of the transverse aorta. Despite repeated swallows, this area of esophagus did not fully distend. Shouldering along this narrowing is relatively smooth with no substantial mucosal irregularity.  IMPRESSION: Very limited study as outlined above. There does appear to be a short segment of gradual luminal narrowing in the upper esophagus. Imaging features would be compatible with stricture and imaging features suggest a benign etiology although neoplastic involvement cannot be excluded. Consider upper endoscopy to further evaluate. Electronically Signed   By: Misty Stanley M.D.   On: 09/20/2015 17:01   Medications: I have reviewed the patient's current medications. Scheduled Meds: . amLODipine  10 mg Oral Daily  . cefTRIAXone (ROCEPHIN)  IV  2 g Intravenous Q24H  . doxycycline  100 mg Oral Q12H  . heparin  5,000 Units Subcutaneous 3 times per day  . ipratropium-albuterol  3 mL Nebulization Q4H  . lisinopril  10 mg Oral Daily  . predniSONE  40 mg Oral Q breakfast  . sodium chloride  3 mL Intravenous Q12H   Continuous Infusions: . sodium chloride 100 mL/hr at 09/21/15 1056   PRN Meds:.acetaminophen **OR** acetaminophen, albuterol, dextromethorphan-guaiFENesin, ibuprofen Assessment/Plan: Principal Problem:   COPD exacerbation (HCC) Active Problems:   Hypertension   Atypical pneumonia   Hypokalemia   Tachycardia   Prolonged Q-T interval on ECG   Dysphagia   Dyspnea  COPD Exacerbation: Patient has severe COPD based on pulmonologist reported PFTs, but functionally her symptoms seem mild at baseline. This exacerbation seems to have been incited by a viral or atypical pneumonia based on CXR and CT. Patient had leukocytosis on admission (WBC 13.7). Troponin negative. Lactic acid normal. Influenza panel negative.  Antibiotic therapy to cover atypical organisms and strep pneumo has been initiated. As per nursing staff, her O2 saturation dropped to 83% last night when patient removed her nasal cannula. She is currently on 2L O2 via [place]. Patient states her breathing is better when using the nasal cannula. She is complaining of a cough productive of thick yellow sputum and reports having  pleuritic chest pain. Patient still continues to be tachycardic (HR 140s). Physical exam showing decreased breath sounds in bilateral lung bases. CXR this AM showing bilateral lower lobe bronchopneumonia. Blood culture showing no growth x 1 day. Respiratory culture showing abundant WBCs and a few gram + cocci in pairs. Urine culture showing no growth in <24 hrs. HIV antibody negative.  - Doxycycline 100 mg po q12h - Ceftriaxone IV 2g daily. Switch to oral Ceftin tomorrow.  - Prednisone 5 mg/ 5 ml solution (40 mg total daily) for 4 days (last dose on 11/6) - Duoneb q4h, Albuterol q4h - Mucinex DM - Urine Legionella pending - F/u final blood culture results  - F/u final respiratory culture results  -  F/u UA final urine culture results  - Repeat CBC tomorrow AM - Repeat PFTs as an outpatient - Follow-up with pulmonologist Dr. Chase Caller as outpatient    Difficulty swallowing pills  Patient states she has always had difficulty swallowing pills. States she has no difficulty swallowing food because she always chews it well. Denies having any difficulty swallowing liquids. Denies having odynphagia. Barium swallow showing a short segment of gradual luminal narrowing in the upper esophagus compatible with a stricture. -Will arrange for outpatient EGD with GI.  Hypokalemia: 3.2 today. Likely due to increased albuterol usage.  - KCl 40 mEq  - Repeat BMP tomorrow AM  Tachycardia: HR in the 140s this AM. Likely related to her COPD exacerbation and pneumonia. Will continue to monitor for improvement.   Long QT: QTc of 574 on EKG. No previous EKG for comparison. Recently started on mirtazapine (last dose Monday evening) and took DXM for cough (last dose Tuesday). QTc 431 on EKG today.  -Continue to monitor.   Hypertension: 140/92 at her last clinic visit and reports systolics in the 358I. BP 116/83 this morning.  - Continue Lisinopril 10 mg qd and Amlodipine 10 mg qd  DVT Prophylaxis: Heparin  [place] Diet: Heart Healthy Code Status: Full   Dispo: Disposition is deferred at this time, awaiting improvement of current medical problems.  Anticipated discharge in approximately 1-2 day(s).   The patient does have a current PCP (Juluis Mire, MD) and does need an [facility] hospital follow-up appointment after discharge.  The patient does not have transportation limitations that hinder transportation to clinic appointments.  .Services Needed at time of discharge: Y = Yes, Blank = No PT:   OT:   RN:   Equipment:   Other:       Shela Leff, MD 09/21/2015, 2:38 PM

## 2015-09-21 NOTE — Progress Notes (Signed)
Speech Language Pathology Treatment: Dysphagia  Patient Details Name: Cynthia Martinez MRN: [id number] DOB: 1961/01/08 Today's Date: 09/21/2015 Time: 1975-8832 SLP Time Calculation (min) (ACUTE ONLY): 15 min  Assessment / Plan / Recommendation Clinical Impression  Pt reports no further episodes of POs getting "stuck" and utilizes esophageal and aspiration precautions with Mod I. Note possible narrowing of her esophagus per barium swallow. Recommend to continue with current diet and precautions. No further acute SLP needs identified.   HPI Other Pertinent Information: 54 yo female with PMH of COPD, treated HCV, and chronic cough who presents with SOB. CXR without clear consolidation, although CT Angio suggestive of an atypical or viral PNA. Pt had a choking episode in the ED when trying to swallow her pill. Upon chart review, note that pt has been describing difficulty with swallowing pills specifically for over the past year.   Pertinent Vitals Pain Assessment: No/denies pain  SLP Plan  All goals met    Recommendations Diet recommendations: Regular;Thin liquid Liquids provided via: Cup;Straw Medication Administration: Crushed with puree Supervision: Patient able to self feed;Intermittent supervision to cue for compensatory strategies Compensations: Slow rate;Small sips/bites;Follow solids with liquid Postural Changes and/or Swallow Maneuvers: Seated upright 90 degrees;Upright 30-60 min after meal       Oral Care Recommendations: Oral care BID Follow up Recommendations: None Plan: All goals met    Germain Osgood, M.A. CCC-SLP [phone]  Germain Osgood 09/21/2015, 3:17 PM

## 2015-09-21 NOTE — Progress Notes (Signed)
RT enterd pts room and sat 83%. Pt had removed O2 to blow nose and had a coughing spell. Placed O2 back on pt A 4l to obtain sat of 92.RN notified

## 2015-09-22 DIAGNOSIS — R1319 Other dysphagia: Secondary | ICD-10-CM

## 2015-09-22 LAB — CBC
HCT: 36.5 % (ref 36.0–46.0)
Hemoglobin: 11.8 g/dL — ABNORMAL LOW (ref 12.0–15.0)
MCH: 28 pg (ref 26.0–34.0)
MCHC: 32.3 g/dL (ref 30.0–36.0)
MCV: 86.7 fL (ref 78.0–100.0)
Platelets: 313 10*3/uL (ref 150–400)
RBC: 4.21 MIL/uL (ref 3.87–5.11)
RDW: 14.4 % (ref 11.5–15.5)
WBC: 23.1 10*3/uL — ABNORMAL HIGH (ref 4.0–10.5)

## 2015-09-22 LAB — BASIC METABOLIC PANEL
Anion gap: 9 (ref 5–15)
BUN: 9 mg/dL (ref 6–20)
CHLORIDE: 105 mmol/L (ref 101–111)
CO2: 27 mmol/L (ref 22–32)
CREATININE: 0.62 mg/dL (ref 0.44–1.00)
Calcium: 9.1 mg/dL (ref 8.9–10.3)
GFR calc Af Amer: >60 mL/min (ref >60)
GFR calc non Af Amer: >60 mL/min (ref >60)
GLUCOSE: 95 mg/dL (ref 65–99)
Potassium: 3.4 mmol/L — ABNORMAL LOW (ref 3.5–5.1)
SODIUM: 141 mmol/L (ref 135–145)

## 2015-09-22 MED ORDER — DOXYCYCLINE HYCLATE 100 MG PO TABS
100.0000 mg | ORAL_TABLET | Freq: Two times a day (BID) | ORAL | Status: DC
  Administered 2015-09-22 – 2015-09-23 (×3): 100 mg via ORAL
  Filled 2015-09-22 (×3): qty 1

## 2015-09-22 MED ORDER — POTASSIUM CHLORIDE CRYS ER 20 MEQ PO TBCR
40.0000 meq | EXTENDED_RELEASE_TABLET | Freq: Once | ORAL | Status: AC
  Administered 2015-09-22: 40 meq via ORAL
  Filled 2015-09-22: qty 2

## 2015-09-22 MED ORDER — IPRATROPIUM-ALBUTEROL 0.5-2.5 (3) MG/3ML IN SOLN
3.0000 mL | Freq: Three times a day (TID) | RESPIRATORY_TRACT | Status: DC
  Administered 2015-09-22 – 2015-09-24 (×7): 3 mL via RESPIRATORY_TRACT
  Filled 2015-09-22 (×7): qty 3

## 2015-09-22 NOTE — Progress Notes (Addendum)
Subjective: Patient was seen and examined at bedside this AM. As per nursing staff, her O2 saturation dropped to 89% (at rest) and 87% (when ambulating) on room air. Patient satting 99% at rest on 2L via [place]. Patient states her breathing is better today and cough/ pleuritic CP have improved. She is tolerating po intake well and having regular BMs. No other complaints.   Objective: Vital signs in last 24 hours: Filed Vitals:   09/21/15 2309 09/22/15 0542 09/22/15 0930 09/22/15 1156  BP:  115/87 148/86 131/77  Pulse:  100 120 112  Temp:  98.4 F (36.9 C) 98.8 F (37.1 C) 98.2 F (36.8 C)  TempSrc:  Oral Oral Oral  Resp:  18 19 18   Height:      Weight:  99 lb 3.2 oz (44.997 kg)    SpO2: 96% 100% 97% 99%   Weight change: 4 lb 1.6 oz (1.86 kg)  Intake/Output Summary (Last 24 hours) at 09/22/15 1306 Last data filed at 09/22/15 1100  Gross per 24 hour  Intake 1888.33 ml  Output    950 ml  Net 938.33 ml    Physical exam  General: Lying in bed, no acute distress HEENT: Moist mucous membranes, mild oropharyngeal erythema with no exudates PERRL, EOMI Cardiology: Tachycardic with regular rhythm. No m/r/g Pulmonary: Scattered wheezes. Lungs sound much better in comparison to yesterday. No crackles appreciated. Improved breath sounds at the lung bases. Unlabored breathing. Patient satting 99% at rest on 2L O2 via [place].  Abdomen: Soft, non-tender, non-distended. Normal bowel sounds. Skin: Warm and dry. Extremities: No clubbing, cyanosis, or edema. Intact distal pulses.   Lab Results: Basic Metabolic Panel:  Recent Labs Lab 09/20/15 0708 09/21/15 0715 09/22/15 0249  NA  --  140 141  K  --  3.2* 3.4*  CL  --  103 105  CO2  --  26 27  GLUCOSE  --  106* 95  BUN  --  7 9  CREATININE  --  0.68 0.62  CALCIUM  --  9.7 9.1  MG 1.9  --   --    Liver Function Tests:  Recent Labs Lab 09/20/15 0115  AST 18  ALT 11*  ALKPHOS 94  BILITOT 0.8  PROT 8.0  ALBUMIN 3.5    CBC:  Recent Labs Lab 09/21/15 0715 09/22/15 0249  WBC 15.3* 23.1*  NEUTROABS 11.7*  --   HGB 14.2 11.8*  HCT 42.9 36.5  MCV 84.1 86.7  PLT 332 313    Micro Results: Recent Results (from the past 240 hour(s))  Culture, blood (routine x 2)     Status: None (Preliminary result)   Collection Time: 09/20/15  5:03 AM  Result Value Ref Range Status   Specimen Description BLOOD LEFT ANTECUBITAL  Final   Special Requests BOTTLES DRAWN AEROBIC AND ANAEROBIC 5CC  Final   Culture NO GROWTH 1 DAY  Final   Report Status PENDING  Incomplete  Culture, expectorated sputum-assessment     Status: None   Collection Time: 09/20/15  5:19 AM  Result Value Ref Range Status   Specimen Description SPUTUM  Final   Special Requests Normal  Final   Sputum evaluation   Final    THIS SPECIMEN IS ACCEPTABLE. RESPIRATORY CULTURE REPORT TO FOLLOW.   Report Status 09/20/2015 FINAL  Final  Culture, respiratory (NON-Expectorated)     Status: None (Preliminary result)   Collection Time: 09/20/15  5:19 AM  Result Value Ref Range Status   Specimen Description  EXPECTORATED SPUTUM  Final   Special Requests NONE  Final   Gram Stain   Final    ABUNDANT WBC PRESENT, PREDOMINANTLY PMN NO SQUAMOUS EPITHELIAL CELLS SEEN FEW GRAM POSITIVE COCCI IN PAIRS Performed at [facility]    Culture   Final    Culture reincubated for better growth Performed at [facility]    Report Status PENDING  Incomplete  Culture, blood (routine x 2)     Status: None (Preliminary result)   Collection Time: 09/20/15  5:31 AM  Result Value Ref Range Status   Specimen Description BLOOD RIGHT FOREARM  Final   Special Requests BOTTLES DRAWN AEROBIC AND ANAEROBIC 5CC  Final   Culture NO GROWTH 1 DAY  Final   Report Status PENDING  Incomplete  Urine culture     Status: None   Collection Time: 09/20/15  1:46 PM  Result Value Ref Range Status   Specimen Description URINE, CLEAN CATCH  Final   Special Requests Normal   Final   Culture MULTIPLE SPECIES PRESENT, SUGGEST RECOLLECTION  Final   Report Status 09/21/2015 FINAL  Final   Studies/Results: Dg Chest 2 View  09/21/2015  CLINICAL DATA:  Cough and congestion. Shortness of breath for several days. EXAM: CHEST  2 VIEW COMPARISON:  09/21/2015 FINDINGS: Normal heart size. Bilateral lower lobe peribronchial opacities are identified suspicious for bronchopneumonia. There is also bilateral lower lobe bronchial wall thickening and possibly bronchiectasis. The lungs are hyperinflated. No pleural effusion or edema. IMPRESSION: 1. Suspect bilateral lower lobe bronchopneumonia. Electronically Signed   By: Kerby Moors M.D.   On: 09/21/2015 09:24   Dg Esophagus  09/20/2015  CLINICAL DATA:  Difficulty swallowing pills. EXAM: ESOPHOGRAM/BARIUM SWALLOW TECHNIQUE: Single contrast examination was performed using  barium. FLUOROSCOPY TIME:  Radiation Exposure Index (as provided by the fluoroscopic device): If the device does not provide the exposure index: Fluoroscopy Time:  0 minutes and 42 seconds. Number of Acquired Images: COMPARISON:  None. FINDINGS: Study was limited by the patient's inability to swallow effervescent crystals. Patient also would not swallow a pill as a pill getting caught in her throat was the reason for her presentation to the emergency room. The patient was unable to lie flat. AP and lateral views of the hypopharynx while swallowing are unremarkable although somewhat limited by small volume contrast taken with each swallow. Oblique views of the esophagus show a short segment of apparent luminal stricturing in the proximal 1/3 of the esophagus, at about the level of the transverse aorta. Despite repeated swallows, this area of esophagus did not fully distend. Shouldering along this narrowing is relatively smooth with no substantial mucosal irregularity. IMPRESSION: Very limited study as outlined above. There does appear to be a short segment of gradual luminal  narrowing in the upper esophagus. Imaging features would be compatible with stricture and imaging features suggest a benign etiology although neoplastic involvement cannot be excluded. Consider upper endoscopy to further evaluate. Electronically Signed   By: Misty Stanley M.D.   On: 09/20/2015 17:01   Medications: I have reviewed the patient's current medications. Scheduled Meds: . amLODipine  10 mg Oral Daily  . cefTRIAXone (ROCEPHIN)  IV  2 g Intravenous Q24H  . heparin  5,000 Units Subcutaneous 3 times per day  . ipratropium-albuterol  3 mL Nebulization TID  . lisinopril  10 mg Oral Daily  . predniSONE  40 mg Oral Q breakfast  . sodium chloride  3 mL Intravenous Q12H   Continuous Infusions:  PRN Meds:.acetaminophen **OR** acetaminophen, albuterol, dextromethorphan-guaiFENesin, ibuprofen Assessment/Plan: Principal Problem:   COPD exacerbation (HCC) Active Problems:   Hypertension   Atypical pneumonia   Hypokalemia   Tachycardia   Prolonged Q-T interval on ECG   Dysphagia   Dyspnea  COPD Exacerbation and CAP: As per nursing staff, her O2 saturation dropped to 89% (at rest) and 87% (when ambulating) on room air. Patient satting 99% at rest on 2L via [place]. Patient states her breathing is better today and cough/ pleuritic CP have improved. HR in the 120s which is close to her baseline since she is chronically tachycardic. Lungs sound much better on ausculation - scattered wheezing, no crackles, improved breath sounds at the bases. Blood culture showing no growth x 1 day. Respiratory culture showing abundant WBCs and a few gram + cocci in pairs. Urine legionella negative.  - Doxycycline 100 mg po q12h - Ceftriaxone IV 2g daily  - Prednisone solution (40 mg total daily) - Duoneb q4h, Albuterol q4h - Mucinex DM - F/u final blood culture results  - F/u final respiratory culture results  - Repeat CBC tomorrow AM - Repeat PFTs as an outpatient - Follow-up with pulmonologist Dr. Chase Caller  as outpatient   Difficulty swallowing pills  Patient states she has always had difficulty swallowing pills. States she has no difficulty swallowing food because she always chews it well. Denies having any difficulty swallowing liquids. Denies having odynphagia. Barium swallow showing a short segment of gradual luminal narrowing in the upper esophagus compatible with a stricture. -Will arrange for outpatient EGD with GI.  Hypokalemia: 3.4 today. Likely due to increased albuterol usage.  - KCl 40 mEq  - Repeat BMP tomorrow AM  Tachycardia: HR in the 120s this AM, which is close to her baseline (patient seems to be chronically tachycardic). Likely related to her COPD exacerbation and pneumonia. Will continue to monitor for improvement.   Long QT: QTc of 431 on EKG.  -Continue to monitor. Avoid QT prolonging drugs.   Hypertension: BP 131/77 this morning.  - Continue Lisinopril 10 mg qd and Amlodipine 10 mg qd  DVT Prophylaxis: Heparin [place]  Diet: Heart Healthy  Code Status: Full   Addendum: Hgb 11.8 today, was 14.2 yesterday. Likely due to dilutional effect. Patient received NS@ 100cc/hr yesterday.   Dispo: Disposition is deferred at this time, awaiting improvement of current medical problems.  Anticipated discharge in approximately 1-2 day(s).   The patient does have a current PCP (Juluis Mire, MD) and does need an [facility] hospital follow-up appointment after discharge.  The patient does not have transportation limitations that hinder transportation to clinic appointments.  .Services Needed at time of discharge: Y = Yes, Blank = No PT:   OT:   RN:   Equipment:   Other:     LOS: 1 day   Shela Leff, MD 09/22/2015, 1:06 PM

## 2015-09-22 NOTE — Progress Notes (Signed)
SATURATION QUALIFICATIONS: (This note is used to comply with regulatory documentation for home oxygen)  Patient Saturations on Room Air at Rest = 89%  Patient Saturations on Room Air while Ambulating = 87%  Patient Saturations on 2 Liters of oxygen while Ambulating = 91%  Please briefly explain why patient needs home oxygen:  Patient's oxygen saturations decreased to 87% while ambulating. 2L via nasal cannula placed and saturations increased to 91%.

## 2015-09-23 DIAGNOSIS — J14 Pneumonia due to Hemophilus influenzae: Secondary | ICD-10-CM | POA: Diagnosis present

## 2015-09-23 HISTORY — DX: Pneumonia due to hemophilus influenzae: J14

## 2015-09-23 LAB — CULTURE, RESPIRATORY W GRAM STAIN

## 2015-09-23 LAB — BASIC METABOLIC PANEL
ANION GAP: 10 (ref 5–15)
BUN: 6 mg/dL (ref 6–20)
CHLORIDE: 102 mmol/L (ref 101–111)
CO2: 28 mmol/L (ref 22–32)
Calcium: 9.4 mg/dL (ref 8.9–10.3)
Creatinine, Ser: 0.62 mg/dL (ref 0.44–1.00)
GFR calc non Af Amer: >60 mL/min (ref >60)
GLUCOSE: 89 mg/dL (ref 65–99)
POTASSIUM: 3.2 mmol/L — AB (ref 3.5–5.1)
Sodium: 140 mmol/L (ref 135–145)

## 2015-09-23 LAB — CBC
HCT: 38.8 % (ref 36.0–46.0)
Hemoglobin: 12.2 g/dL (ref 12.0–15.0)
MCH: 27 pg (ref 26.0–34.0)
MCHC: 31.4 g/dL (ref 30.0–36.0)
MCV: 85.8 fL (ref 78.0–100.0)
Platelets: 338 10*3/uL (ref 150–400)
RBC: 4.52 MIL/uL (ref 3.87–5.11)
RDW: 14.3 % (ref 11.5–15.5)
WBC: 15.5 10*3/uL — ABNORMAL HIGH (ref 4.0–10.5)

## 2015-09-23 LAB — CULTURE, RESPIRATORY

## 2015-09-23 MED ORDER — CEFUROXIME AXETIL 250 MG/5ML PO SUSR: 500.0000 mg | Freq: Two times a day (BID) | ORAL | Status: AC

## 2015-09-23 MED ORDER — DOXYCYCLINE HYCLATE 100 MG PO TABS
100.0000 mg | ORAL_TABLET | Freq: Two times a day (BID) | ORAL | Status: DC
  Administered 2015-09-23 – 2015-09-24 (×2): 100 mg via ORAL
  Filled 2015-09-23 (×2): qty 1

## 2015-09-23 MED ORDER — DEXTROSE 5 % IV SOLN
2.0000 g | INTRAVENOUS | Status: DC
  Administered 2015-09-24: 2 g via INTRAVENOUS
  Filled 2015-09-23: qty 2

## 2015-09-23 MED ORDER — PREDNISONE 5 MG/ML PO CONC: 40.0000 mg | Freq: Every day | ORAL | Status: DC

## 2015-09-23 MED ORDER — DOXYCYCLINE HYCLATE 100 MG PO TABS: 100.0000 mg | ORAL_TABLET | Freq: Two times a day (BID) | ORAL | Status: DC

## 2015-09-23 MED ORDER — DM-GUAIFENESIN ER 30-600 MG PO TB12: 1.0000 | ORAL_TABLET | Freq: Two times a day (BID) | ORAL | Status: DC | PRN

## 2015-09-23 MED ORDER — CEFUROXIME AXETIL 250 MG/5ML PO SUSR: 500.0000 mg | Freq: Two times a day (BID) | ORAL | Status: DC

## 2015-09-23 MED ORDER — POTASSIUM CHLORIDE CRYS ER 20 MEQ PO TBCR
40.0000 meq | EXTENDED_RELEASE_TABLET | Freq: Once | ORAL | Status: AC
  Administered 2015-09-23: 40 meq via ORAL
  Filled 2015-09-23: qty 2

## 2015-09-23 MED ORDER — ALBUTEROL SULFATE HFA 108 (90 BASE) MCG/ACT IN AERS: 2.0000 | INHALATION_SPRAY | RESPIRATORY_TRACT | Status: DC | PRN

## 2015-09-23 MED ORDER — POTASSIUM CHLORIDE 20 MEQ PO PACK
40.0000 meq | PACK | Freq: Once | ORAL | Status: DC
  Filled 2015-09-23: qty 2

## 2015-09-23 NOTE — Progress Notes (Signed)
Pt ambulated in the hallway 80 ft on RA SPO2 85% recovered on 2L 95%. Pt c/o SOB also tachycardic HR 130.

## 2015-09-23 NOTE — Care Management Note (Addendum)
Case Management Note  Patient Details  Name: Cynthia Martinez MRN: [id number] Date of Birth: Sep 12, 1961  Subjective/Objective:                  54 y/o female with progressive SOB likely secondary to underlying PNA and mild COPD exacerbation.  Action/Plan: CM spoke to patient at the bedside along with her fiance. Patient in need of home oxygen therapy. CM offered patient choice of [place] agency and Patient chose [facility] as patient will need charity as medicaid is pending. CM called Tiffany with [facility] to request home oxygen therapy. Awaiting callback. Patient said that her fiance will be at home with her to support and declined the need for any [facility] support or services. Patient declined any DME needs. CM spoke to patient about medications and she said that she gets her inhaler filled for free at the [facility]. Patient sees Juluis Mire, MD for PCP. Patient uses her orange card for her BP medication but is not able to use it for all medications and even in that case, not able to use it on the weekend. CM explained [place] letter to patient and fiance and provided [place] letter to patient. CM explained where to get medications filled.   Expected Discharge Date:  09/23/15              Expected Discharge Plan:  [place]  In-House Referral:     Discharge planning Services  CM Consult, medication assistance and MATCH  Post Acute Care Choice:  Home Health Choice offered to:  Patient  DME Arranged:  Oxygen DME Agency:  [place] Arranged:  NA [place] Agency:  NA  Status of Service:  In process, will continue to follow  Medicare Important Message Given:    Date Medicare IM Given:    Medicare IM give by:    Date Additional Medicare IM Given:    Additional Medicare Important Message give by:     If discussed at [place] of Stay Meetings, dates discussed:    Additional Comments:  Guido Sander, RN 09/23/2015, 3:52 PM

## 2015-09-23 NOTE — Discharge Instructions (Addendum)
You were admitted with Pneumonia and COPD exacerbation. You were positive for H. Influenza.  You need to take ceftin antibiotic for 2 more days to complete 7 day course.  You need to also take prednisone for 2 more days as well.  You will need to follow up with your lung doctor Dr. Chase Caller and also with at our clinic. You may benefit from repeat pulmonary function test with your lung doctor.  We will continue oxygen for you until you get better.

## 2015-09-23 NOTE — Progress Notes (Signed)
Subjective: Patient is doing fine, has some SOB, no fever,chills,n/v. Has some cough. Feels better today. Remains on 2L oxygen. Asked RN to walk her and monitor oxygen sat. Has albuterol at home.   Objective: Vital signs in last 24 hours: Filed Vitals:   09/22/15 1340 09/22/15 2004 09/22/15 2052 09/23/15 0540  BP:   125/72 138/86  Pulse:   108 95  Temp:   98.5 F (36.9 C) 98.6 F (37 C)  TempSrc:   Oral Oral  Resp:   18 20  Height:      Weight:    96 lb 1.6 oz (43.591 kg)  SpO2: 99% 98% 95% 97%   Weight change: -3 lb 1.6 oz (-1.406 kg)  Intake/Output Summary (Last 24 hours) at 09/23/15 0948 Last data filed at 09/23/15 0841  Gross per 24 hour  Intake   1010 ml  Output   1850 ml  Net   -840 ml    Physical exam  General: sitting in bed, NAD HEENT: [place], PERRL Cardiology: Tachycardic with regular rhythm. No m/r/g Pulmonary: Scattered mild wheezes. Good air movement.  satting 99% at rest on 2L O2 via [place].  Abdomen: Soft, non-tender, non-distended. Normal bowel sounds. Skin: Warm and dry. Extremities: No clubbing, cyanosis, or edema. Intact distal pulses.   Lab Results: Basic Metabolic Panel:  Recent Labs Lab 09/20/15 0708  09/22/15 0249 09/23/15 0358  NA  --   < > 141 140  K  --   < > 3.4* 3.2*  CL  --   < > 105 102  CO2  --   < > 27 28  GLUCOSE  --   < > 95 89  BUN  --   < > 9 6  CREATININE  --   < > 0.62 0.62  CALCIUM  --   < > 9.1 9.4  MG 1.9  --   --   --   < > = values in this interval not displayed. Liver Function Tests:  Recent Labs Lab 09/20/15 0115  AST 18  ALT 11*  ALKPHOS 94  BILITOT 0.8  PROT 8.0  ALBUMIN 3.5   CBC:  Recent Labs Lab 09/21/15 0715 09/22/15 0249 09/23/15 0358  WBC 15.3* 23.1* 15.5*  NEUTROABS 11.7*  --   --   HGB 14.2 11.8* 12.2  HCT 42.9 36.5 38.8  MCV 84.1 86.7 85.8  PLT 332 313 338    Micro Results: Recent Results (from the past 240 hour(s))  Culture, blood (routine x 2)     Status: None (Preliminary  result)   Collection Time: 09/20/15  5:03 AM  Result Value Ref Range Status   Specimen Description BLOOD LEFT ANTECUBITAL  Final   Special Requests BOTTLES DRAWN AEROBIC AND ANAEROBIC 5CC  Final   Culture NO GROWTH 3 DAYS  Final   Report Status PENDING  Incomplete  Culture, expectorated sputum-assessment     Status: None   Collection Time: 09/20/15  5:19 AM  Result Value Ref Range Status   Specimen Description SPUTUM  Final   Special Requests Normal  Final   Sputum evaluation   Final    THIS SPECIMEN IS ACCEPTABLE. RESPIRATORY CULTURE REPORT TO FOLLOW.   Report Status 09/20/2015 FINAL  Final  Culture, respiratory (NON-Expectorated)     Status: None (Preliminary result)   Collection Time: 09/20/15  5:19 AM  Result Value Ref Range Status   Specimen Description EXPECTORATED SPUTUM  Final   Special Requests NONE  Final  Gram Stain   Final    ABUNDANT WBC PRESENT, PREDOMINANTLY PMN NO SQUAMOUS EPITHELIAL CELLS SEEN FEW GRAM POSITIVE COCCI IN PAIRS Performed at [facility]    Culture   Final    Culture reincubated for better growth Performed at [facility]    Report Status PENDING  Incomplete  Culture, blood (routine x 2)     Status: None (Preliminary result)   Collection Time: 09/20/15  5:31 AM  Result Value Ref Range Status   Specimen Description BLOOD RIGHT FOREARM  Final   Special Requests BOTTLES DRAWN AEROBIC AND ANAEROBIC 5CC  Final   Culture NO GROWTH 3 DAYS  Final   Report Status PENDING  Incomplete  Urine culture     Status: None   Collection Time: 09/20/15  1:46 PM  Result Value Ref Range Status   Specimen Description URINE, CLEAN CATCH  Final   Special Requests Normal  Final   Culture MULTIPLE SPECIES PRESENT, SUGGEST RECOLLECTION  Final   Report Status 09/21/2015 FINAL  Final   Studies/Results: No results found. Medications: I have reviewed the patient's current medications. Scheduled Meds: . amLODipine  10 mg Oral Daily  . cefTRIAXone  (ROCEPHIN)  IV  2 g Intravenous Q24H  . doxycycline  100 mg Oral Q12H  . heparin  5,000 Units Subcutaneous 3 times per day  . ipratropium-albuterol  3 mL Nebulization TID  . lisinopril  10 mg Oral Daily  . predniSONE  40 mg Oral Q breakfast  . sodium chloride  3 mL Intravenous Q12H   Continuous Infusions:   PRN Meds:.acetaminophen **OR** acetaminophen, albuterol, dextromethorphan-guaiFENesin, ibuprofen Assessment/Plan: Principal Problem:   COPD exacerbation (HCC) Active Problems:   Hypertension   Atypical pneumonia   Hypokalemia   Tachycardia   Prolonged Q-T interval on ECG   Dysphagia   Dyspnea  COPD Exacerbation and CAP:   Respiratory culture showing abundant WBCs and a few gram + cocci in pairs. Urine legionella negative. Flu neg. CXR showing b/l lower lobe bronchopneumonia that was also seen on CT chest on admission. Had wheezing and SOB likely 2/2 to COPD exacerbation  - Doxycycline 100 mg po q12h, will stop if final sputum cx comes back showing strep pneum - Ceftriaxone IV 2g daily continue for 7 days total from 11/2 --> 11/8 ( will do ceftin PO on discharge to complete the course) - Prednisone solution (40 mg total daily) ( will also do 7 days from 11/01 >> 11/7 without taper - Duoneb q4h, Albuterol q4h - Mucinex DM - F/u final blood culture results  - F/u final respiratory culture results  - Repeat PFTs as an outpatient - Follow-up with pulmonologist Dr. Chase Caller as outpatient  - will ambulate patient to see if she would require o2 at least temporarily at home.   Difficulty swallowing pills  Patient states she has always had difficulty swallowing pills. States she has no difficulty swallowing food because she always chews it well. Denies having any difficulty swallowing liquids. Denies having odynphagia. Barium swallow showing a short segment of gradual luminal narrowing in the upper esophagus compatible with a stricture. -Will arrange for outpatient EGD with  GI.  Hypokalemia: 3.4 today. Likely due to increased albuterol usage.  - KCl 40 mEq  - Repeat BMP tomorrow AM  Borderline anemia - hgb 12.2. Baseline is not very clear, has been low in 9-10's and also higher.  - no bleeding, hgb overall stable. Was 15 likely 2/2 to dehydration/volume contraction on presentation.  Tachycardia: HR in the 120s this AM, which is close to her baseline (patient seems to be chronically tachycardic). Likely related to her COPD exacerbation and pneumonia. Will continue to monitor for improvement.   Long QT: QTc of 431 on EKG.  -Continue to monitor. Avoid QT prolonging drugs.   Hypertension: controlled - Continue Lisinopril 10 mg qd and Amlodipine 10 mg qd  DVT Prophylaxis: Heparin [place]  Diet: Heart Healthy  Code Status: Full.   Dispo: Disposition is deferred at this time, awaiting improvement of current medical problems.  Anticipated discharge in approximately 1-2 day(s).   The patient does have a current PCP (Juluis Mire, MD) and does need an [facility] hospital follow-up appointment after discharge.  The patient does not have transportation limitations that hinder transportation to clinic appointments.  .Services Needed at time of discharge: Y = Yes, Blank = No PT:   OT:   RN:   Equipment:   Other:     LOS: 2 days   Dellia Nims, MD 09/23/2015, 9:48 AM

## 2015-09-23 NOTE — Progress Notes (Signed)
CM Spoke to patient at the bedside who asked about pulse ox cost. CM called [facility] where patient will get medications filled and cost was $29.99 if patient is interested. Patient verbalized understanding. CM spoke with Tiffany @ [facility] who approved referral for Oxygen which was being delivered to the room and will deliver to the house. CM called and spoke with Dr. Genene Churn after paging him at [phone] and told him that patient had oxygen and medication assistance.

## 2015-09-24 DIAGNOSIS — J449 Chronic obstructive pulmonary disease, unspecified: Secondary | ICD-10-CM

## 2015-09-24 MED ORDER — POTASSIUM CHLORIDE 20 MEQ PO PACK: 20.0000 meq | PACK | Freq: Once | ORAL | Status: DC

## 2015-09-24 NOTE — Progress Notes (Signed)
Pt given discharge instructions, verbalized understanding prescriptions given to patient home with [facility]. Wheeled out via wheelchair.

## 2015-09-24 NOTE — Progress Notes (Signed)
CM received call from RN stating pt's [place] letter wasn't working.  CM re-entered [facility] information; called [facility] [phone]; and waited on phone while pharmacist ran [facility] which was fine.  Pharmacist stated pt at pharmacy and will receive medication.  No other CM needs.

## 2015-09-24 NOTE — Progress Notes (Signed)
Siting on RA 91% with ambulation 87% RA . Walking with O2 2L 92%.

## 2015-09-24 NOTE — Discharge Summary (Signed)
Name: Cynthia Martinez MRN: [id number] DOB: [birth year] 54 y.o. PCP: Juluis Mire, MD  Date of Admission: 09/20/2015 12:51 AM Date of Discharge: 09/24/2015 Attending Physician: Aldine Contes, MD  Discharge Diagnosis:  Principal Problem:   Pneumonia due to Haemophilus influenzae ([place]) Active Problems:   Hypertension   COPD exacerbation ([place])   Hypokalemia   Tachycardia   Prolonged Q-T interval on ECG   Dysphagia   Dyspnea  Discharge Medications:   Medication List    STOP taking these medications        ibuprofen 400 MG tablet  Commonly known as:  ADVIL,MOTRIN      TAKE these medications        acetaminophen 650 MG CR tablet  Commonly known as:  TYLENOL  Take 650 mg by mouth every 8 (eight) hours as needed for pain.     albuterol 108 (90 BASE) MCG/ACT inhaler  Commonly known as:  VENTOLIN HFA  Inhale 2 puffs into the lungs every 4 (four) hours as needed for wheezing.     amLODipine 10 MG tablet  Commonly known as:  NORVASC  Take 1 tablet (10 mg total) by mouth daily.     cefUROXime 250 MG/5ML suspension  Commonly known as:  CEFTIN  Take 10 mLs (500 mg total) by mouth 2 (two) times daily.     dextromethorphan-guaiFENesin 30-600 MG 12hr tablet  Commonly known as:  MUCINEX DM  Take 1 tablet by mouth 2 (two) times daily as needed for cough.     fluticasone 50 MCG/ACT nasal spray  Commonly known as:  FLONASE  Place 2 sprays into both nostrils daily.     lisinopril 10 MG tablet  Commonly known as:  PRINIVIL,ZESTRIL  Take 1 tablet (10 mg total) by mouth daily.     mirtazapine 15 MG tablet  Commonly known as:  REMERON  Take 1 tablet (15 mg total) by mouth at bedtime.     potassium chloride 20 MEQ packet  Commonly known as:  KLOR-CON  Take 20 mEq by mouth once.     predniSONE 5 MG/ML concentrated solution  Take 8 mLs (40 mg total) by mouth daily with breakfast.        Disposition and follow-up:   Cynthia Martinez was discharged from [facility] in Stable condition.  At the hospital follow up visit please address:  1.  Patient was admitted initially for COPD exacerbation with possibly atypical PNA. Then later sputum cx grew H. Influenza. She will complete 7 day course of Ceftin for her H. Influenza PNA (allergic to penicillin and sulfa).  Also will complete 7 days of prednisone for COPD exacerbation. These should be completed by 11/8.   She also remained hypoxic with ambulation in the hospital. We are discharging her with home O2. Please check her ambulatory sat on follow up and discontinue O2 if appropriate.  She will need outpatient PFT when she is better from her current episode of resp problem.  She will need EGD outpatient to evaluate for her Esophageal narrowing seen on barium swallow.  Please check a BMET. She had hypokalemia in the hospital. Discharging home with 40meq K+.   2.  Labs / imaging needed at time of follow-up: BMET  3.  Pending labs/ test needing follow-up: none.  Follow-up Appointments: Follow-up Information    Follow up with [facility]   Why:  This company is prividing your oxygen and supplies. Please call the number listed above for  any needs.    Contact information:   [address] [place] [address] [phone]       Follow up with Juluis Mire, MD.   Specialty:  Internal Medicine   Contact information:   [place] [address] [phone]       Discharge Instructions:   Consultations:    Procedures Performed:  Dg Chest 2 View  09/21/2015  CLINICAL DATA:  Cough and congestion. Shortness of breath for several days. EXAM: CHEST  2 VIEW COMPARISON:  09/21/2015 FINDINGS: Normal heart size. Bilateral lower lobe peribronchial opacities are identified suspicious for bronchopneumonia. There is also bilateral lower lobe bronchial wall thickening and possibly bronchiectasis. The lungs are hyperinflated. No pleural effusion or edema.  IMPRESSION: 1. Suspect bilateral lower lobe bronchopneumonia. Electronically Signed   By: Kerby Moors M.D.   On: 09/21/2015 09:24   Dg Chest 2 View  09/20/2015  CLINICAL DATA:  Shortness of breath. Fever and cough since last Thursday. EXAM: CHEST  2 VIEW COMPARISON:  03/30/2014 FINDINGS: Hyperinflation in the lungs suggesting emphysema. No focal airspace disease or consolidation. Central interstitial changes with peribronchial thickening suggesting chronic bronchitis. Calcifications in the upper lungs suggesting calcified granulomas. No blunting of costophrenic angles. No pneumothorax. Normal heart size and pulmonary vascularity. Thoracic scoliosis convex towards the right. Mediastinal contours appear intact. IMPRESSION: Emphysematous changes and chronic bronchitic changes in the lungs. No evidence of active consolidation. Electronically Signed   By: Lucienne Capers M.D.   On: 09/20/2015 01:17   Ct Angio Chest Pe W/cm &/or Wo Cm  09/20/2015  CLINICAL DATA:  Tachycardia and hypoxia.  Flu like symptoms. EXAM: CT ANGIOGRAPHY CHEST WITH CONTRAST TECHNIQUE: Multidetector CT imaging of the chest was performed using the standard protocol during bolus administration of intravenous contrast. Multiplanar CT image reconstructions and MIPs were obtained to evaluate the vascular anatomy. CONTRAST:  64mL OMNIPAQUE IOHEXOL 350 MG/ML SOLN COMPARISON:  09/20/2015 radiographs FINDINGS: Cardiovascular: There is good opacification of the pulmonary arteries. There is no pulmonary embolism. The thoracic aorta is normal in caliber and intact. Lungs: Fine interstitial nodularity is scattered throughout both lungs, greatest in the right middle lobe base and left lower lobe posterior base were there is some confluence. This most likely is an infectious or inflammatory process, probably an atypical or viral pneumonia given the clinical history. Central airways: Patent. Mild bronchial wall thickening, particularly in the lower lobes.  Effusions: None Lymphadenopathy: None Esophagus: Unremarkable Upper abdomen: Unremarkable Musculoskeletal: No significant abnormality Review of the MIP images confirms the above findings. IMPRESSION: 1. Negative for acute pulmonary embolism. 2. Diffuse nodularity and mild bronchial thickening, suspicious for atypical or viral pneumonia in this clinical setting. Electronically Signed   By: Andreas Newport M.D.   On: 09/20/2015 04:24   Dg Esophagus  09/20/2015  CLINICAL DATA:  Difficulty swallowing pills. EXAM: ESOPHOGRAM/BARIUM SWALLOW TECHNIQUE: Single contrast examination was performed using  barium. FLUOROSCOPY TIME:  Radiation Exposure Index (as provided by the fluoroscopic device): If the device does not provide the exposure index: Fluoroscopy Time:  0 minutes and 42 seconds. Number of Acquired Images: COMPARISON:  None. FINDINGS: Study was limited by the patient's inability to swallow effervescent crystals. Patient also would not swallow a pill as a pill getting caught in her throat was the reason for her presentation to the emergency room. The patient was unable to lie flat. AP and lateral views of the hypopharynx while swallowing are unremarkable although somewhat limited by small volume contrast  taken with each swallow. Oblique views of the esophagus show a short segment of apparent luminal stricturing in the proximal 1/3 of the esophagus, at about the level of the transverse aorta. Despite repeated swallows, this area of esophagus did not fully distend. Shouldering along this narrowing is relatively smooth with no substantial mucosal irregularity. IMPRESSION: Very limited study as outlined above. There does appear to be a short segment of gradual luminal narrowing in the upper esophagus. Imaging features would be compatible with stricture and imaging features suggest a benign etiology although neoplastic involvement cannot be excluded. Consider upper endoscopy to further evaluate. Electronically Signed    By: Misty Stanley M.D.   On: 09/20/2015 17:01    Admission HPI:   Cynthia Martinez is a 54 year old woman with a past medical history of COPD (2013 FEV1 0.9L/41%, ratio 42, DLCO 14/58% per pulmonology note) per pulmonology note), previously treated HCV (followed by Dr. Linus Salmons, undetectable HCV levels), and a chronic cough who presents with shortness of breath. This problem started on 10/27 and has been associated with headache, an elevated temperature (Tmax at home 100.1), body aches, yellow sputum production, sore throat, and chills. She has taken Robitussin DM to minimal or no effect. She has also noticed her heart rate and blood pressure have increased, taking her blood pressure at home at it was 160/103. She takes only ventolin inhalers for her COPD at home, only needing to take use it once per month. Since the onset of her symptoms, she has needed to use it three times daily. She also endorses some weight loss (6-7 pounds) since March while she was on [place]. Otherwise, she denies any chest pain, light-headedness, blackouts, nausea, vomiting, diarrhea, abdominal pain, or sick contacts. She is a former smoker (16.5 pack-year history, quit 2013), and is a non-drinker. Other than her inhaler, she only takes her mirtazapine (started in September), lisinopril, and amlodipine.   In the ED, she was noted to be tachycardic (116-138), hypertensive (up to 165/105), 93% on room air. She had a leukocytosis to 13.7. A CXR demonstrated emphysematous changes without consolidation. A CT Angio showed no pulmonary embolism, but exhibited diffuse nodularity and bronchial thickening suggestive of an atypical or viral pneumonia. She was given 125 mg solumedrol in the ED. She was administered oral doxycycline, but she could not tolerate. An EKG demonstrated a HR 132 and QTc 574. Blood cultures were ordered.   Hospital Course by problem list: Principal Problem:   Pneumonia due to Haemophilus influenzae ([place]) Active  Problems:   Hypertension   COPD exacerbation (HCC)   Hypokalemia   Tachycardia   Prolonged Q-T interval on ECG   Dysphagia   Dyspnea   COPD Exacerbation and H. Influenza (beta lactamase producing) pneumonia CXR showing b/l lower lobe bronchopneumonia that was also seen on CT chest on admission. Sputum cx was GPC in pair initially (was on ceftriaxone), and also was on doxy initially for copd exacerbation, then final sputum cx resulted as H. Influenza (beta lactamase producing) Wheezing, SOB, and coughing much improved now.   - Doxycycline 100 mg po q12h 11/1 >> D/c 11/6. - Ceftriaxone IV 2g daily continue for 7 days total from 11/2 --> 11/8 ( will do ceftin PO on discharge to complete the course for H influenza PNA) - Prednisone solution (40 mg total daily) ( will also do 7 days from 11/01 >> 11/7 without taper - Duoneb q4h, Albuterol q4h - Mucinex DM - Repeat PFTs as an outpatient when stable from  her current episode.  - Follow-up with pulmonologist Dr. Chase Caller as outpatient  - discharge with home O2, expect she will not need it very long as she improves from her current pneumonia.   Difficulty swallowing pills  Patient states she has always had difficulty swallowing pills. States she has no difficulty swallowing food because she always chews it well. Denies having any difficulty swallowing liquids. Denies having odynphagia. Barium swallow showing a short segment of gradual luminal narrowing in the upper esophagus compatible with a stricture. - will need outpatient EGD  Hypokalemia: repleted while here. Likely 2/2 to low PO intake.  - will need BMET on follow up. wil discharge on 20 mEQ K+.   Borderline anemia - hgb 12.2. Baseline is not very clear, has been low in 9-10's and also higher.  - no bleeding, hgb overall stable. Was 15 likely 2/2 to dehydration/volume contraction on presentation.   Tachycardia: HR in the 100's which is close to her baseline (patient seems to be  chronically tachycardic). Likely related to her COPD exacerbation and pneumonia.   Long QT - resolved. Avoided QT prolonging drugs. - repeat QTc of 431 on EKG.   Hypertension: controlled - Continue Lisinopril 10 mg qd and Amlodipine 10 mg qd    Discharge Vitals:   BP 101/61 mmHg  Pulse 94  Temp(Src) 98.7 F (37.1 C) (Oral)  Resp 18  Ht 5\' 3"  (1.6 m)  Wt 96 lb 1.6 oz (43.591 kg)  BMI 17.03 kg/m2  SpO2 96%  Discharge Labs:  No results found for this or any previous visit (from the past 24 hour(s)).  Signed: Dellia Nims, MD 09/24/2015, 10:02 AM    Services Ordered on Discharge: home o2.

## 2015-09-24 NOTE — Progress Notes (Signed)
Subjective: Doing much better, cough and sob is better. Wants to go home.  No fever/chills/n/v/cp.  Objective: Vital signs in last 24 hours: Filed Vitals:   09/23/15 1907 09/23/15 2046 09/24/15 0500 09/24/15 0839  BP: 113/71  144/88   Pulse: 96  94   Temp: 98.1 F (36.7 C)  98.7 F (37.1 C)   TempSrc: Oral  Oral   Resp: 18  18   Height:      Weight:      SpO2: 98% 98% 95% 96%   Weight change:   Intake/Output Summary (Last 24 hours) at 09/24/15 0947 Last data filed at 09/24/15 0846  Gross per 24 hour  Intake    240 ml  Output   1450 ml  Net  -1210 ml    Physical exam  General: sitting in bed, NAD HEENT: eomi, PERRL Cardiology: Tachycardic with regular rhythm. No m/r/g Pulmonary: good air movement, no wheezing heard today.  Abdomen: Soft, non-tender, non-distended. Normal bowel sounds. Skin: Warm and dry. Extremities: No clubbing, cyanosis, or edema. Intact distal pulses.   Lab Results: Basic Metabolic Panel:  Recent Labs Lab 09/20/15 0708  09/22/15 0249 09/23/15 0358  NA  --   < > 141 140  K  --   < > 3.4* 3.2*  CL  --   < > 105 102  CO2  --   < > 27 28  GLUCOSE  --   < > 95 89  BUN  --   < > 9 6  CREATININE  --   < > 0.62 0.62  CALCIUM  --   < > 9.1 9.4  MG 1.9  --   --   --   < > = values in this interval not displayed. Liver Function Tests:  Recent Labs Lab 09/20/15 0115  AST 18  ALT 11*  ALKPHOS 94  BILITOT 0.8  PROT 8.0  ALBUMIN 3.5   CBC:  Recent Labs Lab 09/21/15 0715 09/22/15 0249 09/23/15 0358  WBC 15.3* 23.1* 15.5*  NEUTROABS 11.7*  --   --   HGB 14.2 11.8* 12.2  HCT 42.9 36.5 38.8  MCV 84.1 86.7 85.8  PLT 332 313 338    Micro Results: Recent Results (from the past 240 hour(s))  Culture, blood (routine x 2)     Status: None (Preliminary result)   Collection Time: 09/20/15  5:03 AM  Result Value Ref Range Status   Specimen Description BLOOD LEFT ANTECUBITAL  Final   Special Requests BOTTLES DRAWN AEROBIC AND  ANAEROBIC 5CC  Final   Culture NO GROWTH 3 DAYS  Final   Report Status PENDING  Incomplete  Culture, expectorated sputum-assessment     Status: None   Collection Time: 09/20/15  5:19 AM  Result Value Ref Range Status   Specimen Description SPUTUM  Final   Special Requests Normal  Final   Sputum evaluation   Final    THIS SPECIMEN IS ACCEPTABLE. RESPIRATORY CULTURE REPORT TO FOLLOW.   Report Status 09/20/2015 FINAL  Final  Culture, respiratory (NON-Expectorated)     Status: None   Collection Time: 09/20/15  5:19 AM  Result Value Ref Range Status   Specimen Description EXPECTORATED SPUTUM  Final   Special Requests NONE  Final   Gram Stain   Final    ABUNDANT WBC PRESENT, PREDOMINANTLY PMN NO SQUAMOUS EPITHELIAL CELLS SEEN FEW GRAM POSITIVE COCCI IN PAIRS Performed at [facility]    Culture   Final  MODERATE HAEMOPHILUS INFLUENZAE Note: BETA LACTAMASE POSITIVE Performed at [facility]    Report Status 09/23/2015 FINAL  Final  Culture, blood (routine x 2)     Status: None (Preliminary result)   Collection Time: 09/20/15  5:31 AM  Result Value Ref Range Status   Specimen Description BLOOD RIGHT FOREARM  Final   Special Requests BOTTLES DRAWN AEROBIC AND ANAEROBIC 5CC  Final   Culture NO GROWTH 3 DAYS  Final   Report Status PENDING  Incomplete  Urine culture     Status: None   Collection Time: 09/20/15  1:46 PM  Result Value Ref Range Status   Specimen Description URINE, CLEAN CATCH  Final   Special Requests Normal  Final   Culture MULTIPLE SPECIES PRESENT, SUGGEST RECOLLECTION  Final   Report Status 09/21/2015 FINAL  Final   Studies/Results: No results found. Medications: I have reviewed the patient's current medications. Scheduled Meds: . amLODipine  10 mg Oral Daily  . cefTRIAXone (ROCEPHIN)  IV  2 g Intravenous Q24H  . doxycycline  100 mg Oral Q12H  . heparin  5,000 Units Subcutaneous 3 times per day  . ipratropium-albuterol  3 mL Nebulization TID    . lisinopril  10 mg Oral Daily  . predniSONE  40 mg Oral Q breakfast  . sodium chloride  3 mL Intravenous Q12H   Continuous Infusions:   PRN Meds:.acetaminophen **OR** acetaminophen, albuterol, dextromethorphan-guaiFENesin, ibuprofen Assessment/Plan: Principal Problem:   Pneumonia due to Haemophilus influenzae (HCC) Active Problems:   Hypertension   COPD exacerbation (HCC)   Hypokalemia   Tachycardia   Prolonged Q-T interval on ECG   Dysphagia   Dyspnea  COPD Exacerbation and H. Influenza (beta lactamase producing) pneumonia CXR showing b/l lower lobe bronchopneumonia that was also seen on CT chest on admission. Sputum cx was GPC in pair initially (was on ceftriaxone), and also was on doxy initially for copd exacerbation, then final sputum cx resulted as H. Influenza (beta lactamase producing)  Wheezing, SOB, and coughing much improved now.   - Doxycycline 100 mg po q12h 11/1 >> D/c 11/6. - Ceftriaxone IV 2g daily continue for 7 days total from 11/2 --> 11/8 ( will do ceftin PO on discharge to complete the course for H influenza PNA) - Prednisone solution (40 mg total daily) ( will also do 7 days from 11/01 >> 11/7 without taper - Duoneb q4h, Albuterol q4h - Mucinex DM - Repeat PFTs as an outpatient when stable from her current episode.  - Follow-up with pulmonologist Dr. Chase Caller as outpatient  - discharge with home O2, expect she will not need it very long as she improves from her current pneumonia.   Difficulty swallowing pills  Patient states she has always had difficulty swallowing pills. States she has no difficulty swallowing food because she always chews it well. Denies having any difficulty swallowing liquids. Denies having odynphagia. Barium swallow showing a short segment of gradual luminal narrowing in the upper esophagus compatible with a stricture. - will need outpatient EGD  Hypokalemia: repleted while here. Likely 2/2 to low PO intake.  - will need BMET on  follow up. wil discharge on 20 mEQ K+.   Borderline anemia - hgb 12.2. Baseline is not very clear, has been low in 9-10's and also higher.  - no bleeding, hgb overall stable. Was 15 likely 2/2 to dehydration/volume contraction on presentation.   Tachycardia: HR in the 100's which is close to her baseline (patient seems to be chronically tachycardic). Likely  related to her COPD exacerbation and pneumonia.   Long QT - resolved. Avoided QT prolonging drugs. - repeat QTc of 431 on EKG.   Hypertension: controlled - Continue Lisinopril 10 mg qd and Amlodipine 10 mg qd  DVT Prophylaxis: Heparin [place]  Diet: Heart Healthy  Code Status: Full.   Dispo: Disposition is deferred at this time, awaiting improvement of current medical problems.  Anticipated discharge in approximately 1-2 day(s).   The patient does have a current PCP (Juluis Mire, MD) and does need an [facility] hospital follow-up appointment after discharge.  The patient does not have transportation limitations that hinder transportation to clinic appointments.  .Services Needed at time of discharge: Y = Yes, Blank = No PT:   OT:   RN:   Equipment:   Other:     LOS: 3 days   Dellia Nims, MD 09/24/2015, 9:47 AM

## 2015-09-25 LAB — CULTURE, BLOOD (ROUTINE X 2)
CULTURE: NO GROWTH
CULTURE: NO GROWTH

## 2015-09-29 ENCOUNTER — Ambulatory Visit (INDEPENDENT_AMBULATORY_CARE_PROVIDER_SITE_OTHER): Payer: Self-pay | Admitting: Internal Medicine

## 2015-09-29 VITALS — BP 99/60 | HR 92 | Temp 98.2°F | Wt 102.3 lb

## 2015-09-29 DIAGNOSIS — R000 Tachycardia, unspecified: Secondary | ICD-10-CM

## 2015-09-29 DIAGNOSIS — J14 Pneumonia due to Hemophilus influenzae: Secondary | ICD-10-CM

## 2015-09-29 DIAGNOSIS — F32A Depression, unspecified: Secondary | ICD-10-CM

## 2015-09-29 DIAGNOSIS — R1310 Dysphagia, unspecified: Secondary | ICD-10-CM

## 2015-09-29 DIAGNOSIS — B370 Candidal stomatitis: Secondary | ICD-10-CM

## 2015-09-29 DIAGNOSIS — J449 Chronic obstructive pulmonary disease, unspecified: Secondary | ICD-10-CM

## 2015-09-29 DIAGNOSIS — Z0000 Encounter for general adult medical examination without abnormal findings: Secondary | ICD-10-CM

## 2015-09-29 DIAGNOSIS — F329 Major depressive disorder, single episode, unspecified: Secondary | ICD-10-CM

## 2015-09-29 DIAGNOSIS — Z87891 Personal history of nicotine dependence: Secondary | ICD-10-CM

## 2015-09-29 DIAGNOSIS — I10 Essential (primary) hypertension: Secondary | ICD-10-CM

## 2015-09-29 DIAGNOSIS — Z79899 Other long term (current) drug therapy: Secondary | ICD-10-CM

## 2015-09-29 MED ORDER — MIRTAZAPINE 30 MG PO TABS
30.0000 mg | ORAL_TABLET | Freq: Every day | ORAL | Status: DC
Start: 2015-09-29 — End: 2016-01-12

## 2015-09-29 MED ORDER — NYSTATIN 100000 UNIT/ML MT SUSP: OROMUCOSAL | Status: DC

## 2015-09-29 MED ORDER — METOPROLOL TARTRATE 25 MG PO TABS: 25.0000 mg | ORAL_TABLET | Freq: Two times a day (BID) | ORAL | Status: DC

## 2015-09-29 MED ORDER — DM-GUAIFENESIN ER 30-600 MG PO TB12: 1.0000 | ORAL_TABLET | Freq: Two times a day (BID) | ORAL | Status: DC | PRN

## 2015-09-29 NOTE — Patient Instructions (Addendum)
-  Start taking mirtazapine 30 mg daily to help with your appetite and mood -Will check your bloodwork today -Will refer you to GI for EGD -Start taking nystatin four times daily for the fungus in your mouth until it disappears -You will need another chest xray on/after Dec 5  -Start taking mucinex DM as needed for cough -Will see you back end of February, very nice seeing you!   General Instructions:   Please bring your medicines with you each time you come to clinic.  Medicines may include prescription medications, over-the-counter medications, herbal remedies, eye drops, vitamins, or other pills.   Progress Toward Treatment Goals:  No flowsheet data found.  Self Care Goals & Plans:  No flowsheet data found.  No flowsheet data found.   Care Management & Community Referrals:  No flowsheet data found.

## 2015-09-29 NOTE — Progress Notes (Signed)
Patient ID: LATRINDA CARAVEO, female   DOB: [birth year], 54 y.o.   MRN: [id number]    Subjective:   Patient ID: MALONIE DEGREE female   DOB: Jul 15, 1961 53 y.o.   MRN: [id number]  HPI: Ms.Kristee D Korch is a 54 y.o. very pleasant woman with past medical history of hypertension, pernicious anemia, treated hepatitis C infection, underweight, and COPD who presents for hospital follow-up visit.   She was recently hospitalized from 11/2-6 for COPD exacerbation in setting of H. Influenza bilateral lobe pneumonia. She was treated with 7-day course of ceftin and prednisone which she reports finishing both. She was found to be hypoxic with ambulation requiring home oxygen on discharge. She had PFT's on 09/28/12 consistent with COPD Stage 3 and is currently on albuterol as needed which she has not used recently. She reports persistent dyspnea with exertion, wheezing night, yellow-productive cough, and occasional subjective fever. She did not receive mucinex-DM that was prescribed at discharge. She quit smoking in 2010.   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   At last visit on 08/04/15 she was started on mirtazapine 15 mg daily to increase appetite and improve mood. She reports 4 lb weight gain since last visit despite recent illness.She has also noticed moderate improvement in mood.  She reports compliance with taking amlodipine and lisinopril for hypertension. She was previously on metoprolol tartrate for hypertension but this was changed to lisinopril in June due to insurance preference. She has headache but denies blurry vision, chest pain, LE edema, or lightheadedness. She has not been taking potassium pills that were prescribed on discharge because she never received them.     Past Medical History  Diagnosis Date  . Anemia   . DUB (dysfunctional uterine bleeding)   . Hypertension   . Hep C  w/o coma, chronic ([place])   . COPD (chronic obstructive pulmonary disease) ([place])   . Shortness of breath dyspnea   . Depression   . Anxiety    Current Outpatient Prescriptions  Medication Sig Dispense Refill  . acetaminophen (TYLENOL) 650 MG CR tablet Take 650 mg by mouth every 8 (eight) hours as needed for pain.    Marland Kitchen albuterol (VENTOLIN HFA) 108 (90 BASE) MCG/ACT inhaler Inhale 2 puffs into the lungs every 4 (four) hours as needed for wheezing. 6.7 g 6  . amLODipine (NORVASC) 10 MG tablet Take 1 tablet (10 mg total) by mouth daily. 90 tablet 3  . dextromethorphan-guaiFENesin (MUCINEX DM) 30-600 MG 12hr tablet Take 1 tablet by mouth 2 (two) times daily as needed for cough. 30 tablet 0  . fluticasone (FLONASE) 50 MCG/ACT nasal spray Place 2 sprays into both nostrils daily. (Patient taking differently: Place 2 sprays into both nostrils daily as needed for allergies. ) 16 g 2  . lisinopril (PRINIVIL,ZESTRIL) 10 MG tablet Take 1 tablet (10 mg total) by mouth daily. 90 tablet 3  . mirtazapine (REMERON) 15 MG tablet Take 1 tablet (15 mg total) by mouth at bedtime. 30 tablet 2  . potassium chloride (KLOR-CON) 20 MEQ packet Take 20 mEq by mouth once. 30 packet 0  . predniSONE 5 MG/ML concentrated solution Take 8 mLs (40 mg total) by mouth daily with breakfast. 30 mL 0   No current facility-administered medications for this visit.   Family History  Problem Relation Age of Onset  . Diabetes     Social History   Social  History  . Marital Status: Single    Spouse Name: N/A  . Number of Children: N/A  . Years of Education: N/A   Social History Main Topics  . Smoking status: Former Smoker -- 0.50 packs/day for 9 years    Types: Cigarettes    Quit date: 07/19/2009  . Smokeless tobacco: Never Used  . Alcohol Use: No     Comment: socially  . Drug Use: No  . Sexual Activity:    Partners: Male    Birth Control/ Protection: Condom   Other Topics Concern  . Not on file   Social History  Narrative   Lives with fiancee in [place]   2 daughter in 67's and 67'3.   Worked as Secretary/administrator in past.            Review of Systems: Review of Systems  Constitutional: Positive for fever (subjective ).       Weight gain  HENT: Negative for congestion and sore throat.   Eyes: Negative for blurred vision.  Respiratory: Positive for cough, sputum production (yellow ), shortness of breath and wheezing (at night).   Gastrointestinal: Negative for nausea, vomiting, abdominal pain, diarrhea and constipation.       Dysphagia to pills  Genitourinary: Negative for dysuria, urgency and frequency.  Musculoskeletal: Negative for myalgias.  Neurological: Positive for headaches. Negative for dizziness.  Psychiatric/Behavioral: Positive for depression (improved).    Objective:  Physical Exam: Filed Vitals:   09/29/15 1615  BP: 99/60  Pulse: 92  Temp: 98.2 F (36.8 C)  TempSrc: Oral  Weight: 102 lb 4.8 oz (46.403 kg)  SpO2: 98%    Physical Exam  Constitutional: She is oriented to person, place, and time. No distress.  Thin-appearing  HENT:  Head: Normocephalic and atraumatic.  Right Ear: External ear normal.  Left Ear: External ear normal.  Mouth/Throat: Oropharyngeal exudate (oral thrush in posterior pharynx and buccal mucosa on right ) present.  Eyes: EOM are normal.  Neck: Normal range of motion. Neck supple.  Cardiovascular: Normal rate, regular rhythm and normal heart sounds.   Pulmonary/Chest: Effort normal. No respiratory distress. She has no wheezes. She has no rales.  Deceased BS at bases  Abdominal: Soft. Bowel sounds are normal. She exhibits no distension. There is no tenderness. There is no rebound and no guarding.  Musculoskeletal: Normal range of motion. She exhibits no edema or tenderness.  Neurological: She is alert and oriented to person, place, and time.  Skin: Skin is warm and dry. No rash noted. She is not diaphoretic. No erythema. No pallor.  Psychiatric: She  has a normal mood and affect. Her behavior is normal. Judgment and thought content normal.    Assessment & Plan:   Please see problem list for problem-based assessment and plan

## 2015-09-30 ENCOUNTER — Encounter: Payer: Self-pay | Admitting: Internal Medicine

## 2015-09-30 DIAGNOSIS — B370 Candidal stomatitis: Secondary | ICD-10-CM | POA: Insufficient documentation

## 2015-09-30 LAB — BMP8+ANION GAP
ANION GAP: 15 mmol/L (ref 10.0–18.0)
BUN / CREAT RATIO: 25 — AB (ref 9–23)
BUN: 18 mg/dL (ref 6–24)
CO2: 28 mmol/L (ref 18–29)
Calcium: 9.2 mg/dL (ref 8.7–10.2)
Chloride: 98 mmol/L (ref 97–106)
Creatinine, Ser: 0.73 mg/dL (ref 0.57–1.00)
GFR calc Af Amer: 106 mL/min/{1.73_m2} (ref >59)
GFR calc non Af Amer: 92 mL/min/{1.73_m2} (ref >59)
GLUCOSE: 92 mg/dL (ref 65–99)
Potassium: 3.8 mmol/L (ref 3.5–5.2)
Sodium: 141 mmol/L (ref 136–144)

## 2015-09-30 MED ORDER — AMLODIPINE BESYLATE 10 MG PO TABS: 10.0000 mg | ORAL_TABLET | Freq: Every day | ORAL | Status: DC

## 2015-09-30 NOTE — Assessment & Plan Note (Signed)
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   Plan:  -Refer to GI for EGD

## 2015-09-30 NOTE — Assessment & Plan Note (Signed)
Assessment: Pt with COPD Gold Stage 3 with last PFT's on 09/28/12 not on maintenance inhaler therapy who presents with recent flare in setting of pneumonia treated with corticosteroid and antibiotic therapy.   Plan:  -Pt 98% on 2L oxygen with desaturation to 92% with ambulation, due to symptoms of DOE, pt requested to continue home oxygen at this time -Pt instructed to make follow-up appointment with her pulmonologist on 10/02/15 -Needs repeat PFT's after resolution of current symptoms to decide on initiation of maintenance therapy  -Continue albuterol 2 puffs Q 4 hr PRN acute bronchospasm

## 2015-09-30 NOTE — Assessment & Plan Note (Signed)
Assessment: Pt with 44-month history of depression after passing of her father compliant with anti-depressant therapy who presents with moderate improvement in mood.   Plan:  -Increase mirtazapine from 15 mg daily to 30 mg daily at bedtime (also to increase appetite in setting of underweight)

## 2015-09-30 NOTE — Assessment & Plan Note (Addendum)
Assessment: Pt with evidence of oral thrush in setting of recent corticosteroid use with negative recent HIV testing and no history of diabetes.   Plan:  -Prescribe nystatin suspension QID until resolution  -HIV Ab was negative on 09/20/15 -Consider A1c testing at next visit

## 2015-09-30 NOTE — Assessment & Plan Note (Signed)
-  Consider DEXA scan in setting of underweight  -Obtain screening 25-OH vitamin D level at next visit  -Inquire regarding tdap vaccination at next visit

## 2015-09-30 NOTE — Assessment & Plan Note (Signed)
Assessment: Pt with moderately well-controlled hypertension compliant with two-class (CCB & ACEi) anti-hypertensive therapy who presents with blood pressure of 99/60.  Plan:  -BP 99/60 at goal <140/90  -Continue amlodipine 10 mg daily -Change lisinopril 10 mg daily back to previous metoprolol tartrate 25 mg BID in setting of chronic sinus tachycardia  -Obtain BMP --> resolved hypokalemia

## 2015-09-30 NOTE — Assessment & Plan Note (Addendum)
Assessment: Pt with recent hospitalization for H. influenza pneumonia s/p 7 day of antibiotic therapy who presents with persistent productive cough most likely post-viral in nature.   Plan:  -Prescribe mucinex-DM 1 tablet BID PRN -Repeat 2-view chest xray on/after 10/21/15 to ensure resolution

## 2015-10-02 ENCOUNTER — Telehealth: Payer: Self-pay | Admitting: Internal Medicine

## 2015-10-02 ENCOUNTER — Other Ambulatory Visit: Payer: Self-pay

## 2015-10-02 DIAGNOSIS — Z1231 Encounter for screening mammogram for malignant neoplasm of breast: Secondary | ICD-10-CM

## 2015-10-02 NOTE — Telephone Encounter (Signed)
Rx was sent to the health dept on 11/11. I have called in to [facility] on [address].  Attempt to call pt unsuccessful

## 2015-10-02 NOTE — Telephone Encounter (Signed)
Patient seen on 09/29/2015 and states her nystatin is still not at [facility] on [address].

## 2015-10-03 ENCOUNTER — Telehealth: Payer: Self-pay | Admitting: Pharmacist

## 2015-10-03 DIAGNOSIS — B370 Candidal stomatitis: Secondary | ICD-10-CM

## 2015-10-03 MED ORDER — NYSTATIN 100000 UNIT/ML MT SUSP: OROMUCOSAL | Status: DC

## 2015-10-03 NOTE — Telephone Encounter (Signed)
Contacted patient to clarify per PCP d/c lisinopril and start metoprolol tartrate 25 mg PO BID.  Also contacted [facility] to d/c lisinopril. Sent Rx to [facility] for Nystatin since not carried by [facility].

## 2015-10-03 NOTE — Progress Notes (Signed)
Internal Medicine Clinic Attending  Case discussed with Dr. Rabbani soon after the resident saw the patient.  We reviewed the resident's history and exam and pertinent patient test results.  I agree with the assessment, diagnosis, and plan of care documented in the resident's note.  

## 2015-10-05 ENCOUNTER — Other Ambulatory Visit: Payer: Self-pay | Admitting: Internal Medicine

## 2015-10-05 NOTE — Telephone Encounter (Signed)
Called rx into outpatient pharmacy per pt request.  Health dept doesn't cover med with the orange card

## 2015-10-05 NOTE — Telephone Encounter (Signed)
Pt requesting mirtazapine to be filled @ [facility].

## 2015-10-10 ENCOUNTER — Ambulatory Visit: Payer: Self-pay | Admitting: Gastroenterology

## 2015-10-20 ENCOUNTER — Ambulatory Visit
Admission: RE | Admit: 2015-10-20 | Discharge: 2015-10-20 | Disposition: A | Discharge status: Home / Self Care | Payer: No Typology Code available for payment source | Source: Ambulatory Visit

## 2015-10-20 DIAGNOSIS — Z1231 Encounter for screening mammogram for malignant neoplasm of breast: Secondary | ICD-10-CM

## 2015-11-09 ENCOUNTER — Ambulatory Visit: Payer: No Typology Code available for payment source | Admitting: Internal Medicine

## 2015-11-10 ENCOUNTER — Observation Stay (HOSPITAL_COMMUNITY)
Admission: EM | Admit: 2015-11-10 | Discharge: 2015-11-11 | Disposition: A | Discharge status: Home / Self Care | Payer: Self-pay | Attending: Oncology | Admitting: Oncology

## 2015-11-10 ENCOUNTER — Emergency Department (INDEPENDENT_AMBULATORY_CARE_PROVIDER_SITE_OTHER)
Admission: EM | Admit: 2015-11-10 | Discharge: 2015-11-10 | Disposition: A | Discharge status: Nursing Home (Includes ICF) | Payer: No Typology Code available for payment source | Source: Home / Self Care | Attending: Emergency Medicine | Admitting: Emergency Medicine

## 2015-11-10 ENCOUNTER — Emergency Department (INDEPENDENT_AMBULATORY_CARE_PROVIDER_SITE_OTHER): Payer: No Typology Code available for payment source

## 2015-11-10 ENCOUNTER — Encounter (HOSPITAL_COMMUNITY): Payer: Self-pay | Admitting: *Deleted

## 2015-11-10 ENCOUNTER — Encounter (HOSPITAL_COMMUNITY): Payer: Self-pay | Admitting: Emergency Medicine

## 2015-11-10 ENCOUNTER — Emergency Department (HOSPITAL_COMMUNITY): Payer: No Typology Code available for payment source

## 2015-11-10 DIAGNOSIS — F32A Depression, unspecified: Secondary | ICD-10-CM | POA: Diagnosis present

## 2015-11-10 DIAGNOSIS — R0602 Shortness of breath: Secondary | ICD-10-CM | POA: Insufficient documentation

## 2015-11-10 DIAGNOSIS — A419 Sepsis, unspecified organism: Secondary | ICD-10-CM

## 2015-11-10 DIAGNOSIS — Z87891 Personal history of nicotine dependence: Secondary | ICD-10-CM | POA: Insufficient documentation

## 2015-11-10 DIAGNOSIS — Z880 Allergy status to penicillin: Secondary | ICD-10-CM | POA: Insufficient documentation

## 2015-11-10 DIAGNOSIS — R000 Tachycardia, unspecified: Secondary | ICD-10-CM | POA: Insufficient documentation

## 2015-11-10 DIAGNOSIS — R918 Other nonspecific abnormal finding of lung field: Secondary | ICD-10-CM | POA: Insufficient documentation

## 2015-11-10 DIAGNOSIS — J189 Pneumonia, unspecified organism: Secondary | ICD-10-CM

## 2015-11-10 DIAGNOSIS — Z79899 Other long term (current) drug therapy: Secondary | ICD-10-CM | POA: Insufficient documentation

## 2015-11-10 DIAGNOSIS — Y95 Nosocomial condition: Secondary | ICD-10-CM | POA: Insufficient documentation

## 2015-11-10 DIAGNOSIS — Z882 Allergy status to sulfonamides status: Secondary | ICD-10-CM | POA: Insufficient documentation

## 2015-11-10 DIAGNOSIS — B182 Chronic viral hepatitis C: Secondary | ICD-10-CM | POA: Insufficient documentation

## 2015-11-10 DIAGNOSIS — J449 Chronic obstructive pulmonary disease, unspecified: Secondary | ICD-10-CM | POA: Insufficient documentation

## 2015-11-10 DIAGNOSIS — F329 Major depressive disorder, single episode, unspecified: Secondary | ICD-10-CM | POA: Insufficient documentation

## 2015-11-10 DIAGNOSIS — D510 Vitamin B12 deficiency anemia due to intrinsic factor deficiency: Secondary | ICD-10-CM | POA: Insufficient documentation

## 2015-11-10 DIAGNOSIS — F419 Anxiety disorder, unspecified: Secondary | ICD-10-CM | POA: Insufficient documentation

## 2015-11-10 DIAGNOSIS — I10 Essential (primary) hypertension: Secondary | ICD-10-CM | POA: Insufficient documentation

## 2015-11-10 LAB — CBC WITH DIFFERENTIAL/PLATELET
Basophils Absolute: 0 10*3/uL (ref 0.0–0.1)
Basophils Relative: 0 %
EOS ABS: 0 10*3/uL (ref 0.0–0.7)
EOS PCT: 0 %
HCT: 41.9 % (ref 36.0–46.0)
HEMOGLOBIN: 13.5 g/dL (ref 12.0–15.0)
LYMPHS ABS: 1.3 10*3/uL (ref 0.7–4.0)
Lymphocytes Relative: 11 %
MCH: 28.2 pg (ref 26.0–34.0)
MCHC: 32.2 g/dL (ref 30.0–36.0)
MCV: 87.7 fL (ref 78.0–100.0)
MONOS PCT: 7 %
Monocytes Absolute: 0.9 10*3/uL (ref 0.1–1.0)
NEUTROS PCT: 82 %
Neutro Abs: 10.2 10*3/uL — ABNORMAL HIGH (ref 1.7–7.7)
Platelets: 279 10*3/uL (ref 150–400)
RBC: 4.78 MIL/uL (ref 3.87–5.11)
RDW: 15.6 % — ABNORMAL HIGH (ref 11.5–15.5)
WBC: 12.4 10*3/uL — ABNORMAL HIGH (ref 4.0–10.5)

## 2015-11-10 LAB — COMPREHENSIVE METABOLIC PANEL
ALBUMIN: 3.5 g/dL (ref 3.5–5.0)
ALT: 8 U/L — AB (ref 14–54)
AST: 14 U/L — AB (ref 15–41)
Alkaline Phosphatase: 66 U/L (ref 38–126)
Anion gap: 11 (ref 5–15)
BUN: 10 mg/dL (ref 6–20)
CHLORIDE: 106 mmol/L (ref 101–111)
CO2: 22 mmol/L (ref 22–32)
CREATININE: 0.74 mg/dL (ref 0.44–1.00)
Calcium: 9 mg/dL (ref 8.9–10.3)
GFR calc Af Amer: >60 mL/min (ref >60)
GFR calc non Af Amer: >60 mL/min (ref >60)
GLUCOSE: 112 mg/dL — AB (ref 65–99)
Potassium: 4.1 mmol/L (ref 3.5–5.1)
SODIUM: 139 mmol/L (ref 135–145)
Total Bilirubin: 0.8 mg/dL (ref 0.3–1.2)
Total Protein: 7.2 g/dL (ref 6.5–8.1)

## 2015-11-10 LAB — URINALYSIS, ROUTINE W REFLEX MICROSCOPIC
BILIRUBIN URINE: NEGATIVE
Glucose, UA: NEGATIVE mg/dL
HGB URINE DIPSTICK: NEGATIVE
Ketones, ur: 40 mg/dL — AB
Leukocytes, UA: NEGATIVE
Nitrite: NEGATIVE
Protein, ur: NEGATIVE mg/dL
SPECIFIC GRAVITY, URINE: 1.014 (ref 1.005–1.030)
pH: 7.5 (ref 5.0–8.0)

## 2015-11-10 LAB — INFLUENZA PANEL BY PCR (TYPE A & B)
H1N1FLUPCR: NOT DETECTED
Influenza A By PCR: NEGATIVE
Influenza B By PCR: NEGATIVE

## 2015-11-10 LAB — I-STAT CG4 LACTIC ACID, ED: LACTIC ACID, VENOUS: 1.36 mmol/L (ref 0.5–2.0)

## 2015-11-10 MED ORDER — ENOXAPARIN SODIUM 30 MG/0.3ML ~~LOC~~ SOLN
30.0000 mg | SUBCUTANEOUS | Status: DC
  Administered 2015-11-10: 30 mg via SUBCUTANEOUS
  Filled 2015-11-10: qty 0.3

## 2015-11-10 MED ORDER — IOHEXOL 350 MG/ML SOLN
100.0000 mL | Freq: Once | INTRAVENOUS | Status: AC | PRN
  Administered 2015-11-10: 70 mL via INTRAVENOUS

## 2015-11-10 MED ORDER — MIRTAZAPINE 30 MG PO TABS
30.0000 mg | ORAL_TABLET | Freq: Every day | ORAL | Status: DC
  Administered 2015-11-10: 30 mg via ORAL
  Filled 2015-11-10: qty 1

## 2015-11-10 MED ORDER — AMLODIPINE BESYLATE 10 MG PO TABS
10.0000 mg | ORAL_TABLET | Freq: Every day | ORAL | Status: DC
  Administered 2015-11-10 – 2015-11-11 (×2): 10 mg via ORAL
  Filled 2015-11-10 (×2): qty 1

## 2015-11-10 MED ORDER — DM-GUAIFENESIN ER 30-600 MG PO TB12: 1.0000 | ORAL_TABLET | Freq: Two times a day (BID) | ORAL | Status: DC | PRN

## 2015-11-10 MED ORDER — SODIUM CHLORIDE 0.9 % IV BOLUS (SEPSIS)
1000.0000 mL | Freq: Once | INTRAVENOUS | Status: AC
  Administered 2015-11-10: 1000 mL via INTRAVENOUS

## 2015-11-10 MED ORDER — IPRATROPIUM-ALBUTEROL 0.5-2.5 (3) MG/3ML IN SOLN: 3.0000 mL | RESPIRATORY_TRACT | Status: DC | PRN

## 2015-11-10 MED ORDER — SODIUM CHLORIDE 0.9 % IV SOLN
1000.0000 mL | INTRAVENOUS | Status: DC
  Administered 2015-11-10: 1000 mL via INTRAVENOUS

## 2015-11-10 MED ORDER — DEXTROSE 5 % IV SOLN
2.0000 g | Freq: Once | INTRAVENOUS | Status: AC
  Administered 2015-11-10: 2 g via INTRAVENOUS
  Filled 2015-11-10: qty 2

## 2015-11-10 MED ORDER — IPRATROPIUM-ALBUTEROL 0.5-2.5 (3) MG/3ML IN SOLN
RESPIRATORY_TRACT | Status: AC
  Filled 2015-11-10: qty 3

## 2015-11-10 MED ORDER — VANCOMYCIN HCL IN DEXTROSE 1-5 GM/200ML-% IV SOLN
1000.0000 mg | Freq: Once | INTRAVENOUS | Status: AC
  Administered 2015-11-10: 1000 mg via INTRAVENOUS
  Filled 2015-11-10: qty 200

## 2015-11-10 MED ORDER — ACETAMINOPHEN 325 MG PO TABS: 650.0000 mg | ORAL_TABLET | Freq: Four times a day (QID) | ORAL | Status: DC | PRN

## 2015-11-10 MED ORDER — SODIUM CHLORIDE 0.9 % IJ SOLN
3.0000 mL | Freq: Two times a day (BID) | INTRAMUSCULAR | Status: DC
  Administered 2015-11-10: 3 mL via INTRAVENOUS

## 2015-11-10 MED ORDER — VANCOMYCIN HCL IN DEXTROSE 1-5 GM/200ML-% IV SOLN
1000.0000 mg | INTRAVENOUS | Status: DC
  Filled 2015-11-10: qty 200

## 2015-11-10 MED ORDER — AZTREONAM 1 G IJ SOLR
1.0000 g | Freq: Three times a day (TID) | INTRAMUSCULAR | Status: DC
  Administered 2015-11-11 (×2): 1 g via INTRAVENOUS
  Filled 2015-11-10 (×4): qty 1

## 2015-11-10 MED ORDER — METOPROLOL TARTRATE 25 MG PO TABS
25.0000 mg | ORAL_TABLET | Freq: Two times a day (BID) | ORAL | Status: DC
  Administered 2015-11-10 – 2015-11-11 (×2): 25 mg via ORAL
  Filled 2015-11-10 (×2): qty 1

## 2015-11-10 MED ORDER — SODIUM CHLORIDE 0.9 % IV BOLUS (SEPSIS)
500.0000 mL | INTRAVENOUS | Status: AC
Start: 2015-11-10 — End: 2015-11-10

## 2015-11-10 MED ORDER — SODIUM CHLORIDE 0.9 % IV SOLN: INTRAVENOUS | Status: DC

## 2015-11-10 MED ORDER — ENOXAPARIN SODIUM 40 MG/0.4ML ~~LOC~~ SOLN: 40.0000 mg | SUBCUTANEOUS | Status: DC

## 2015-11-10 MED ORDER — ACETAMINOPHEN 325 MG PO TABS
650.0000 mg | ORAL_TABLET | Freq: Once | ORAL | Status: DC
  Administered 2015-11-10: 650 mg via ORAL
  Filled 2015-11-10: qty 2

## 2015-11-10 MED ORDER — IPRATROPIUM-ALBUTEROL 0.5-2.5 (3) MG/3ML IN SOLN
3.0000 mL | Freq: Once | RESPIRATORY_TRACT | Status: AC
  Administered 2015-11-10: 3 mL via RESPIRATORY_TRACT

## 2015-11-10 NOTE — Progress Notes (Signed)
Attempted to get report. Nurse to call back.

## 2015-11-10 NOTE — ED Notes (Signed)
Patient transported to CT 

## 2015-11-10 NOTE — ED Notes (Signed)
Patient seen at urgent care follow up for pneumonia.  Xray shows new pneumonia.  Patient hypertensive, 2L n/c, received 300 mL NS.  Patient in no apparent distress at this time.

## 2015-11-10 NOTE — ED Notes (Signed)
C/o cold sx and sob States she has yellow mucous  States she has a headache, ears popping and cough

## 2015-11-10 NOTE — ED Notes (Addendum)
Patient ambulated on room air unassisted with steady gait.  O2 saturation maintained above 97%.

## 2015-11-10 NOTE — ED Provider Notes (Signed)
CSN: [id number]     Arrival date & time 11/10/15  1309 History   First MD Initiated Contact with Patient 11/10/15 1350     Chief Complaint  Patient presents with  . URI   (Consider location/radiation/quality/duration/timing/severity/associated sxs/prior Treatment) HPI She is a 54 year old woman here for evaluation of cough. Her symptoms started 4-5 days ago with nasal congestion, rhinorrhea, cough. She also reports intermittent headaches, earaches, and ear popping. She has been taking over-the-counter medications including Coricidin which do help temporarily.  She started developing yellow phlegm over the last few days. She is also developed fevers today. She has heard some intermittent wheezing which albuterol helps with. She also reports some shortness of breath. She was recently admitted to the hospital for pneumonia, and states she never really felt like she recovered despite taking all the medication prescribed.  Past Medical History  Diagnosis Date  . Anemia   . DUB (dysfunctional uterine bleeding)   . Hypertension   . Hep C w/o coma, chronic ([place])   . COPD (chronic obstructive pulmonary disease) ([place])   . Shortness of breath dyspnea   . Depression   . Anxiety    Past Surgical History  Procedure Laterality Date  . Polypectomy     Family History  Problem Relation Age of Onset  . Diabetes     Social History  Substance Use Topics  . Smoking status: Former Smoker -- 0.50 packs/day for 9 years    Types: Cigarettes    Quit date: 07/19/2009  . Smokeless tobacco: Never Used  . Alcohol Use: No     Comment: socially   OB History    No data available     Review of Systems  Allergies  Hydrochlorothiazide; Penicillins; and Sulfa antibiotics  Home Medications   Prior to Admission medications   Medication Sig Start Date End Date Taking? Authorizing Provider  acetaminophen (TYLENOL) 650 MG CR tablet Take 650 mg by mouth every 8 (eight) hours as needed for pain.     Historical Provider, MD  albuterol (VENTOLIN HFA) 108 (90 BASE) MCG/ACT inhaler Inhale 2 puffs into the lungs every 4 (four) hours as needed for wheezing. 09/23/15 12/13/17  Tasrif Ahmed, MD  amLODipine (NORVASC) 10 MG tablet Take 1 tablet (10 mg total) by mouth daily. 09/30/15 10/31/16  Juluis Mire, MD  dextromethorphan-guaiFENesin (MUCINEX DM) 30-600 MG 12hr tablet Take 1 tablet by mouth 2 (two) times daily as needed for cough. 09/29/15   Juluis Mire, MD  fluticasone (FLONASE) 50 MCG/ACT nasal spray Place 2 sprays into both nostrils daily. Patient taking differently: Place 2 sprays into both nostrils daily as needed for allergies.  09/16/14   Juluis Mire, MD  metoprolol tartrate (LOPRESSOR) 25 MG tablet Take 1 tablet (25 mg total) by mouth 2 (two) times daily. 09/29/15 09/28/16  Juluis Mire, MD  mirtazapine (REMERON) 30 MG tablet Take 1 tablet (30 mg total) by mouth at bedtime. 09/29/15 09/28/16  Juluis Mire, MD  nystatin (MYCOSTATIN) 100000 UNIT/ML suspension Use four times daily until thrush resolves 10/03/15   Juluis Mire, MD   Meds Ordered and Administered this Visit   Medications  ipratropium-albuterol (DUONEB) 0.5-2.5 (3) MG/3ML nebulizer solution 3 mL (3 mLs Nebulization Given 11/10/15 1453)    BP 152/93 mmHg  Pulse 155  Temp(Src) 100.6 F (38.1 C) (Oral)  SpO2 93% No data found.   Physical Exam  Constitutional: She is oriented to person, place, and time. She appears well-developed and well-nourished. No distress.  HENT:  Bilateral  TMs with clear effusions. No erythema or drainage.  Neck: Neck supple.  Cardiovascular: Regular rhythm and normal heart sounds.   No murmur heard. Tachycardic  Pulmonary/Chest: Effort normal. No respiratory distress. She has wheezes (primarily in left lung). She has no rales.  She does occasionally appear mildly short of breath  Neurological: She is alert and oriented to person, place, and time.    ED Course  Procedures  (including critical care time) ED ECG REPORT   Date: 11/10/2015  Rate: 135  Rhythm: sinus tachycardia  QRS Axis: normal  Intervals: QT prolonged  ST/T Wave abnormalities: normal  Conduction Disutrbances:none  Narrative Interpretation: sinus tachycardia with prolonged QTc  Old EKG Reviewed: unchanged  I have personally reviewed the EKG tracing and agree with the computerized printout as noted.  Labs Review Labs Reviewed - No data to display  Imaging Review Dg Chest 2 View  11/10/2015  CLINICAL DATA:  54 year old with current history of COPD, presenting with a 3 day history of cough, fever, shortness of breath, chest congestion and sinonasal congestion. EXAM: CHEST  2 VIEW COMPARISON:  09/21/2015 and earlier, including CTA chest 09/20/2015. FINDINGS: Cardiomediastinal silhouette unremarkable, unchanged. Interval improvement in the nodular interstitial opacities throughout both lungs since the examinations in early November, 2016, though basilar predominant micronodular disease persists. Focal patchy opacities in the right middle lobe and the left lower lobe. Mild central peribronchial thickening, unchanged. Stable hyperinflation. No pleural effusions. No pneumothorax. Thoracic scoliosis convex right again noted. IMPRESSION: 1. Focal airspace opacities in the right middle lobe and left lower lobe likely indicating acute pneumonia superimposed upon # 2 below. 2. Improvement in the micronodular interstitial opacities throughout both lungs since November, 2016, though basilar predominant micronodular disease persists. Query chronic mycobacterium avium intracellulare infection. Electronically Signed   By: Evangeline Dakin M.D.   On: 11/10/2015 14:45      MDM   1. HCAP (healthcare-associated pneumonia)   2. Tachycardia    X-ray is concerning for an acute pneumonia superimposed on improving old pneumonia.  She is febrile and tachycardic, raising concern for early sepsis. Breathing treatment  given here. IV placed. We'll transfer to [facility] ED via EMS for additional workup and management.    Melony Overly, MD 11/10/15 [phone]

## 2015-11-10 NOTE — H&P (Signed)
Date: 11/11/2015               Patient Name:  Cynthia Martinez MRN: [id number]  DOB: 30-Sep-1961 Age / Sex: 54 y.o., female   PCP: Juluis Mire, MD         Medical Service: Internal Medicine Teaching Service         Attending Physician: Dr. Annia Belt, MD    First Contact: Dr. Lindon Romp Pager: [phone]  Second Contact: Dr. Osa Craver Pager: [phone]       After Hours (After 5p/  First Contact Pager: [phone]  weekends / holidays): Second Contact Pager: [phone]   Chief Complaint: Cough with productive sputum, SOB  History of Present Illness: Cynthia Martinez is a 54 year old female with PMH of COPD Stage 3 (PFTs 09/28/12: fev1 41%, ratio 42%), HTN, treated Hep C, and pernicious anemia who presents with 5 days history of worsening cough and SOB. She was admitted from 11/2-11/6 for COPD exacerbation in setting of Haemophilus influenza pneumonia. She was treated with 7 day course of Ceftin and discharged with home oxygen, using 2L nasal cannula. She says she was doing well afterwards, using home oxygen twice a day and albuterol every 4 hours. This past Monday, she began to have cough productive of yellow sputum, some shortness of breath, wheezing, and rhinorrhea. She also had intermittent headaches and ear aches with a "popping" sensation. She thought she was coming down with a cold and tried OTC Coricidin without much relief. She had her grandchild with her last week, who was reportedly sick. No other sick contacts. She went to urgent care today with a fever of 100.6 and CXR showed opacities in the right middle lobe and left lower lobe suggestive of acute pneumonia. She was then sent to the ED for further treatment and evaluation.  In the ED, she was tachycardic with pulse 119, BP 166/103, Temp 98.74F, RR 19, and SpO2 97% on 2LNC. She had an elevated white count of 12.4, lactic acid was 1.36. CT angio of chest was done to rule out PE. It showed pneumonia involving the  bilateral lower lobes, right middle lobe, and lesser degree inferior left upper lobe. No evidence for PE. Blood cultures were collected and she was started on IV Vancomycin and Aztreonam.   She denies any chills, chest pain, abdominal pain, N/V/D/C, change in vision, myalgias, or joint pain. She is a former smoker of 0.5 PPD for about 10 years and quit 6-7 years ago. She drinks an occasional glass of wine on holidays. She denies recreational drug use. She has allergies to penicillins and sulfa drugs, breaking out in hives.   Meds: Current Facility-Administered Medications  Medication Dose Route Frequency Provider Last Rate Last Dose  . 0.9 %  sodium chloride infusion  1,000 mL Intravenous Continuous Ezequiel Essex, MD 125 mL/hr at 11/10/15 1907 1,000 mL at 11/10/15 1907  . acetaminophen (TYLENOL) tablet 650 mg  650 mg Oral Q6H PRN Carly J Rivet, MD      . amLODipine (NORVASC) tablet 10 mg  10 mg Oral Daily Juliet Rude, MD   10 mg at 11/10/15 2120  . aztreonam (AZACTAM) 1 g in dextrose 5 % 50 mL IVPB  1 g Intravenous 3 times per day Reginia Naas, RPH      . dextromethorphan-guaiFENesin (MUCINEX DM) 30-600 MG per 12 hr tablet 1 tablet  1 tablet Oral BID PRN Juliet Rude, MD      .  enoxaparin (LOVENOX) injection 30 mg  30 mg Subcutaneous Q24H Annia Belt, MD   30 mg at 11/10/15 2120  . ipratropium-albuterol (DUONEB) 0.5-2.5 (3) MG/3ML nebulizer solution 3 mL  3 mL Nebulization Q4H PRN Carly J Rivet, MD      . metoprolol tartrate (LOPRESSOR) tablet 25 mg  25 mg Oral BID Juliet Rude, MD   25 mg at 11/10/15 2119  . mirtazapine (REMERON) tablet 30 mg  30 mg Oral QHS Juliet Rude, MD   30 mg at 11/10/15 2120  . sodium chloride 0.9 % injection 3 mL  3 mL Intravenous Q12H Juliet Rude, MD   3 mL at 11/10/15 2120  . vancomycin (VANCOCIN) IVPB 1000 mg/200 mL premix  1,000 mg Intravenous Q24H Reginia Naas, RPH        Allergies: Allergies as of 11/10/2015 - Review Complete  11/10/2015  Allergen Reaction Noted  . Hydrochlorothiazide Hives 12/19/2011  . Penicillins Hives 11/30/2011  . Sulfa antibiotics Hives 11/30/2011   Past Medical History  Diagnosis Date  . Anemia   . DUB (dysfunctional uterine bleeding)   . Hypertension   . Hep C w/o coma, chronic ([place])   . COPD (chronic obstructive pulmonary disease) ([place])   . Shortness of breath dyspnea   . Depression   . Anxiety    Past Surgical History  Procedure Laterality Date  . Polypectomy     Family History  Problem Relation Age of Onset  . Diabetes     Social History   Social History  . Marital Status: Single    Spouse Name: N/A  . Number of Children: N/A  . Years of Education: N/A   Occupational History  . Not on file.   Social History Main Topics  . Smoking status: Former Smoker -- 0.50 packs/day for 9 years    Types: Cigarettes    Quit date: 07/19/2009  . Smokeless tobacco: Never Used  . Alcohol Use: No     Comment: socially  . Drug Use: No  . Sexual Activity:    Partners: Male    Birth Control/ Protection: Condom   Other Topics Concern  . Not on file   Social History Narrative   Lives with fiancee in [place]   2 daughter in 14's and 64'3.   Worked as Secretary/administrator in past.             Review of Systems: Review of Systems  Constitutional: Positive for fever. Negative for chills and diaphoresis.  HENT: Positive for congestion and ear pain. Negative for ear discharge and nosebleeds.   Eyes: Negative for blurred vision and double vision.  Respiratory: Positive for cough, sputum production, shortness of breath and wheezing. Negative for hemoptysis.   Cardiovascular: Positive for palpitations. Negative for chest pain and leg swelling.  Gastrointestinal: Negative for nausea, vomiting, abdominal pain, diarrhea, constipation, blood in stool and melena.  Genitourinary: Negative for dysuria and hematuria.  Musculoskeletal: Negative for myalgias and joint pain.  Neurological: Positive  for headaches. Negative for dizziness, tingling, focal weakness and weakness.     Physical Exam: Blood pressure 171/94, pulse 113, temperature 98.6 F (37 C), temperature source Oral, resp. rate 18, height 5\' 3"  (1.6 m), weight 106 lb 9.6 oz (48.353 kg), SpO2 97 %. Physical Exam  Constitutional: She is oriented to person, place, and time. She appears well-developed. No distress.  Thin, pleasant lady, on 2L O2 via [place].  HENT:  Head: Normocephalic and atraumatic.  Bilateral effusions, no  drainage or erythema  Eyes: EOM are normal.  Neck: Neck supple.  Cardiovascular: Regular rhythm and intact distal pulses.  Tachycardia present.   No murmur heard. Pulmonary/Chest: Effort normal. No respiratory distress. She has no wheezes. She exhibits no tenderness.  Diminished breath sounds, minimal crackles at right middle lung field  Abdominal: Soft. Bowel sounds are normal. There is no tenderness.  Musculoskeletal: Normal range of motion. She exhibits no edema or tenderness.  Lymphadenopathy:    She has no cervical adenopathy.  Neurological: She is alert and oriented to person, place, and time.  Skin: Skin is warm. She is not diaphoretic.  Psychiatric: She has a normal mood and affect.     Lab results: Basic Metabolic Panel:  Recent Labs  11/10/15 1627  NA 139  K 4.1  CL 106  CO2 22  GLUCOSE 112*  BUN 10  CREATININE 0.74  CALCIUM 9.0   Liver Function Tests:  Recent Labs  11/10/15 1627  AST 14*  ALT 8*  ALKPHOS 66  BILITOT 0.8  PROT 7.2  ALBUMIN 3.5   No results for input(s): LIPASE, AMYLASE in the last 72 hours. No results for input(s): AMMONIA in the last 72 hours. CBC:  Recent Labs  11/10/15 1627  WBC 12.4*  NEUTROABS 10.2*  HGB 13.5  HCT 41.9  MCV 87.7  PLT 279   Cardiac Enzymes: No results for input(s): CKTOTAL, CKMB, CKMBINDEX, TROPONINI in the last 72 hours. BNP: No results for input(s): PROBNP in the last 72 hours. D-Dimer: No results for input(s):  DDIMER in the last 72 hours. CBG: No results for input(s): GLUCAP in the last 72 hours. Hemoglobin A1C: No results for input(s): HGBA1C in the last 72 hours. Fasting Lipid Panel: No results for input(s): CHOL, HDL, LDLCALC, TRIG, CHOLHDL, LDLDIRECT in the last 72 hours. Thyroid Function Tests: No results for input(s): TSH, T4TOTAL, FREET4, T3FREE, THYROIDAB in the last 72 hours. Anemia Panel: No results for input(s): VITAMINB12, FOLATE, FERRITIN, TIBC, IRON, RETICCTPCT in the last 72 hours. Coagulation: No results for input(s): LABPROT, INR in the last 72 hours. Urine Drug Screen: Drugs of Abuse  No results found for: LABOPIA, COCAINSCRNUR, LABBENZ, AMPHETMU, THCU, LABBARB  Alcohol Level: No results for input(s): ETH in the last 72 hours. Urinalysis:  Recent Labs  11/10/15 1755  COLORURINE YELLOW  LABSPEC 1.014  PHURINE 7.5  GLUCOSEU NEGATIVE  HGBUR NEGATIVE  BILIRUBINUR NEGATIVE  KETONESUR 40*  PROTEINUR NEGATIVE  NITRITE NEGATIVE  LEUKOCYTESUR NEGATIVE   Imaging results:  Dg Chest 2 View  11/10/2015  CLINICAL DATA:  54 year old with current history of COPD, presenting with a 3 day history of cough, fever, shortness of breath, chest congestion and sinonasal congestion. EXAM: CHEST  2 VIEW COMPARISON:  09/21/2015 and earlier, including CTA chest 09/20/2015. FINDINGS: Cardiomediastinal silhouette unremarkable, unchanged. Interval improvement in the nodular interstitial opacities throughout both lungs since the examinations in early November, 2016, though basilar predominant micronodular disease persists. Focal patchy opacities in the right middle lobe and the left lower lobe. Mild central peribronchial thickening, unchanged. Stable hyperinflation. No pleural effusions. No pneumothorax. Thoracic scoliosis convex right again noted. IMPRESSION: 1. Focal airspace opacities in the right middle lobe and left lower lobe likely indicating acute pneumonia superimposed upon # 2 below. 2.  Improvement in the micronodular interstitial opacities throughout both lungs since November, 2016, though basilar predominant micronodular disease persists. Query chronic mycobacterium avium intracellulare infection. Electronically Signed   By: Evangeline Dakin M.D.   On: 11/10/2015 14:45  Ct Angio Chest Pe W/cm &/or Wo Cm  11/10/2015  CLINICAL DATA:  Shortness of breath with wheezing and tachycardia. EXAM: CT ANGIOGRAPHY CHEST WITH CONTRAST TECHNIQUE: Multidetector CT imaging of the chest was performed using the standard protocol during bolus administration of intravenous contrast. Multiplanar CT image reconstructions and MIPs were obtained to evaluate the vascular anatomy. CONTRAST:  54mL OMNIPAQUE IOHEXOL 350 MG/ML SOLN COMPARISON:  Chest x-ray November 10, 2015 FINDINGS: There is no pulmonary embolus. No mediastinal or hilar lymphadenopathy is identified. There is no aortic aneurysm or dissection. The heart size is normal. There is no pericardial effusion. Images of the lungs demonstrate patchy airspace opacity and nodularity in bilateral lower lobes and to a lesser degree in the left upper lobe. There is airspace consolidation with air bronchograms in the right middle lobe. There is no pleural effusion. The visualized upper abdominal structures are unremarkable. No acute abnormalities identified within the visualized bones. Review of the MIP images confirms the above findings. IMPRESSION: Pneumonias involving the bilateral lower lobes, right middle lobe, to a lesser degree inferior left upper lobe. No pulmonary embolus. Electronically Signed   By: Abelardo Diesel M.D.   On: 11/10/2015 19:08    Other results: EKG: sinus tachycardia.  Assessment & Plan by Problem: Principal Problem:   HCAP (healthcare-associated pneumonia) Active Problems:   Hypertension   Chronic hepatitis C virus infection ([place])   COPD (chronic obstructive pulmonary disease) ([place])   Depression  Cynthia Martinez is a 54 year  old female with PMH of COPD Stage 3 (PFTs 09/28/12: fev1 41%, ratio 42%), HTN, treated Hep C, and pernicious anemia who presents with 5 days history of worsening cough and SOB.    HCAP: Patient recently admitted for H.flu PNA treated with 7 days Ceftin. Improved and was doing well in interval time period until 4 days ago. She had worsening cough productive of yellow sputum, mild SOB and wheezing, fever, and rhinorrhea. CT angio of chest was done to rule out PE. It showed pneumonia involving the bilateral lower lobes, right middle lobe, and lesser degree inferior left upper lobe. Will treat for HCAP and follow up on cultures and speciation to deescalate antibiotic regimen. She was started on Vancomycin and Aztreonam in the ED. She was able to ambulate on room air unassisted, maintaining O2 saturation above 97%. She is not in respiratory distress and without wheezing on exam, so doubt COPD exacerbation playing a role at this time. -Continue IV Vancomycin and IV Aztreonam -IV NS 125 mL/hr -f/u blood cultures, sputum culture -Mucinex DM -BMP, CBC -f/u influenza panel  HTN: Elevated in the ED, did not have her home medications today as she usually takes at night -Continue home Amlodipine 10 mg po daiy, Lopressor 25 mg BID  COPD: -Duoneb q4h prn  Depression: -Continue mirtazapine 30 mg po qhs  Diet: Heart  DVT ppx: Lovenox  Code: Full   Dispo: Disposition is deferred at this time, awaiting improvement of current medical problems.   The patient does have a current PCP (Juluis Mire, MD) and does need an [facility] hospital follow-up appointment after discharge.  The patient does not have transportation limitations that hinder transportation to clinic appointments.  Signed: Zada Finders, MD 11/11/2015, 1:07 AM

## 2015-11-10 NOTE — ED Provider Notes (Signed)
CSN: [id number]     Arrival date & time 11/10/15  1551 History   First MD Initiated Contact with Patient 11/10/15 1559     Chief Complaint  Patient presents with  . Pneumonia     (Consider location/radiation/quality/duration/timing/severity/associated sxs/prior Treatment) HPI Comments: Patient presents with three-day history of cough, congestion, runny nose, intermittent headaches and body aches. She's been taking Coricidin over-the-counter without help. She's also had productive cough of yellow phlegm. Fever today to 100.6. Sent from urgent care with tachycardia and new-appearing pneumonia on chest x-ray. History of COPD on 2 L of home oxygen. She has not had increase it. Denies any chest pain, abdominal pain, nausea vomiting. No leg pain or leg swelling. She denies any sick contacts. She was hospitalized in the hospital about 6 weeks ago for pneumonia as well.  Patient is a 54 y.o. female presenting with pneumonia. The history is provided by the patient.  Pneumonia Associated symptoms include shortness of breath. Pertinent negatives include no chest pain, no abdominal pain and no headaches.    Past Medical History  Diagnosis Date  . Anemia   . DUB (dysfunctional uterine bleeding)   . Hypertension   . Hep C w/o coma, chronic ([place])   . COPD (chronic obstructive pulmonary disease) ([place])   . Shortness of breath dyspnea   . Depression   . Anxiety    Past Surgical History  Procedure Laterality Date  . Polypectomy     Family History  Problem Relation Age of Onset  . Diabetes     Social History  Substance Use Topics  . Smoking status: Former Smoker -- 0.50 packs/day for 9 years    Types: Cigarettes    Quit date: 07/19/2009  . Smokeless tobacco: Never Used  . Alcohol Use: No     Comment: socially   OB History    No data available     Review of Systems  Constitutional: Positive for fever, activity change, appetite change and fatigue.  HENT: Positive for congestion and  rhinorrhea.   Respiratory: Positive for cough and shortness of breath. Negative for chest tightness.   Cardiovascular: Negative for chest pain.  Gastrointestinal: Negative for nausea, vomiting and abdominal pain.  Genitourinary: Negative for dysuria, hematuria, vaginal bleeding and vaginal discharge.  Musculoskeletal: Positive for myalgias and arthralgias. Negative for neck pain and neck stiffness.  Skin: Negative for rash.  Neurological: Positive for weakness. Negative for dizziness and headaches.  A complete 10 system review of systems was obtained and all systems are negative except as noted in the HPI and PMH.      Allergies  Hydrochlorothiazide; Penicillins; and Sulfa antibiotics  Home Medications   Prior to Admission medications   Medication Sig Start Date End Date Taking? Authorizing Provider  albuterol (VENTOLIN HFA) 108 (90 BASE) MCG/ACT inhaler Inhale 2 puffs into the lungs every 4 (four) hours as needed for wheezing. 09/23/15 12/13/17 Yes Tasrif Ahmed, MD  amLODipine (NORVASC) 10 MG tablet Take 1 tablet (10 mg total) by mouth daily. 09/30/15 10/31/16 Yes Marjan Rabbani, MD  metoprolol tartrate (LOPRESSOR) 25 MG tablet Take 1 tablet (25 mg total) by mouth 2 (two) times daily. 09/29/15 09/28/16 Yes Marjan Rabbani, MD  mirtazapine (REMERON) 30 MG tablet Take 1 tablet (30 mg total) by mouth at bedtime. 09/29/15 09/28/16 Yes Marjan Rabbani, MD  dextromethorphan-guaiFENesin (MUCINEX DM) 30-600 MG 12hr tablet Take 1 tablet by mouth 2 (two) times daily as needed for cough. Patient not taking: Reported on 11/10/2015 09/29/15  Juluis Mire, MD  fluticasone (FLONASE) 50 MCG/ACT nasal spray Place 2 sprays into both nostrils daily. Patient not taking: Reported on 11/10/2015 09/16/14   Juluis Mire, MD  nystatin (MYCOSTATIN) 100000 UNIT/ML suspension Use four times daily until thrush resolves Patient not taking: Reported on 11/10/2015 10/03/15   Marjan Rabbani, MD   BP 171/94 mmHg   Pulse 113  Temp(Src) 98.6 F (37 C) (Oral)  Resp 18  Ht 5\' 3"  (1.6 m)  Wt 106 lb 9.6 oz (48.353 kg)  BMI 18.89 kg/m2  SpO2 97% Physical Exam  Constitutional: She is oriented to person, place, and time. She appears well-developed and well-nourished. No distress.  Speaking in full sentences  HENT:  Head: Normocephalic and atraumatic.  Mouth/Throat: Oropharynx is clear and moist. No oropharyngeal exudate.  Serous fluid behind each TM  Eyes: Conjunctivae and EOM are normal. Pupils are equal, round, and reactive to light.  Neck: Normal range of motion. Neck supple.  No meningismus.  Cardiovascular: Normal rate, normal heart sounds and intact distal pulses.   No murmur heard. Tachycardic to 130s  Pulmonary/Chest: Effort normal and breath sounds normal. No respiratory distress. She has no wheezes.  Abdominal: Soft. There is no tenderness. There is no rebound and no guarding.  Musculoskeletal: Normal range of motion. She exhibits no edema or tenderness.  Neurological: She is alert and oriented to person, place, and time. No cranial nerve deficit. She exhibits normal muscle tone. Coordination normal.  No ataxia on finger to nose bilaterally. No pronator drift. 5/5 strength throughout. CN 2-12 intact.Equal grip strength. Sensation intact.   Skin: Skin is warm.  Psychiatric: She has a normal mood and affect. Her behavior is normal.  Nursing note and vitals reviewed.   ED Course  Procedures (including critical care time) Labs Review Labs Reviewed  COMPREHENSIVE METABOLIC PANEL - Abnormal; Notable for the following:    Glucose, Bld 112 (*)    AST 14 (*)    ALT 8 (*)    All other components within normal limits  CBC WITH DIFFERENTIAL/PLATELET - Abnormal; Notable for the following:    WBC 12.4 (*)    RDW 15.6 (*)    Neutro Abs 10.2 (*)    All other components within normal limits  URINALYSIS, ROUTINE W REFLEX MICROSCOPIC (NOT AT [facility]) - Abnormal; Notable for the following:     APPearance HAZY (*)    Ketones, ur 40 (*)    All other components within normal limits  CULTURE, BLOOD (ROUTINE X 2)  CULTURE, BLOOD (ROUTINE X 2)  URINE CULTURE  CULTURE, RESPIRATORY (NON-EXPECTORATED)  INFLUENZA PANEL BY PCR (TYPE A & B, H1N1)  CBC  BASIC METABOLIC PANEL  I-STAT CG4 LACTIC ACID, ED    Imaging Review Dg Chest 2 View  11/10/2015  CLINICAL DATA:  54 year old with current history of COPD, presenting with a 3 day history of cough, fever, shortness of breath, chest congestion and sinonasal congestion. EXAM: CHEST  2 VIEW COMPARISON:  09/21/2015 and earlier, including CTA chest 09/20/2015. FINDINGS: Cardiomediastinal silhouette unremarkable, unchanged. Interval improvement in the nodular interstitial opacities throughout both lungs since the examinations in early November, 2016, though basilar predominant micronodular disease persists. Focal patchy opacities in the right middle lobe and the left lower lobe. Mild central peribronchial thickening, unchanged. Stable hyperinflation. No pleural effusions. No pneumothorax. Thoracic scoliosis convex right again noted. IMPRESSION: 1. Focal airspace opacities in the right middle lobe and left lower lobe likely indicating acute pneumonia superimposed upon # 2 below. 2.  Improvement in the micronodular interstitial opacities throughout both lungs since November, 2016, though basilar predominant micronodular disease persists. Query chronic mycobacterium avium intracellulare infection. Electronically Signed   By: Evangeline Dakin M.D.   On: 11/10/2015 14:45   Ct Angio Chest Pe W/cm &/or Wo Cm  11/10/2015  CLINICAL DATA:  Shortness of breath with wheezing and tachycardia. EXAM: CT ANGIOGRAPHY CHEST WITH CONTRAST TECHNIQUE: Multidetector CT imaging of the chest was performed using the standard protocol during bolus administration of intravenous contrast. Multiplanar CT image reconstructions and MIPs were obtained to evaluate the vascular anatomy.  CONTRAST:  69mL OMNIPAQUE IOHEXOL 350 MG/ML SOLN COMPARISON:  Chest x-ray November 10, 2015 FINDINGS: There is no pulmonary embolus. No mediastinal or hilar lymphadenopathy is identified. There is no aortic aneurysm or dissection. The heart size is normal. There is no pericardial effusion. Images of the lungs demonstrate patchy airspace opacity and nodularity in bilateral lower lobes and to a lesser degree in the left upper lobe. There is airspace consolidation with air bronchograms in the right middle lobe. There is no pleural effusion. The visualized upper abdominal structures are unremarkable. No acute abnormalities identified within the visualized bones. Review of the MIP images confirms the above findings. IMPRESSION: Pneumonias involving the bilateral lower lobes, right middle lobe, to a lesser degree inferior left upper lobe. No pulmonary embolus. Electronically Signed   By: Abelardo Diesel M.D.   On: 11/10/2015 19:08   I have personally reviewed and evaluated these images and lab results as part of my medical decision-making.   EKG Interpretation   Date/Time:  Friday November 10 2015 15:59:27 EST Ventricular Rate:  125 PR Interval:    QRS Duration: 82 QT Interval:  305 QTC Calculation: 440 R Axis:   -126 Text Interpretation:  Sinus tachycardia Multiple ventricular premature  complexes Probable right ventricular hypertrophy Confirmed by Wyvonnia Dusky  MD,  Honour Schwieger [phone]) on 11/10/2015 6:31:57 PM      MDM   Final diagnoses:  HCAP (healthcare-associated pneumonia)  Sepsis, due to unspecified organism ([place])   History of COPD and home oxygen sent from urgent care with pneumonia and persistent tachycardiac. She is febrile. Code sepsis activated on arrival.  White count 12. Lactate is normal. Patient started on broad-spectrum antibiotics after cultures are obtained.  She remains tachycardic and tachypnea. She becomes dyspneic with ambulation  We'll check CT angiogram to rule out pulmonary  embolism.  CT is negative for PE but does show bilateral pneumonia right greater than left.  Patient remains tachypnea can tachycardic. She however appears to be improving. Oxygenation is stable on 2 L nasal cannula. With ongoing tachycardia and tachypnea, will admit for IV antibiotics. Discussed with internal medicine resident.   Ezequiel Essex, MD 11/11/15 Dyann Kief

## 2015-11-10 NOTE — Progress Notes (Signed)
ANTIBIOTIC CONSULT NOTE - INITIAL  Pharmacy Consult for Aztreonam and Vancomycin Indication: HAP  Allergies  Allergen Reactions  . Hydrochlorothiazide Hives  . Penicillins Hives    Tolerated ceftriaxone  . Sulfa Antibiotics Hives    Patient Measurements: TBW 46.4 kg in November 2016  Vital Signs: Temp: 98.8 F (37.1 C) (12/23 1600) Temp Source: Oral (12/23 1600) BP: 169/94 mmHg (12/23 1600) Pulse Rate: 131 (12/23 1600) Intake/Output from previous day:   Intake/Output from this shift:    Labs:  Recent Labs  11/10/15 1627  WBC 12.4*  HGB 13.5  PLT 279   CrCl cannot be calculated (Unknown ideal weight.). No results for input(s): VANCOTROUGH, VANCOPEAK, VANCORANDOM, GENTTROUGH, GENTPEAK, GENTRANDOM, TOBRATROUGH, TOBRAPEAK, TOBRARND, AMIKACINPEAK, AMIKACINTROU, AMIKACIN in the last 72 hours.   Microbiology: No results found for this or any previous visit (from the past 720 hour(s)).  Medical History: Past Medical History  Diagnosis Date  . Anemia   . DUB (dysfunctional uterine bleeding)   . Hypertension   . Hep C w/o coma, chronic ([place])   . COPD (chronic obstructive pulmonary disease) ([place])   . Shortness of breath dyspnea   . Depression   . Anxiety     Assessment: 54 yo F presents on 12/23 with PNA. Has 3 day history of cough, congestion, runny nose, and headaches. Went to urgent care showing new PNA. Was hospitalized ~6 weeks ago for PNA as well. Pharmacy consulted to dose abx for HAP. Currently afebrile, WBC elevated at 12.4. One time dose of vanc and aztreonam given in the ED. SCr stable, CrCl ~55-1ml/min.  Goal of Therapy:  Vancomycin trough level 15-20 mcg/ml  Resolution of infection  Plan:  Start aztreonam 1g IV Q8 tomorrow Start vancomycin 1g IV Q24 tomorrow Monitor clinical picture, renal function, VT prn F/U C&S, abx deescalation / LOT  Elenor Quinones, PharmD, BCPS Clinical Pharmacist Pager [phone] 11/10/2015 5:03 PM

## 2015-11-10 NOTE — Progress Notes (Signed)
NURSING PROGRESS NOTE  Cynthia Martinez [id number] Admission Data: 11/10/2015 9:32 PM Attending Provider: Annia Belt, MD [id number], Ricarda Frame, MD Code Status: full   Cynthia Martinez is a 54 y.o. female patient admitted from ED:  -No acute distress noted.  -No complaints of shortness of breath.  -No complaints of chest pain.   Cardiac Monitoring: Box # 18 in place. Cardiac monitor yields:normal sinus rhythm.  Blood pressure 171/94, pulse 113, temperature 98.6 F (37 C), temperature source Oral, resp. rate 18, height 5\' 3"  (1.6 m), weight 48.353 kg (106 lb 9.6 oz), SpO2 97 %.   IV Fluids:  IV in place, occlusive dsg intact without redness, IV cath antecubital right, condition patent and no redness normal saline., and Lt hand PIV, NSL  Allergies:  Hydrochlorothiazide; Penicillins; and Sulfa antibiotics  Past Medical History:   has a past medical history of Anemia; DUB (dysfunctional uterine bleeding); Hypertension; Hep C w/o coma, chronic ([facility]); COPD (chronic obstructive pulmonary disease) ([place]); Shortness of breath dyspnea; Depression; and Anxiety.  Past Surgical History:   has past surgical history that includes Polypectomy.  Social History:   reports that she quit smoking about 6 years ago. Her smoking use included Cigarettes. She has a 4.5 pack-year smoking history. She has never used smokeless tobacco. She reports that she does not drink alcohol or use illicit drugs.  Skin: Intact  Patient/Family orientated to room. Information packet given to patient/family. Admission inpatient armband information verified with patient/family to include name and date of birth and placed on patient arm. Side rails up x 2, fall assessment and education completed with patient/family. Patient/family able to verbalize understanding of risk associated with falls and verbalized understanding to call for assistance before getting out of bed. Call light within reach. Patient/family able to voice  and demonstrate understanding of unit orientation instructions.

## 2015-11-10 NOTE — Progress Notes (Signed)
Report received from [place], [place]. Pt is to be admitted in 5W09. Awaiting pt's arrival.

## 2015-11-10 NOTE — ED Notes (Signed)
Attempted to call report x 1  

## 2015-11-11 DIAGNOSIS — J189 Pneumonia, unspecified organism: Principal | ICD-10-CM

## 2015-11-11 DIAGNOSIS — Y95 Nosocomial condition: Secondary | ICD-10-CM

## 2015-11-11 LAB — BASIC METABOLIC PANEL
Anion gap: 9 (ref 5–15)
BUN: 6 mg/dL (ref 6–20)
CHLORIDE: 107 mmol/L (ref 101–111)
CO2: 23 mmol/L (ref 22–32)
CREATININE: 0.62 mg/dL (ref 0.44–1.00)
Calcium: 8.7 mg/dL — ABNORMAL LOW (ref 8.9–10.3)
GFR calc Af Amer: >60 mL/min (ref >60)
GFR calc non Af Amer: >60 mL/min (ref >60)
Glucose, Bld: 131 mg/dL — ABNORMAL HIGH (ref 65–99)
Potassium: 3.4 mmol/L — ABNORMAL LOW (ref 3.5–5.1)
SODIUM: 139 mmol/L (ref 135–145)

## 2015-11-11 LAB — CBC
HCT: 39.9 % (ref 36.0–46.0)
HEMOGLOBIN: 12.9 g/dL (ref 12.0–15.0)
MCH: 28.6 pg (ref 26.0–34.0)
MCHC: 32.3 g/dL (ref 30.0–36.0)
MCV: 88.5 fL (ref 78.0–100.0)
PLATELETS: 283 10*3/uL (ref 150–400)
RBC: 4.51 MIL/uL (ref 3.87–5.11)
RDW: 16 % — ABNORMAL HIGH (ref 11.5–15.5)
WBC: 9.7 10*3/uL (ref 4.0–10.5)

## 2015-11-11 LAB — MAGNESIUM: Magnesium: 1.9 mg/dL (ref 1.7–2.4)

## 2015-11-11 LAB — MRSA PCR SCREENING: MRSA BY PCR: NEGATIVE

## 2015-11-11 MED ORDER — LEVOFLOXACIN 750 MG PO TABS: 750.0000 mg | ORAL_TABLET | Freq: Every day | ORAL | Status: DC

## 2015-11-11 MED ORDER — MOMETASONE FURO-FORMOTEROL FUM 200-5 MCG/ACT IN AERO
2.0000 | INHALATION_SPRAY | Freq: Two times a day (BID) | RESPIRATORY_TRACT | Status: DC
  Filled 2015-11-11: qty 8.8

## 2015-11-11 MED ORDER — MOMETASONE FURO-FORMOTEROL FUM 200-5 MCG/ACT IN AERO: 2.0000 | INHALATION_SPRAY | Freq: Two times a day (BID) | RESPIRATORY_TRACT | Status: DC

## 2015-11-11 MED ORDER — POTASSIUM CHLORIDE CRYS ER 20 MEQ PO TBCR
40.0000 meq | EXTENDED_RELEASE_TABLET | Freq: Once | ORAL | Status: AC
  Administered 2015-11-11: 40 meq via ORAL
  Filled 2015-11-11: qty 2

## 2015-11-11 NOTE — Care Management Note (Signed)
Case Management Note  Patient Details  Name: Cynthia Martinez MRN: [id number] Date of Birth: 1961-05-21  Subjective/Objective:    Pneumonia             Action/Plan: NCM spoke to pt and states she has the [facility] card that assist with medications, but the pharmacy she uses is closed. Provided pt with a goodrx.com coupon for Levaquin for $11.00. Pt reports she has inhaler at home. Husband at home to assist with her care.   Expected Discharge Date:                  Expected Discharge Plan:  Home/Self Care  In-House Referral:  NA  Discharge planning Services  CM Consult, Medication Assistance  Post Acute Care Choice:  NA Choice offered to:  NA  DME Arranged:  N/A DME Agency:  NA  HH Arranged:  NA HH Agency:  NA  Status of Service:  Completed, signed off  Medicare Important Message Given:    Date Medicare IM Given:    Medicare IM give by:    Date Additional Medicare IM Given:    Additional Medicare Important Message give by:     If discussed at [place] of Stay Meetings, dates discussed:    Additional Comments:  Erenest Rasher, RN 11/11/2015, 11:23 AM

## 2015-11-11 NOTE — Discharge Summary (Signed)
Name: Cynthia Martinez MRN: [id number] DOB: [birth year] 54 y.o. PCP: Juluis Mire, MD  Date of Admission: 11/10/2015  3:51 PM Date of Discharge: 11/11/2015 Attending Physician: Annia Belt, MD  Discharge Diagnosis: 1. HCAP   Principal Problem:   HCAP (healthcare-associated pneumonia) Active Problems:   Hypertension   Chronic hepatitis C virus infection ([place])   COPD (chronic obstructive pulmonary disease) ([place])   Depression  Discharge Medications:   Medication List    TAKE these medications        albuterol 108 (90 BASE) MCG/ACT inhaler  Commonly known as:  VENTOLIN HFA  Inhale 2 puffs into the lungs every 4 (four) hours as needed for wheezing.     amLODipine 10 MG tablet  Commonly known as:  NORVASC  Take 1 tablet (10 mg total) by mouth daily.     dextromethorphan-guaiFENesin 30-600 MG 12hr tablet  Commonly known as:  MUCINEX DM  Take 1 tablet by mouth 2 (two) times daily as needed for cough.     fluticasone 50 MCG/ACT nasal spray  Commonly known as:  FLONASE  Place 2 sprays into both nostrils daily.     levofloxacin 750 MG tablet  Commonly known as:  LEVAQUIN  Take 1 tablet (750 mg total) by mouth daily.     metoprolol tartrate 25 MG tablet  Commonly known as:  LOPRESSOR  Take 1 tablet (25 mg total) by mouth 2 (two) times daily.     mirtazapine 30 MG tablet  Commonly known as:  REMERON  Take 1 tablet (30 mg total) by mouth at bedtime.     mometasone-formoterol 200-5 MCG/ACT Aero  Commonly known as:  DULERA  Inhale 2 puffs into the lungs 2 (two) times daily.     nystatin 100000 UNIT/ML suspension  Commonly known as:  MYCOSTATIN  Use four times daily until thrush resolves        Disposition and follow-up:   Cynthia Martinez was discharged from [facility] in Stable condition.  At the hospital follow up visit please address:  1.  COPD control and medication regimen Ruthe Mannan is new medication)  2.  Labs / imaging needed  at time of follow-up: CXR 4-6 weeks post discharge  3.  Pending labs/ test needing follow-up: blood cultures  Follow-up Appointments: Follow-up Information    Schedule an appointment as soon as possible for a visit with Juluis Mire, MD.   Specialty:  Internal Medicine   Why:  for hospital follow up in 1-2 weeks.    Contact information:   [place] [address] [phone]       Discharge Instructions: Discharge Instructions    Diet - low sodium heart healthy    Complete by:  As directed      Increase activity slowly    Complete by:  As directed            Consultations:    Procedures Performed:  Dg Chest 2 View  11/10/2015  CLINICAL DATA:  54 year old with current history of COPD, presenting with a 3 day history of cough, fever, shortness of breath, chest congestion and sinonasal congestion. EXAM: CHEST  2 VIEW COMPARISON:  09/21/2015 and earlier, including CTA chest 09/20/2015. FINDINGS: Cardiomediastinal silhouette unremarkable, unchanged. Interval improvement in the nodular interstitial opacities throughout both lungs since the examinations in early November, 2016, though basilar predominant micronodular disease persists. Focal patchy opacities in the right middle lobe and the left lower lobe. Mild central peribronchial thickening,  unchanged. Stable hyperinflation. No pleural effusions. No pneumothorax. Thoracic scoliosis convex right again noted. IMPRESSION: 1. Focal airspace opacities in the right middle lobe and left lower lobe likely indicating acute pneumonia superimposed upon # 2 below. 2. Improvement in the micronodular interstitial opacities throughout both lungs since November, 2016, though basilar predominant micronodular disease persists. Query chronic mycobacterium avium intracellulare infection. Electronically Signed   By: Evangeline Dakin M.D.   On: 11/10/2015 14:45   Ct Angio Chest Pe W/cm &/or Wo Cm  11/10/2015  CLINICAL DATA:  Shortness of  breath with wheezing and tachycardia. EXAM: CT ANGIOGRAPHY CHEST WITH CONTRAST TECHNIQUE: Multidetector CT imaging of the chest was performed using the standard protocol during bolus administration of intravenous contrast. Multiplanar CT image reconstructions and MIPs were obtained to evaluate the vascular anatomy. CONTRAST:  57mL OMNIPAQUE IOHEXOL 350 MG/ML SOLN COMPARISON:  Chest x-ray November 10, 2015 FINDINGS: There is no pulmonary embolus. No mediastinal or hilar lymphadenopathy is identified. There is no aortic aneurysm or dissection. The heart size is normal. There is no pericardial effusion. Images of the lungs demonstrate patchy airspace opacity and nodularity in bilateral lower lobes and to a lesser degree in the left upper lobe. There is airspace consolidation with air bronchograms in the right middle lobe. There is no pleural effusion. The visualized upper abdominal structures are unremarkable. No acute abnormalities identified within the visualized bones. Review of the MIP images confirms the above findings. IMPRESSION: Pneumonias involving the bilateral lower lobes, right middle lobe, to a lesser degree inferior left upper lobe. No pulmonary embolus. Electronically Signed   By: Abelardo Diesel M.D.   On: 11/10/2015 19:08   Mm Digital Screening Bilateral  10/20/2015  CLINICAL DATA:  Screening. EXAM: DIGITAL SCREENING BILATERAL MAMMOGRAM WITH CAD COMPARISON:  Previous exam(s). ACR Breast Density Category c: The breast tissue is heterogeneously dense, which may obscure small masses. FINDINGS: There are no findings suspicious for malignancy. Images were processed with CAD. IMPRESSION: No mammographic evidence of malignancy. A result letter of this screening mammogram will be mailed directly to the patient. RECOMMENDATION: Screening mammogram in one year. (Code:SM-B-01Y) BI-RADS CATEGORY  1: Negative. Electronically Signed   By: Abelardo Diesel M.D.   On: 10/20/2015 15:26    2D Echo:   Cardiac Cath:    Admission HPI: Cynthia Martinez is a 54 year old female with PMH of COPD Stage 3 (PFTs 09/28/12: fev1 41%, ratio 42%), HTN, treated Hep C, and pernicious anemia who presents with 5 days history of worsening cough and SOB. She was admitted from 11/2-11/6 for COPD exacerbation in setting of Haemophilus influenza pneumonia. She was treated with 7 day course of Ceftin and discharged with home oxygen, using 2L nasal cannula. She says she was doing well afterwards, using home oxygen twice a day and albuterol every 4 hours. This past Monday, she began to have cough productive of yellow sputum, some shortness of breath, wheezing, and rhinorrhea. She also had intermittent headaches and ear aches with a "popping" sensation. She thought she was coming down with a cold and tried OTC Coricidin without much relief. She had her grandchild with her last week, who was reportedly sick. No other sick contacts. She went to urgent care today with a fever of 100.6 and CXR showed opacities in the right middle lobe and left lower lobe suggestive of acute pneumonia. She was then sent to the ED for further treatment and evaluation.  In the ED, she was tachycardic with pulse 119, BP 166/103,  Temp 98.7F, RR 19, and SpO2 97% on 2LNC. She had an elevated white count of 12.4, lactic acid was 1.36. CT angio of chest was done to rule out PE. It showed pneumonia involving the bilateral lower lobes, right middle lobe, and lesser degree inferior left upper lobe. No evidence for PE. Blood cultures were collected and she was started on IV Vancomycin and Aztreonam.   She denies any chills, chest pain, abdominal pain, N/V/D/C, change in vision, myalgias, or joint pain. She is a former smoker of 0.5 PPD for about 10 years and quit 6-7 years ago. She drinks an occasional glass of wine on holidays. She denies recreational drug use. She has allergies to penicillins and sulfa drugs, breaking out in hives.   Hospital Course by problem  list: Principal Problem:   HCAP (healthcare-associated pneumonia) Active Problems:   Hypertension   Chronic hepatitis C virus infection ([place])   COPD (chronic obstructive pulmonary disease) ([place])   Depression   HCAP: Patient was started on Vancomycin and Aztreonam.  She improved overnight and felt ready to go home.  She was transitioned to Levaquin for 7 day total antibiotic course.  At follow up, please ensure patient has repeat CXR 4-6 weeks post-discharge to ensure resolution of infiltrates.  COPD: Patient was previously on Albuterol alone for COPD management.  She was discharged with additional [facility] for added control. At follow up, please evaluate patient's COPD control, medication adherence, and need for titration.  Discharge Vitals:   BP 121/84 mmHg  Pulse 92  Temp(Src) 98.2 F (36.8 C) (Oral)  Resp 18  Ht 5\' 3"  (1.6 m)  Wt 106 lb 9.6 oz (48.353 kg)  BMI 18.89 kg/m2  SpO2 99%  Discharge Labs:  Results for orders placed or performed during the hospital encounter of 11/10/15 (from the past 24 hour(s))  Comprehensive metabolic panel     Status: Abnormal   Collection Time: 11/10/15  4:27 PM  Result Value Ref Range   Sodium 139 135 - 145 mmol/L   Potassium 4.1 3.5 - 5.1 mmol/L   Chloride 106 101 - 111 mmol/L   CO2 22 22 - 32 mmol/L   Glucose, Bld 112 (H) 65 - 99 mg/dL   BUN 10 6 - 20 mg/dL   Creatinine, Ser 0.74 0.44 - 1.00 mg/dL   Calcium 9.0 8.9 - 10.3 mg/dL   Total Protein 7.2 6.5 - 8.1 g/dL   Albumin 3.5 3.5 - 5.0 g/dL   AST 14 (L) 15 - 41 U/L   ALT 8 (L) 14 - 54 U/L   Alkaline Phosphatase 66 38 - 126 U/L   Total Bilirubin 0.8 0.3 - 1.2 mg/dL   GFR calc non Af Amer >60 >60 mL/min   GFR calc Af Amer >60 >60 mL/min   Anion gap 11 5 - 15  CBC WITH DIFFERENTIAL     Status: Abnormal   Collection Time: 11/10/15  4:27 PM  Result Value Ref Range   WBC 12.4 (H) 4.0 - 10.5 K/uL   RBC 4.78 3.87 - 5.11 MIL/uL   Hemoglobin 13.5 12.0 - 15.0 g/dL   HCT 41.9 36.0 - 46.0 %    MCV 87.7 78.0 - 100.0 fL   MCH 28.2 26.0 - 34.0 pg   MCHC 32.2 30.0 - 36.0 g/dL   RDW 15.6 (H) 11.5 - 15.5 %   Platelets 279 150 - 400 K/uL   Neutrophils Relative % 82 %   Neutro Abs 10.2 (H) 1.7 - 7.7 K/uL  Lymphocytes Relative 11 %   Lymphs Abs 1.3 0.7 - 4.0 K/uL   Monocytes Relative 7 %   Monocytes Absolute 0.9 0.1 - 1.0 K/uL   Eosinophils Relative 0 %   Eosinophils Absolute 0.0 0.0 - 0.7 K/uL   Basophils Relative 0 %   Basophils Absolute 0.0 0.0 - 0.1 K/uL  I-Stat CG4 Lactic Acid, ED  (not at  [facility])     Status: None   Collection Time: 11/10/15  4:35 PM  Result Value Ref Range   Lactic Acid, Venous 1.36 0.5 - 2.0 mmol/L  Influenza panel by PCR (type A & B, H1N1)     Status: None   Collection Time: 11/10/15  5:09 PM  Result Value Ref Range   Influenza A By PCR NEGATIVE NEGATIVE   Influenza B By PCR NEGATIVE NEGATIVE   H1N1 flu by pcr NOT DETECTED NOT DETECTED  Urinalysis, Routine w reflex microscopic (not at [facility])     Status: Abnormal   Collection Time: 11/10/15  5:55 PM  Result Value Ref Range   Color, Urine YELLOW YELLOW   APPearance HAZY (A) CLEAR   Specific Gravity, Urine 1.014 1.005 - 1.030   pH 7.5 5.0 - 8.0   Glucose, UA NEGATIVE NEGATIVE mg/dL   Hgb urine dipstick NEGATIVE NEGATIVE   Bilirubin Urine NEGATIVE NEGATIVE   Ketones, ur 40 (A) NEGATIVE mg/dL   Protein, ur NEGATIVE NEGATIVE mg/dL   Nitrite NEGATIVE NEGATIVE   Leukocytes, UA NEGATIVE NEGATIVE  CBC     Status: Abnormal   Collection Time: 11/11/15  3:49 AM  Result Value Ref Range   WBC 9.7 4.0 - 10.5 K/uL   RBC 4.51 3.87 - 5.11 MIL/uL   Hemoglobin 12.9 12.0 - 15.0 g/dL   HCT 39.9 36.0 - 46.0 %   MCV 88.5 78.0 - 100.0 fL   MCH 28.6 26.0 - 34.0 pg   MCHC 32.3 30.0 - 36.0 g/dL   RDW 16.0 (H) 11.5 - 15.5 %   Platelets 283 150 - 400 K/uL  Basic metabolic panel     Status: Abnormal   Collection Time: 11/11/15  3:49 AM  Result Value Ref Range   Sodium 139 135 - 145 mmol/L   Potassium 3.4 (L) 3.5 - 5.1  mmol/L   Chloride 107 101 - 111 mmol/L   CO2 23 22 - 32 mmol/L   Glucose, Bld 131 (H) 65 - 99 mg/dL   BUN 6 6 - 20 mg/dL   Creatinine, Ser 0.62 0.44 - 1.00 mg/dL   Calcium 8.7 (L) 8.9 - 10.3 mg/dL   GFR calc non Af Amer >60 >60 mL/min   GFR calc Af Amer >60 >60 mL/min   Anion gap 9 5 - 15  MRSA PCR Screening     Status: None   Collection Time: 11/11/15  6:49 AM  Result Value Ref Range   MRSA by PCR NEGATIVE NEGATIVE  Magnesium     Status: None   Collection Time: 11/11/15  9:30 AM  Result Value Ref Range   Magnesium 1.9 1.7 - 2.4 mg/dL    Signed: Iline Oven, MD 11/11/2015, 11:29 AM    Services Ordered on Discharge: none Equipment Ordered on Discharge: none

## 2015-11-11 NOTE — Progress Notes (Signed)
Nsg Discharge Note  Admit Date:  11/10/2015 Discharge date: 11/11/2015   Cynthia Martinez to be D/C'd Home per MD order.  AVS completed.  Copy for chart, and copy for patient signed, and dated. Patient/caregiver able to verbalize understanding.  Discharge Medication:   Medication List    TAKE these medications        albuterol 108 (90 BASE) MCG/ACT inhaler  Commonly known as:  VENTOLIN HFA  Inhale 2 puffs into the lungs every 4 (four) hours as needed for wheezing.     amLODipine 10 MG tablet  Commonly known as:  NORVASC  Take 1 tablet (10 mg total) by mouth daily.     dextromethorphan-guaiFENesin 30-600 MG 12hr tablet  Commonly known as:  MUCINEX DM  Take 1 tablet by mouth 2 (two) times daily as needed for cough.     fluticasone 50 MCG/ACT nasal spray  Commonly known as:  FLONASE  Place 2 sprays into both nostrils daily.     levofloxacin 750 MG tablet  Commonly known as:  LEVAQUIN  Take 1 tablet (750 mg total) by mouth daily.     metoprolol tartrate 25 MG tablet  Commonly known as:  LOPRESSOR  Take 1 tablet (25 mg total) by mouth 2 (two) times daily.     mirtazapine 30 MG tablet  Commonly known as:  REMERON  Take 1 tablet (30 mg total) by mouth at bedtime.     mometasone-formoterol 200-5 MCG/ACT Aero  Commonly known as:  DULERA  Inhale 2 puffs into the lungs 2 (two) times daily.     nystatin 100000 UNIT/ML suspension  Commonly known as:  MYCOSTATIN  Use four times daily until thrush resolves        Discharge Assessment: Filed Vitals:   11/10/15 2250 11/11/15 0538  BP: 154/65 121/84  Pulse: 102 92  Temp:  98.2 F (36.8 C)  Resp:  18   Skin clean, dry and intact without evidence of skin break down, no evidence of skin tears noted. IV catheter discontinued intact. Site without signs and symptoms of complications - no redness or edema noted at insertion site, patient denies c/o pain - only slight tenderness at site.  Dressing with slight pressure  applied.  D/c Instructions-Education: Discharge instructions given to patient/family with verbalized understanding. D/c education completed with patient/family including follow up instructions, medication list, d/c activities limitations if indicated, with other d/c instructions as indicated by MD - patient able to verbalize understanding, all questions fully answered. Patient instructed to return to ED, call 911, or call MD for any changes in condition.  Patient escorted via [place], and D/C home via private auto.  Dayle Points, RN 11/11/2015 12:13 PM

## 2015-11-11 NOTE — Progress Notes (Signed)
Subjective:  Patient was seen and examined this morning. She presented yesterday with complaint of cough, shortness of breath for the past one week and found to have multilobar pneumonia. Patient states she is doing very well this morning. She denies increased shortness of breath, fever, chills, weakness, lightheadedness or chest pain. Patient would like to go home.   Objective: Vital signs in last 24 hours: Filed Vitals:   11/10/15 2001 11/10/15 2024 11/10/15 2250 11/11/15 0538  BP:  171/94 154/65 121/84  Pulse:  113 102 92  Temp: 99.9 F (37.7 C) 98.6 F (37 C)  98.2 F (36.8 C)  TempSrc: Oral Oral  Oral  Resp:  18  18  Height:  5\' 3"  (1.6 m)    Weight:  106 lb 9.6 oz (48.353 kg)    SpO2:  97%  99%   Weight change:   Intake/Output Summary (Last 24 hours) at 11/11/15 0931 Last data filed at 11/11/15 0616  Gross per 24 hour  Intake 1683.75 ml  Output      0 ml  Net 1683.75 ml   General: Vital signs reviewed.  Patient is well-developed and well-nourished, in no acute distress and cooperative with exam.  Cardiovascular: Tachycardic, regular rhythm, S1 normal, S2 normal, no murmurs, gallops, or rubs. Pulmonary/Chest: Decreased breath sounds, but clear to auscultation bilaterally, no wheezes, rales, or rhonchi. Abdominal: Soft, non-tender, non-distended Extremities: No lower extremity edema bilaterally Neurological: A&O x3 Psychiatric: Normal mood and affect. speech and behavior is normal.   Lab Results: Basic Metabolic Panel:  Recent Labs Lab 11/10/15 1627 11/11/15 0349  NA 139 139  K 4.1 3.4*  CL 106 107  CO2 22 23  GLUCOSE 112* 131*  BUN 10 6  CREATININE 0.74 0.62  CALCIUM 9.0 8.7*   Liver Function Tests:  Recent Labs Lab 11/10/15 1627  AST 14*  ALT 8*  ALKPHOS 66  BILITOT 0.8  PROT 7.2  ALBUMIN 3.5   CBC:  Recent Labs Lab 11/10/15 1627 11/11/15 0349  WBC 12.4* 9.7  NEUTROABS 10.2*  --   HGB 13.5 12.9  HCT 41.9 39.9  MCV 87.7 88.5  PLT  279 283   Urinalysis:  Recent Labs Lab 11/10/15 1755  COLORURINE YELLOW  LABSPEC 1.014  PHURINE 7.5  GLUCOSEU NEGATIVE  HGBUR NEGATIVE  BILIRUBINUR NEGATIVE  KETONESUR 40*  PROTEINUR NEGATIVE  NITRITE NEGATIVE  LEUKOCYTESUR NEGATIVE   Micro Results: Recent Results (from the past 240 hour(s))  MRSA PCR Screening     Status: None   Collection Time: 11/11/15  6:49 AM  Result Value Ref Range Status   MRSA by PCR NEGATIVE NEGATIVE Final    Comment:        The GeneXpert MRSA Assay (FDA approved for NASAL specimens only), is one component of a comprehensive MRSA colonization surveillance program. It is not intended to diagnose MRSA infection nor to guide or monitor treatment for MRSA infections.    Studies/Results: Dg Chest 2 View  11/10/2015  CLINICAL DATA:  54 year old with current history of COPD, presenting with a 3 day history of cough, fever, shortness of breath, chest congestion and sinonasal congestion. EXAM: CHEST  2 VIEW COMPARISON:  09/21/2015 and earlier, including CTA chest 09/20/2015. FINDINGS: Cardiomediastinal silhouette unremarkable, unchanged. Interval improvement in the nodular interstitial opacities throughout both lungs since the examinations in early November, 2016, though basilar predominant micronodular disease persists. Focal patchy opacities in the right middle lobe and the left lower lobe. Mild central peribronchial thickening, unchanged. Stable  hyperinflation. No pleural effusions. No pneumothorax. Thoracic scoliosis convex right again noted. IMPRESSION: 1. Focal airspace opacities in the right middle lobe and left lower lobe likely indicating acute pneumonia superimposed upon # 2 below. 2. Improvement in the micronodular interstitial opacities throughout both lungs since November, 2016, though basilar predominant micronodular disease persists. Query chronic mycobacterium avium intracellulare infection. Electronically Signed   By: Evangeline Dakin M.D.    On: 11/10/2015 14:45   Ct Angio Chest Pe W/cm &/or Wo Cm  11/10/2015  CLINICAL DATA:  Shortness of breath with wheezing and tachycardia. EXAM: CT ANGIOGRAPHY CHEST WITH CONTRAST TECHNIQUE: Multidetector CT imaging of the chest was performed using the standard protocol during bolus administration of intravenous contrast. Multiplanar CT image reconstructions and MIPs were obtained to evaluate the vascular anatomy. CONTRAST:  44mL OMNIPAQUE IOHEXOL 350 MG/ML SOLN COMPARISON:  Chest x-ray November 10, 2015 FINDINGS: There is no pulmonary embolus. No mediastinal or hilar lymphadenopathy is identified. There is no aortic aneurysm or dissection. The heart size is normal. There is no pericardial effusion. Images of the lungs demonstrate patchy airspace opacity and nodularity in bilateral lower lobes and to a lesser degree in the left upper lobe. There is airspace consolidation with air bronchograms in the right middle lobe. There is no pleural effusion. The visualized upper abdominal structures are unremarkable. No acute abnormalities identified within the visualized bones. Review of the MIP images confirms the above findings. IMPRESSION: Pneumonias involving the bilateral lower lobes, right middle lobe, to a lesser degree inferior left upper lobe. No pulmonary embolus. Electronically Signed   By: Abelardo Diesel M.D.   On: 11/10/2015 19:08   Medications:  I have reviewed the patient's current medications. Prior to Admission:  Prescriptions prior to admission  Medication Sig Dispense Refill Last Dose  . albuterol (VENTOLIN HFA) 108 (90 BASE) MCG/ACT inhaler Inhale 2 puffs into the lungs every 4 (four) hours as needed for wheezing. 6.7 g 6 unknown at unknown  . amLODipine (NORVASC) 10 MG tablet Take 1 tablet (10 mg total) by mouth daily. 90 tablet 3 11/09/2015 at Unknown time  . metoprolol tartrate (LOPRESSOR) 25 MG tablet Take 1 tablet (25 mg total) by mouth 2 (two) times daily. 60 tablet 3 11/09/2015 at 0800  .  mirtazapine (REMERON) 30 MG tablet Take 1 tablet (30 mg total) by mouth at bedtime. 30 tablet 2 11/09/2015 at Unknown time  . dextromethorphan-guaiFENesin (MUCINEX DM) 30-600 MG 12hr tablet Take 1 tablet by mouth 2 (two) times daily as needed for cough. (Patient not taking: Reported on 11/10/2015) 30 tablet 0 Not Taking at Unknown time  . fluticasone (FLONASE) 50 MCG/ACT nasal spray Place 2 sprays into both nostrils daily. (Patient not taking: Reported on 11/10/2015) 16 g 2 Not Taking at Unknown time  . nystatin (MYCOSTATIN) 100000 UNIT/ML suspension Use four times daily until thrush resolves (Patient not taking: Reported on 11/10/2015) 60 mL 0 Not Taking at Unknown time   Scheduled Meds: . amLODipine  10 mg Oral Daily  . aztreonam  1 g Intravenous 3 times per day  . enoxaparin (LOVENOX) injection  30 mg Subcutaneous Q24H  . metoprolol tartrate  25 mg Oral BID  . mirtazapine  30 mg Oral QHS  . mometasone-formoterol  2 puff Inhalation BID  . sodium chloride  3 mL Intravenous Q12H  . vancomycin  1,000 mg Intravenous Q24H   Continuous Infusions: . sodium chloride 1,000 mL (11/10/15 1907)   PRN Meds:.acetaminophen, dextromethorphan-guaiFENesin, ipratropium-albuterol Assessment/Plan: Principal Problem:  HCAP (healthcare-associated pneumonia) Active Problems:   Hypertension   Chronic hepatitis C virus infection ([place])   COPD (chronic obstructive pulmonary disease) ([place])   Depression  Cynthia Martinez is a 54 year old female with PMH of COPD Stage 3 (PFTs 09/28/12: fev1 41%, ratio 42%), HTN, treated Hep C, and pernicious anemia who presents with 5 days history of worsening cough and SOB.   HCAP: Patient recently admitted for H. Influenza pneumonia in November and now presenting with one week of shortness of breath and cough. CTA chest revealed pneumonia involving the bilateral lower lobes, right middle lobe, and lesser degree inferior left upper lobe. Patient was started on Vancomycin and  Aztreonam in the ED. She was able to ambulate on room air unassisted, maintaining O2 saturation above 97%. MRSA swab negative. Flu negative. Patient feels well this morning, stable on room air, no fever or shortness of breath. She would like to go home. We plan to discharge patient home today with continued antibiotics. -Discharge on Levaquin for 5 more days -Follow up blood cultures on follow up- no concern for bacteremia -Mucinex DM  HTN: 121/84 this morning. Patient is on amlodipine 10 mg daily and lopressor 25 mg BID at home.  -Continue home Amlodipine 10 mg po daiy -Continue home Lopressor 25 mg BID  COPD: History of COPD Stage 3  (PFTs 09/28/12: fev1 41%, ratio 42%). Patient is on albuterol prn at home. She needs a LABA + ICS, which I will add.  -Duoneb q4h prn -Continue home albuterol inhaler 2 puffs Q4H prn  -Add Dulera BID  Depression: Stable.  -Continue home mirtazapine 30 mg po QHS  Diet: Heart Healthy DVT ppx: Lovenox Code: Full  Dispo:  Anticipated discharge today.   The patient does have a current PCP (Juluis Mire, MD) and does need an [facility] hospital follow-up appointment after discharge.  The patient does not have transportation limitations that hinder transportation to clinic appointments.   LOS: 1 day   Osa Craver, DO PGY-2 Internal Medicine Resident Pager # [phone] 11/11/2015 9:31 AM

## 2015-11-11 NOTE — Discharge Instructions (Signed)
FOR YOUR PNEUMONIA: -TAKE LEVAQUIN 750 MG ONCE A DAY---STARTING TOMORROW -PLEASE FOLLOW UP WITH YOUR PRIMARY CARE DOCTOR IN THE NEXT 1-2 WEEKS  FOR YOUR LUNGS/COPD: -USE OXYGEN AS NEEDED -USE YOUR ALBUTEROL INHALER AS NEEDED -USE YOUR NEW INHALER DULERA 2 PUFFS TWICE A DAY EVERY DAY--THIS WILL BE AT YOUR PHARMACY

## 2015-11-12 LAB — URINE CULTURE

## 2015-11-15 LAB — CULTURE, BLOOD (ROUTINE X 2)
Culture: NO GROWTH
Culture: NO GROWTH

## 2015-11-27 ENCOUNTER — Ambulatory Visit: Payer: No Typology Code available for payment source | Admitting: Internal Medicine

## 2015-12-01 ENCOUNTER — Ambulatory Visit: Payer: No Typology Code available for payment source | Admitting: Internal Medicine

## 2015-12-01 IMAGING — RF DG ESOPHAGUS
7 series · 14 of 24 positions shown · non-contrast
Comparison: None.

CLINICAL DATA: Difficulty swallowing pills.

EXAM:
ESOPHOGRAM/BARIUM SWALLOW
TECHNIQUE: Single contrast examination was performed using  barium..
FLUOROSCOPY TIME:  Radiation Exposure Index (as provided by the
fluoroscopic device):
If the device does not provide the exposure index:
Fluoroscopy Time:  0 minutes and 42 seconds.
Number of Acquired Images:

[Series 1: fluoro_barium 2fps_bw · 0.18mm/px · 2 of 9 frames shown (1 of 7)]
[frame 1/9]
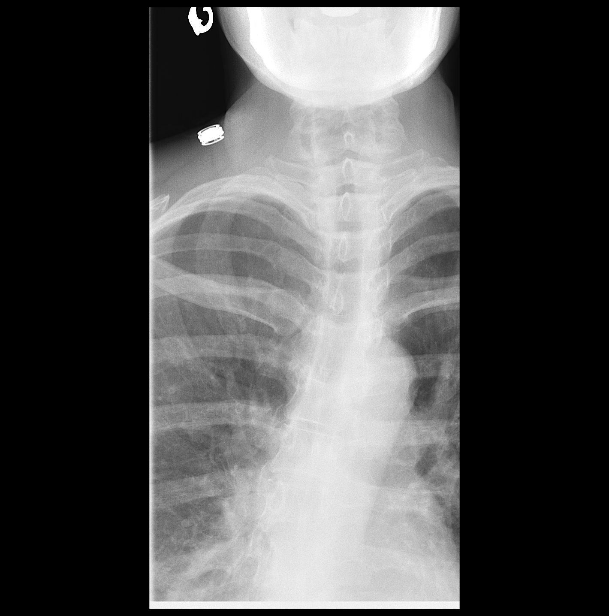
[frame 5/9]
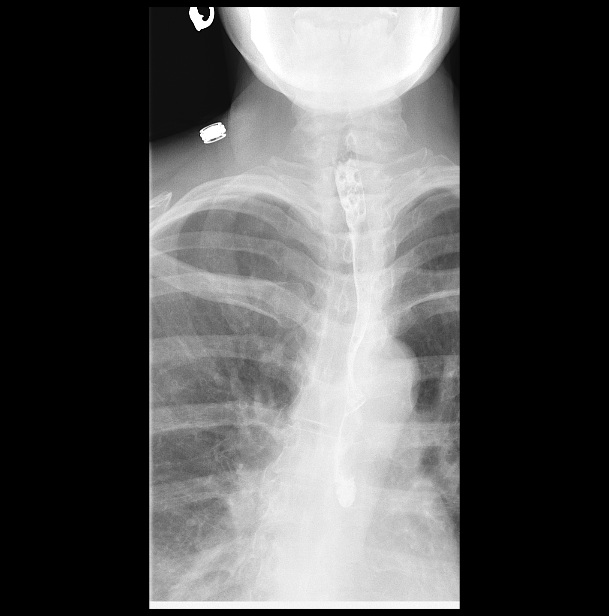

[Series 2: fluoro_barium 2fps_bw · 0.18mm/px · 1 of 6 frames shown (2 of 7)]
[frame 4/6]
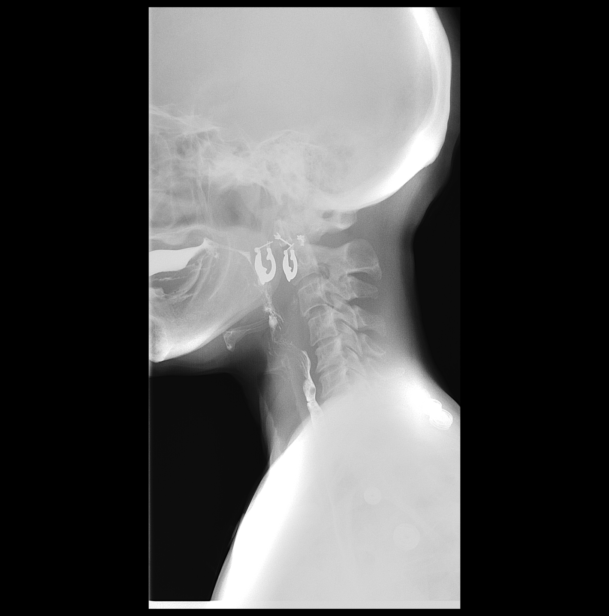

[Series 3: fluoro_barium 2fps_bw · 0.18mm/px · 3 of 20 frames shown (3 of 7)]
[frame 4/20]
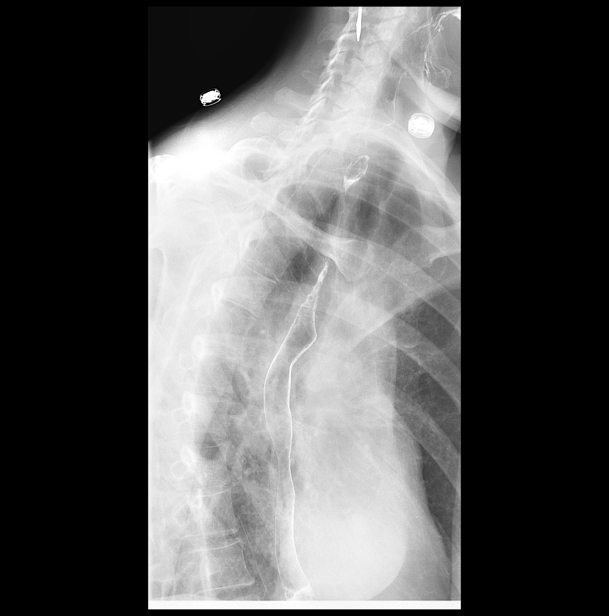
[frame 11/20]
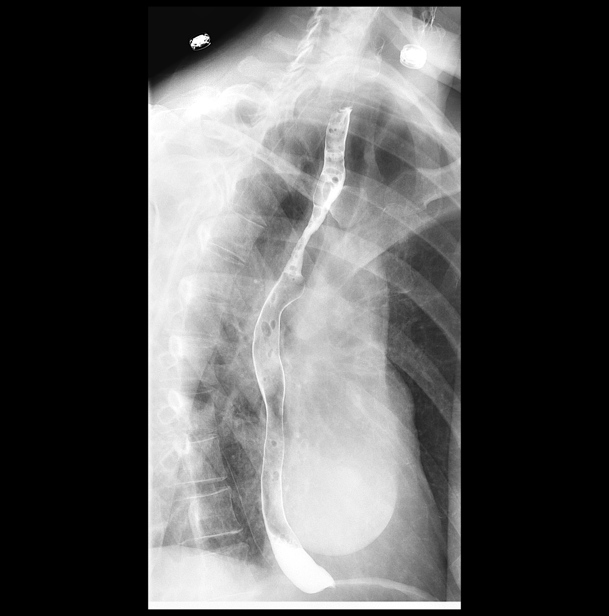
[frame 18/20]
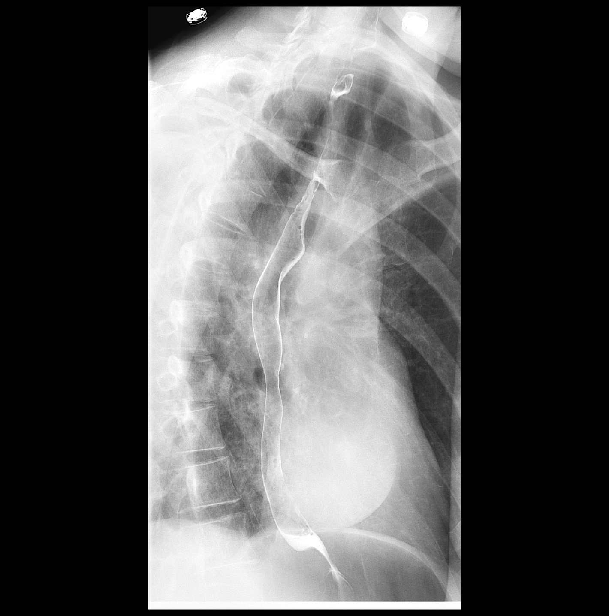

[Series 4: fluoro_barium 2fps_bw · 0.18mm/px · 2 of 6 frames shown (4 of 7)]
[frame 2/6]
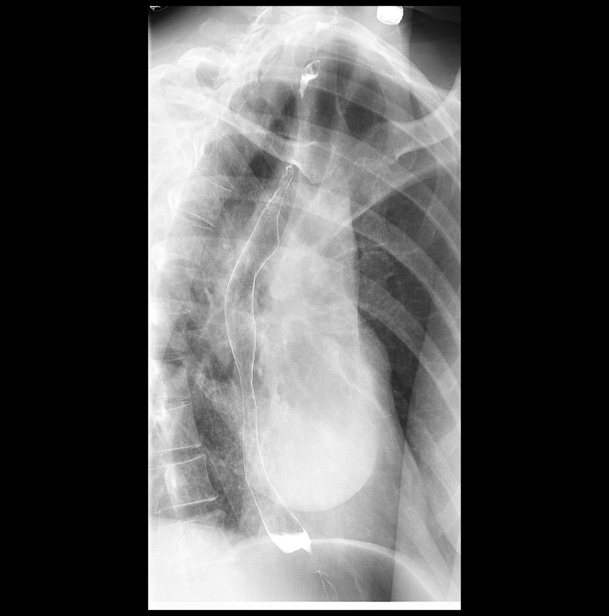
[frame 6/6]
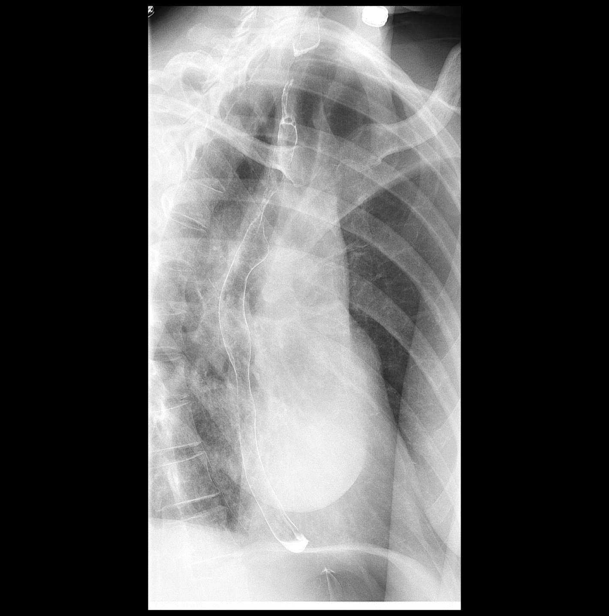

[Series 5: fluoro_barium 2fps_bw · 0.20mm/px · 2 of 20 frames shown (5 of 7)]
[frame 11/20]
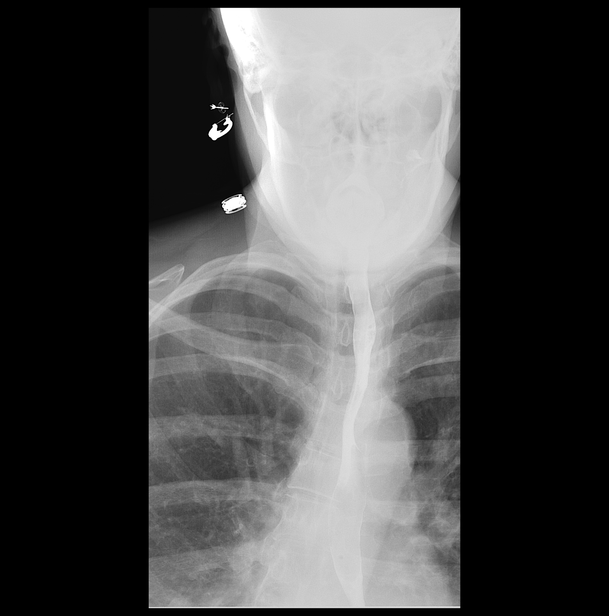
[frame 20/20]
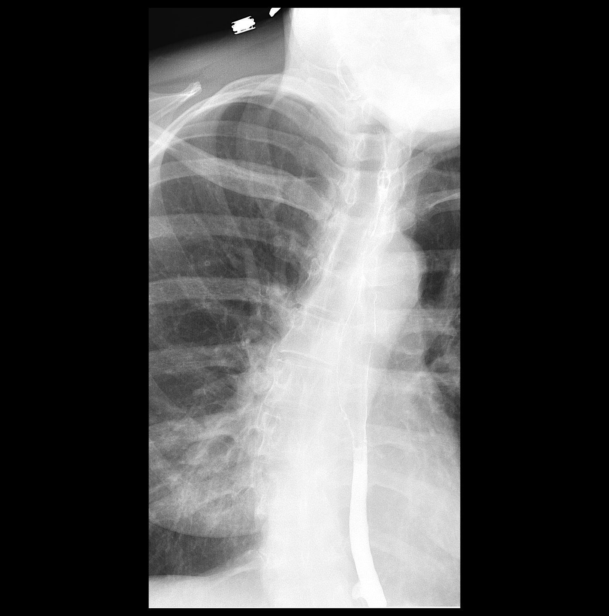

[Series 6: fluoro_barium 2fps_bw · 0.20mm/px · 2 of 20 frames shown (6 of 7)]
[frame 4/20]
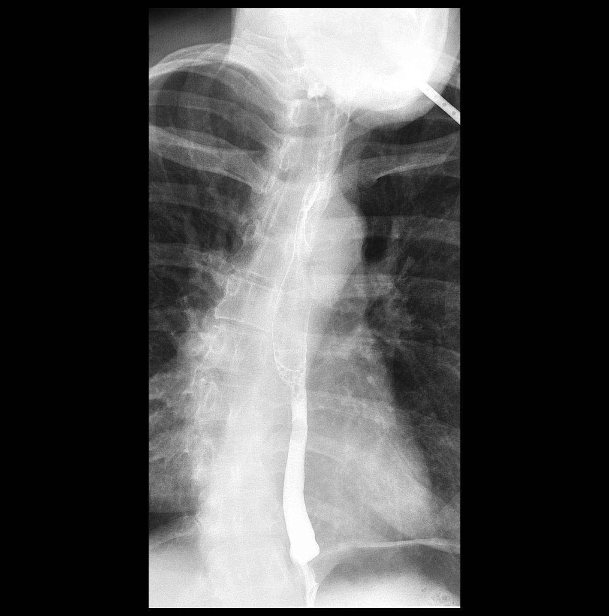
[frame 11/20]
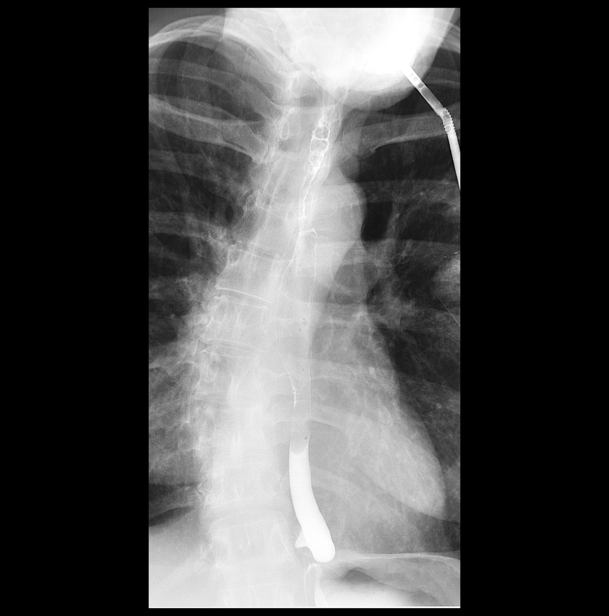

[Series 7: fluoro_barium 2fps_bw · 0.20mm/px · 2 of 13 frames shown (7 of 7)]
[frame 3/13]
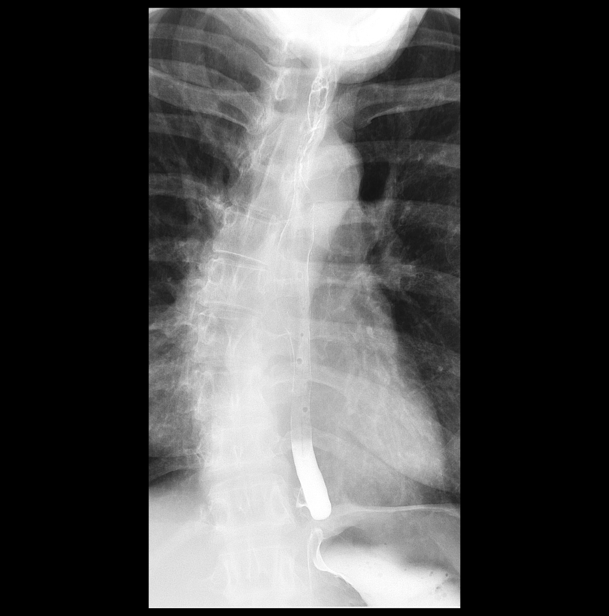
[frame 12/13]
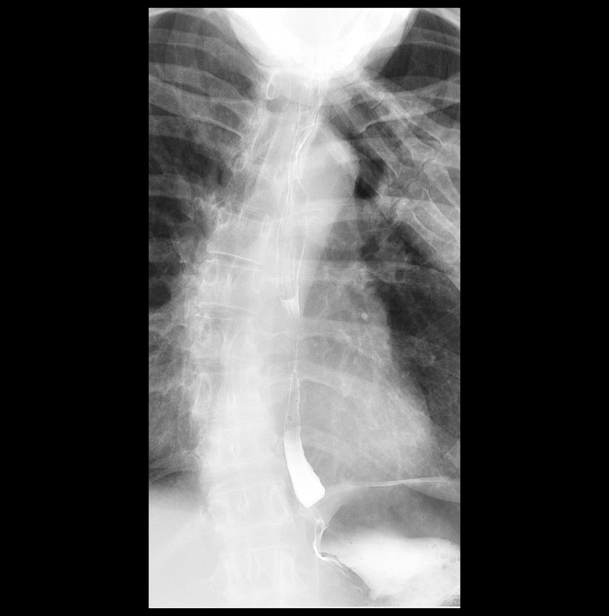

[14 of 24 positions shown; findings below may reference images not displayed]

FINDINGS: Study was limited by the patient's inability to swallow effervescent
crystals. Patient also would not swallow a pill as a pill getting
caught in her throat was the reason for her presentation to the
emergency room. The patient was unable to lie flat.

AP and lateral views of the hypopharynx while swallowing are
unremarkable although somewhat limited by small volume contrast
taken with each swallow.

Oblique views of the esophagus show a short segment of apparent
luminal stricturing in the proximal [DATE] of the esophagus, at about
the level of the transverse aorta. Despite repeated swallows, this
area of esophagus did not fully distend. Shouldering along this
narrowing is relatively smooth with no substantial mucosal
irregularity.
IMPRESSION: Very limited study as outlined above. There does appear to be a
short segment of gradual luminal narrowing in the upper esophagus.
Imaging features would be compatible with stricture and imaging
features suggest a benign etiology although neoplastic involvement
cannot be excluded. Consider upper endoscopy to further evaluate.

## 2015-12-01 IMAGING — CT CT ANGIO CHEST
2 of 10 series · 19 of 37 positions shown · IV contrast (omnipaque)
Comparison: 09/20/2015 radiographs

CLINICAL DATA: Tachycardia and hypoxia.  Flu like symptoms.

EXAM:
CT ANGIOGRAPHY CHEST WITH CONTRAST
TECHNIQUE: Multidetector CT imaging of the chest was performed using the
standard protocol during bolus administration of intravenous
contrast. Multiplanar CT image reconstructions and MIPs were
obtained to evaluate the vascular anatomy.
CONTRAST:  80mL OMNIPAQUE IOHEXOL 350 MG/ML SOLN

[Series 7: pe thins · axial · 0.62mm/px · z∈[-171,+46]mm · 15 of 497 slices shown (1 of 2)]
[im 32/497  lung]
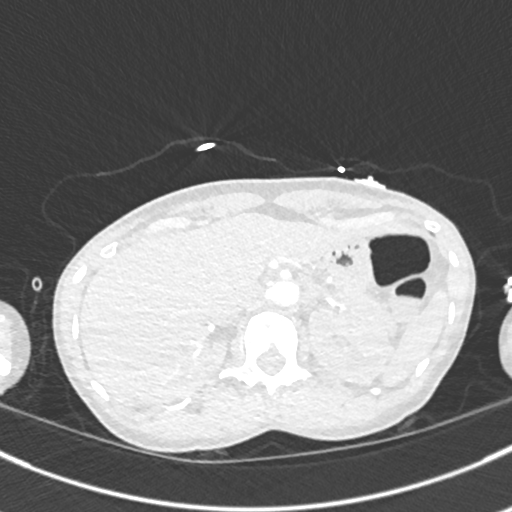
[im 63/497  mediastinal]
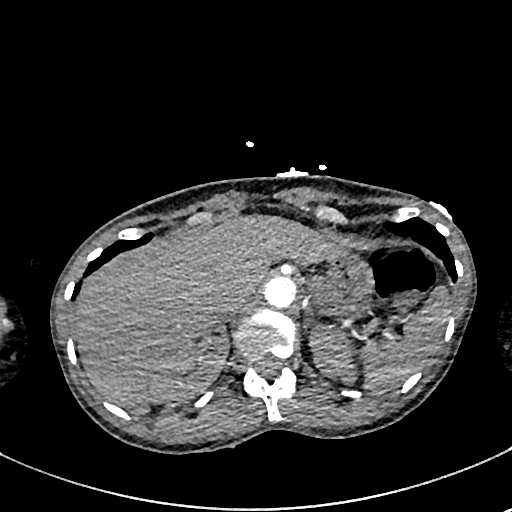
[im 94/497  lung]
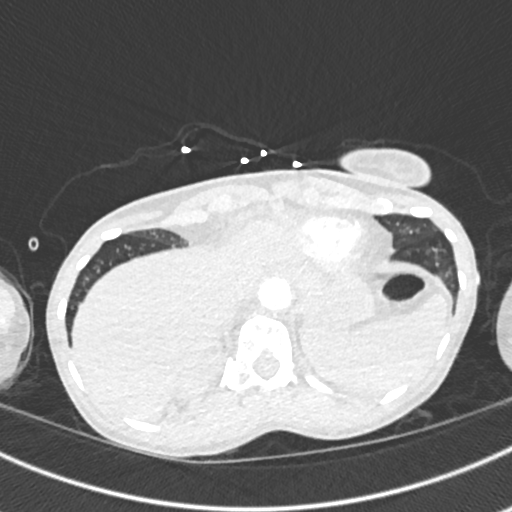
[im 125/497  mediastinal]
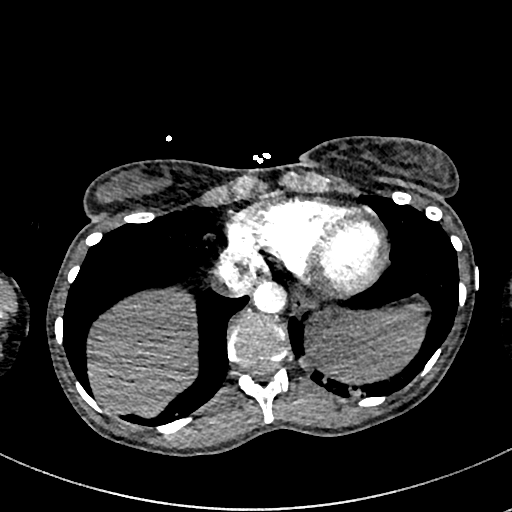
[im 156/497  lung]
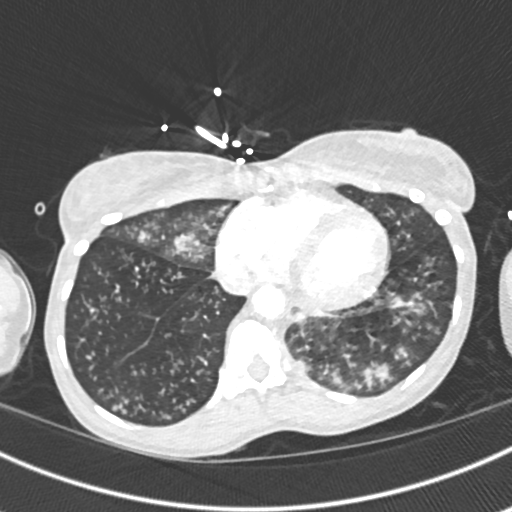
[im 187/497  mediastinal]
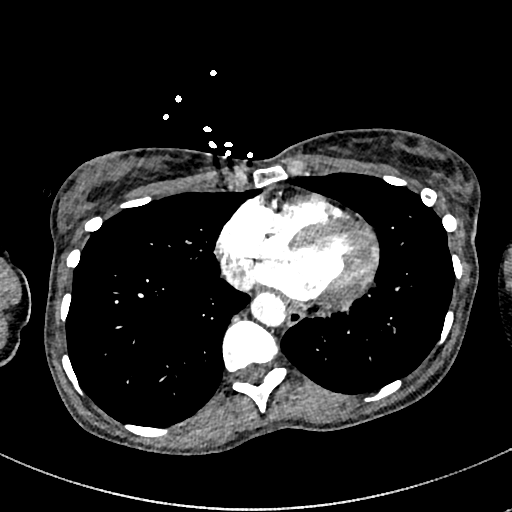
[im 218/497  lung]
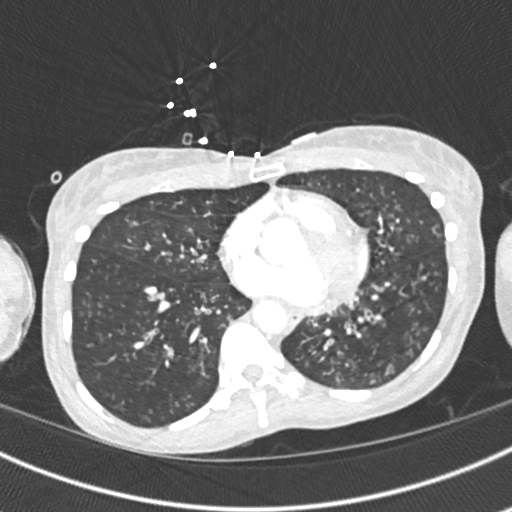
[im 249/497  mediastinal]
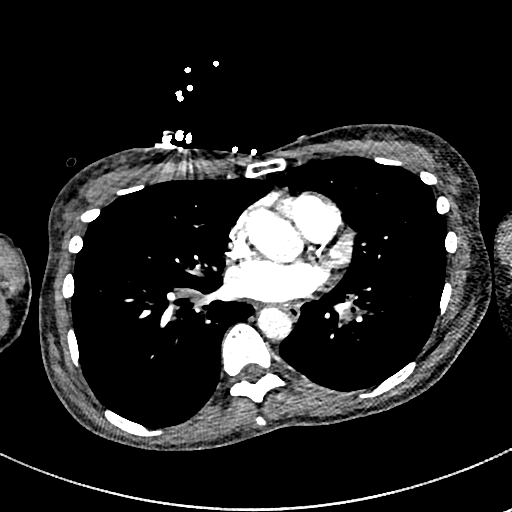
[im 280/497  lung]
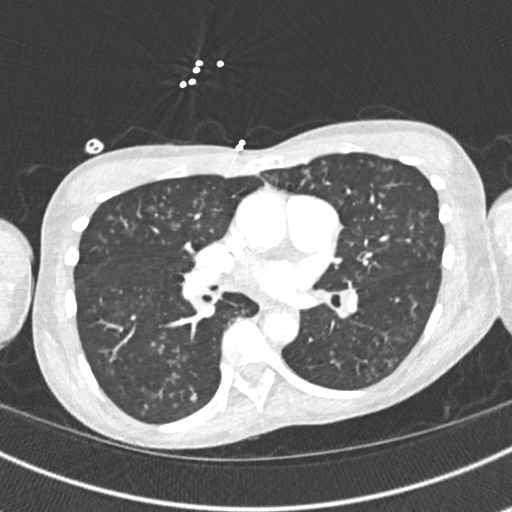
[im 311/497  mediastinal]
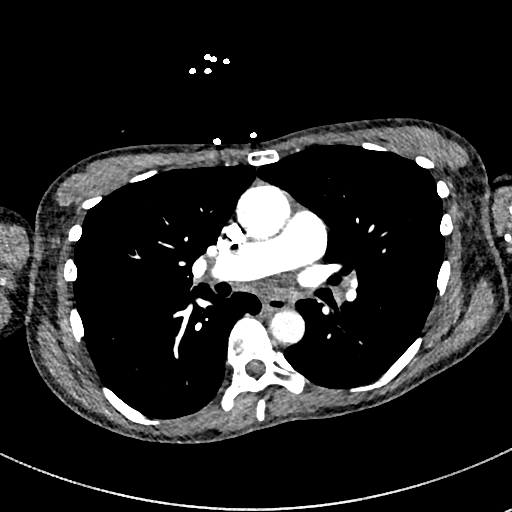
[im 342/497  lung]
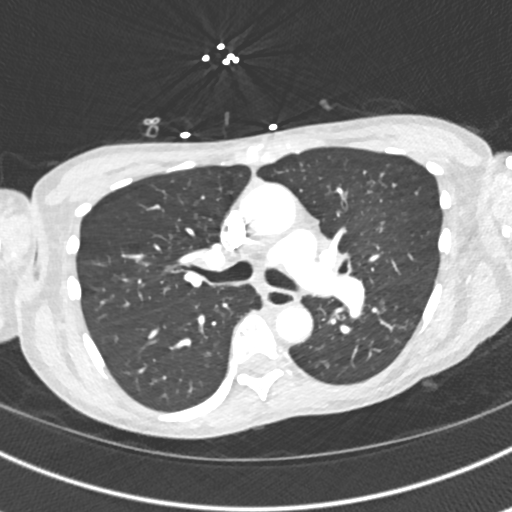
[im 373/497  mediastinal]
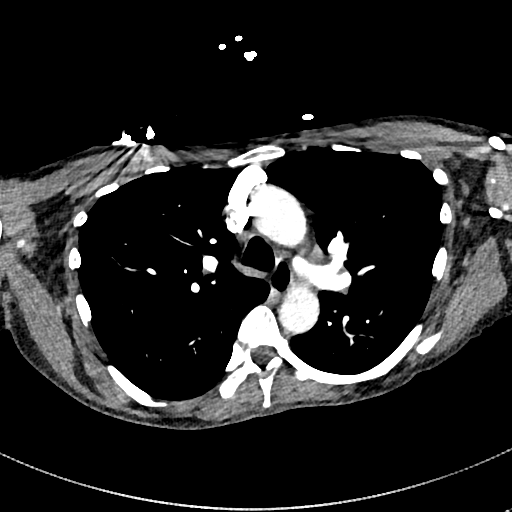
[im 404/497  lung]
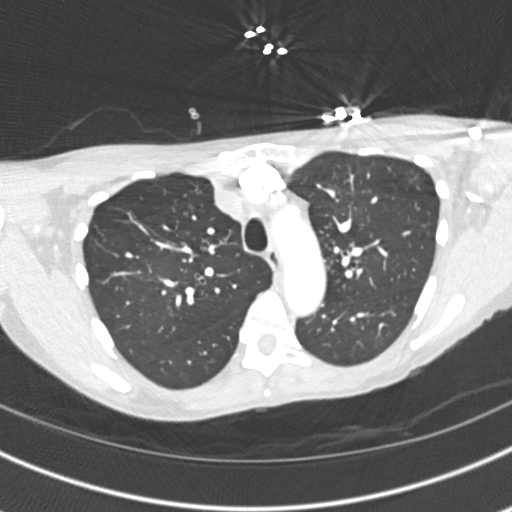
[im 435/497  mediastinal]
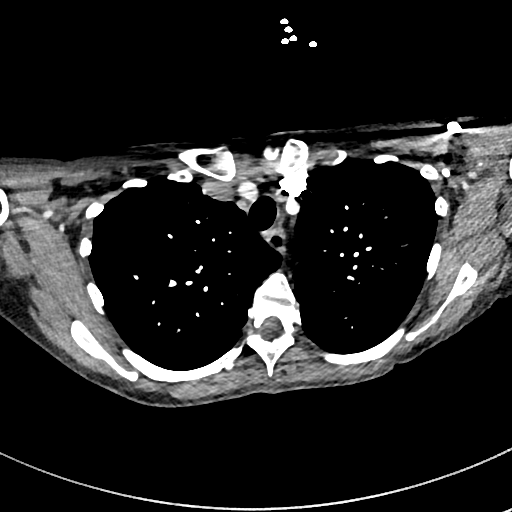
[im 466/497  lung]
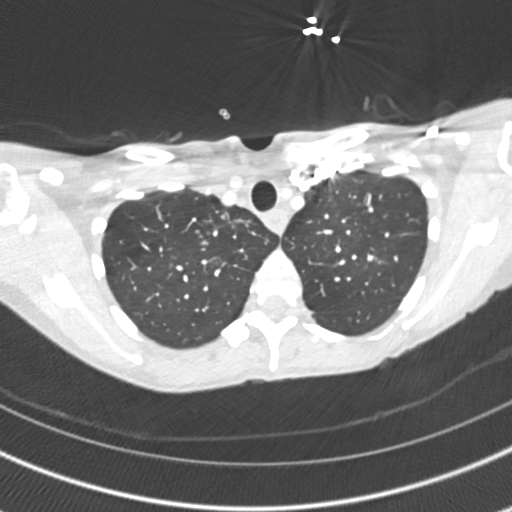

[Series 14: pe thins · axial · 0.62mm/px · z∈[+37,+88]mm · 4 of 171 slices shown (2 of 2)]
[im 35/171  lung]
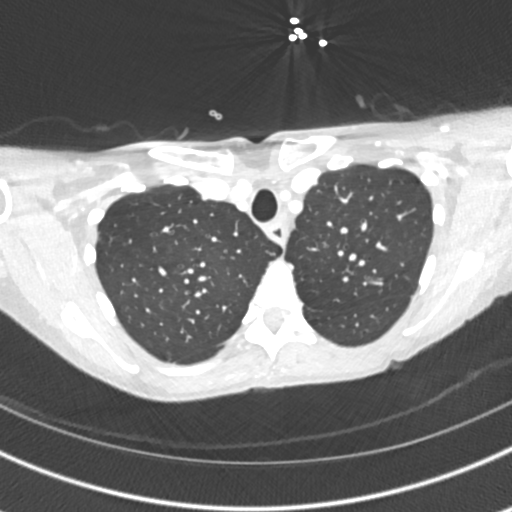
[im 69/171  lung]
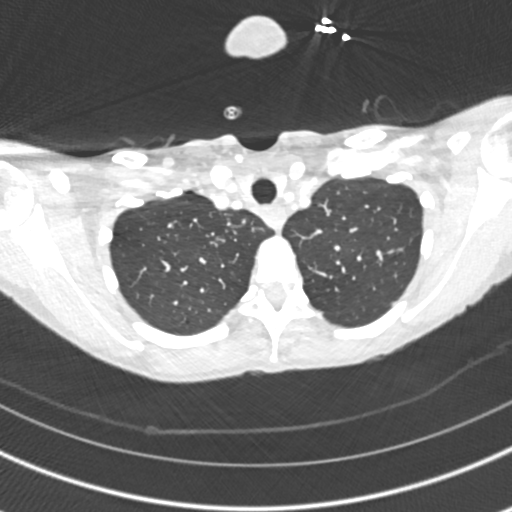
[im 103/171  lung]
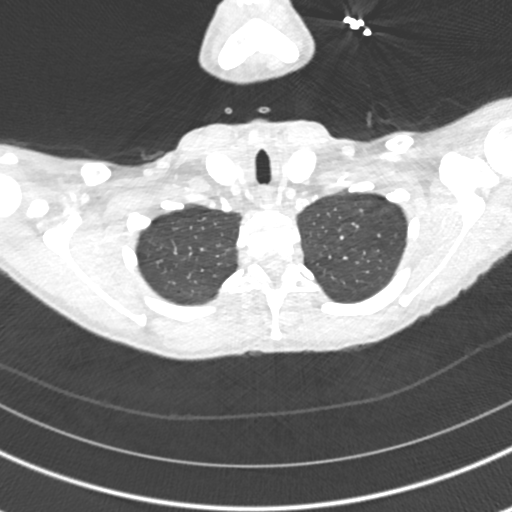
[im 137/171  lung]
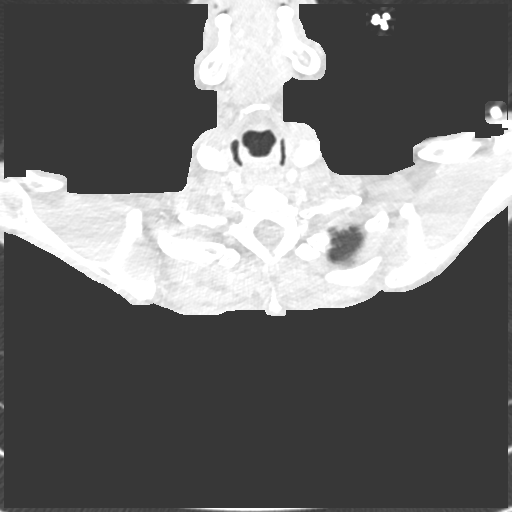

[19 of 37 positions shown; findings below may reference images not displayed]

FINDINGS: Cardiovascular: There is good opacification of the pulmonary
arteries. There is no pulmonary embolism. The thoracic aorta is
normal in caliber and intact.

Lungs: Fine interstitial nodularity is scattered throughout both
lungs, greatest in the right middle lobe base and left lower lobe
posterior base were there is some confluence. This most likely is an
infectious or inflammatory process, probably an atypical or viral
pneumonia given the clinical history.

Central airways: Patent. Mild bronchial wall thickening,
particularly in the lower lobes.

Effusions: None

Lymphadenopathy: None

Esophagus: Unremarkable

Upper abdomen: Unremarkable

Musculoskeletal: No significant abnormality

Review of the MIP images confirms the above findings.
IMPRESSION: 1. Negative for acute pulmonary embolism.
2. Diffuse nodularity and mild bronchial thickening, suspicious for
atypical or viral pneumonia in this clinical setting.

## 2015-12-08 ENCOUNTER — Encounter: Payer: Self-pay | Admitting: Gastroenterology

## 2015-12-12 ENCOUNTER — Ambulatory Visit: Payer: Self-pay | Admitting: Gastroenterology

## 2015-12-14 ENCOUNTER — Encounter: Payer: Self-pay | Admitting: Internal Medicine

## 2015-12-14 ENCOUNTER — Ambulatory Visit (HOSPITAL_COMMUNITY)
Admission: RE | Admit: 2015-12-14 | Discharge: 2015-12-14 | Disposition: A | Discharge status: Home / Self Care | Payer: No Typology Code available for payment source | Source: Ambulatory Visit | Attending: Student in an Organized Health Care Education/Training Program | Admitting: Student in an Organized Health Care Education/Training Program

## 2015-12-14 ENCOUNTER — Ambulatory Visit (INDEPENDENT_AMBULATORY_CARE_PROVIDER_SITE_OTHER): Payer: No Typology Code available for payment source | Admitting: Internal Medicine

## 2015-12-14 VITALS — BP 165/97 | HR 98 | Temp 98.2°F | Wt 112.8 lb

## 2015-12-14 DIAGNOSIS — Z7951 Long term (current) use of inhaled steroids: Secondary | ICD-10-CM

## 2015-12-14 DIAGNOSIS — Z87891 Personal history of nicotine dependence: Secondary | ICD-10-CM | POA: Insufficient documentation

## 2015-12-14 DIAGNOSIS — J441 Chronic obstructive pulmonary disease with (acute) exacerbation: Secondary | ICD-10-CM | POA: Insufficient documentation

## 2015-12-14 DIAGNOSIS — Z91199 Patient's noncompliance with other medical treatment and regimen due to unspecified reason: Secondary | ICD-10-CM | POA: Insufficient documentation

## 2015-12-14 DIAGNOSIS — Z9119 Patient's noncompliance with other medical treatment and regimen: Secondary | ICD-10-CM

## 2015-12-14 DIAGNOSIS — R918 Other nonspecific abnormal finding of lung field: Secondary | ICD-10-CM | POA: Insufficient documentation

## 2015-12-14 MED ORDER — TIOTROPIUM BROMIDE MONOHYDRATE 18 MCG IN CAPS: 18.0000 ug | ORAL_CAPSULE | Freq: Every day | RESPIRATORY_TRACT | Status: DC

## 2015-12-14 MED ORDER — PREDNISONE 20 MG PO TABS: 40.0000 mg | ORAL_TABLET | Freq: Every day | ORAL | Status: DC

## 2015-12-14 MED ORDER — DOXYCYCLINE HYCLATE 100 MG PO TABS: 100.0000 mg | ORAL_TABLET | Freq: Two times a day (BID) | ORAL | Status: DC

## 2015-12-14 MED ORDER — MOMETASONE FURO-FORMOTEROL FUM 200-5 MCG/ACT IN AERO: 2.0000 | INHALATION_SPRAY | Freq: Two times a day (BID) | RESPIRATORY_TRACT | Status: DC

## 2015-12-14 MED ORDER — TIOTROPIUM BROMIDE MONOHYDRATE 2.5 MCG/ACT IN AERS: 2.0000 | INHALATION_SPRAY | Freq: Every day | RESPIRATORY_TRACT | Status: DC

## 2015-12-14 NOTE — Assessment & Plan Note (Addendum)
HPI: Patient was hospitalized with a COPD exacerbation in Nov 2016, then again with a COPD exacerbation due to HCAP in Dec 2016.  She did not make a hospital follow up after that hospitalization.  She was discharged with a 7 total day course of levaquin at that time.  She reports that she responded well and fully recovered to her usual state of health.  She was not able to afford the dulea that was prescribed so she has been taking only her albuterol inhaler which helps some.  About 3 days ago she started coughing again, this time yellow sputum.  She does not feel as sick or quite as short of breath as she did in December but thought she should come and be evalauted sooner than later.  She denies any fever or chills. She denies sick contacts She reports that she has a follow up appointment in early feburary with pulmonology.  A: COPD exacerbation  P: I sent her for a CXR to rule out PNA,  She is afebrile here with normal vital signs.  I personally reviewed her CXR and appears overall improved from her last, the radiology read reports possbily early PNA in the RML. - I will treat her as a COPD exacerbation with 5 days of 40mg  of prednisone and 5 days of doxycycline. - She needs better compliance with her COPD controlling medications.  I have placed a referral to Clinical pharmacy, I have given her samples for Spirvia respimat and Dulera to last 1 month, I have also prescribed Spirvia handihaler and Dulera to the MAP program so hopefully after this month she will be able to afford them.

## 2015-12-14 NOTE — Assessment & Plan Note (Signed)
-   Trouble affording medications - Have sent in meds to Medication assistance program - At high risk for multiple hospital admissions due to lack of understanding of disease process and medication management - Placed referral to clinical pharmacy for follow up

## 2015-12-14 NOTE — Progress Notes (Signed)
[facility] Subjective:   Patient ID: Cynthia Martinez female   DOB: 1961-04-04 55 y.o.   MRN: [id number]  HPI: Cynthia Martinez is a 55 y.o. female with a PMH detailed below who presents for 3 day history of cough.  Please see problem based charting below for the status of her chronic medical problems.    Past Medical History  Diagnosis Date  . Anemia   . DUB (dysfunctional uterine bleeding)   . Hypertension   . Hep C w/o coma, chronic ([place])   . COPD (chronic obstructive pulmonary disease) ([place])   . Shortness of breath dyspnea   . Depression   . Anxiety    Current Outpatient Prescriptions  Medication Sig Dispense Refill  . albuterol (VENTOLIN HFA) 108 (90 BASE) MCG/ACT inhaler Inhale 2 puffs into the lungs every 4 (four) hours as needed for wheezing. 6.7 g 6  . amLODipine (NORVASC) 10 MG tablet Take 1 tablet (10 mg total) by mouth daily. 90 tablet 3  . dextromethorphan-guaiFENesin (MUCINEX DM) 30-600 MG 12hr tablet Take 1 tablet by mouth 2 (two) times daily as needed for cough. (Patient not taking: Reported on 11/10/2015) 30 tablet 0  . fluticasone (FLONASE) 50 MCG/ACT nasal spray Place 2 sprays into both nostrils daily. (Patient not taking: Reported on 11/10/2015) 16 g 2  . levofloxacin (LEVAQUIN) 750 MG tablet Take 1 tablet (750 mg total) by mouth daily. 5 tablet 0  . metoprolol tartrate (LOPRESSOR) 25 MG tablet Take 1 tablet (25 mg total) by mouth 2 (two) times daily. 60 tablet 3  . mirtazapine (REMERON) 30 MG tablet Take 1 tablet (30 mg total) by mouth at bedtime. 30 tablet 2  . mometasone-formoterol (DULERA) 200-5 MCG/ACT AERO Inhale 2 puffs into the lungs 2 (two) times daily. 1 Inhaler 2  . nystatin (MYCOSTATIN) 100000 UNIT/ML suspension Use four times daily until thrush resolves (Patient not taking: Reported on 11/10/2015) 60 mL 0   No current facility-administered medications for this visit.   Family History  Problem Relation Age of Onset  .  Diabetes     Social History   Social History  . Marital Status: Single    Spouse Name: N/A  . Number of Children: N/A  . Years of Education: N/A   Social History Main Topics  . Smoking status: Former Smoker -- 0.50 packs/day for 9 years    Types: Cigarettes    Quit date: 07/19/2009  . Smokeless tobacco: Never Used  . Alcohol Use: No  . Drug Use: No  . Sexual Activity:    Partners: Male    Birth Control/ Protection: Condom   Other Topics Concern  . None   Social History Narrative   Lives with fiancee in [place]   2 daughter in 20's and 30'3.   Worked as Secretary/administrator in past.            Review of Systems: Review of Systems  Constitutional: Negative for fever, chills and malaise/fatigue.  Respiratory: Positive for cough, sputum production, shortness of breath and wheezing.   Cardiovascular: Negative for chest pain and leg swelling.  Gastrointestinal: Negative for abdominal pain.  Musculoskeletal: Negative for myalgias and falls.  Neurological: Negative for dizziness.     Objective:  Physical Exam: Filed Vitals:   12/14/15 1349  BP: 165/97  Pulse: 98  Temp: 98.2 F (36.8 C)  TempSrc: Oral  Weight: 112 lb 12.8 oz (51.166 kg)  SpO2: 96%  Physical Exam  Constitutional: She is well-developed,  well-nourished, and in no distress.  Cardiovascular: Normal rate and regular rhythm.   Pulmonary/Chest: Effort normal. She has wheezes.  Crackles over right lower lung field, mild expiratory wheezing, no respiratory distress  Abdominal: Soft. Bowel sounds are normal.  Musculoskeletal: She exhibits no edema.  Nursing note and vitals reviewed.   Assessment & Plan:  Case discussed with Dr. Evette Doffing  COPD exacerbation [facility]) HPI: Patient was hospitalized with a COPD exacerbation in Nov 2016, then again with a COPD exacerbation due to HCAP in Dec 2016.  She did not make a hospital follow up after that hospitalization.  She was discharged with a 7 total day course of levaquin at that  time.  She reports that she responded well and fully recovered to her usual state of health.  She was not able to afford the dulea that was prescribed so she has been taking only her albuterol inhaler which helps some.  About 3 days ago she started coughing again, this time yellow sputum.  She does not feel as sick or quite as short of breath as she did in December but thought she should come and be evalauted sooner than later.  She denies any fever or chills. She denies sick contacts She reports that she has a follow up appointment in early feburary with pulmonology.  A: COPD exacerbation  P: I sent her for a CXR to rule out PNA,  She is afebrile here with normal vital signs.  I personally reviewed her CXR and appears overall improved from her last, the radiology read reports possbily early PNA in the RML. - I will treat her as a COPD exacerbation with 5 days of 40mg  of prednisone and 5 days of doxycycline. - She needs better compliance with her COPD controlling medications.  I have placed a referral to Clinical pharmacy, I have given her samples for Spirvia respimat and Dulera to last 1 month, I have also prescribed Spirvia handihaler and Dulera to the MAP program so hopefully after this month she will be able to afford them.  Non compliance with medical treatment - Trouble affording medications - Have sent in meds to Medication assistance program - At high risk for multiple hospital admissions due to lack of understanding of disease process and medication management - Placed referral to clinical pharmacy for follow up    Medications Ordered Meds ordered this encounter  Medications  . doxycycline (VIBRA-TABS) 100 MG tablet    Sig: Take 1 tablet (100 mg total) by mouth 2 (two) times daily.    Dispense:  10 tablet    Refill:  0  . predniSONE (DELTASONE) 20 MG tablet    Sig: Take 2 tablets (40 mg total) by mouth daily. For 5 days    Dispense:  10 tablet    Refill:  0  .  mometasone-formoterol (DULERA) 200-5 MCG/ACT AERO    Sig: Inhale 2 puffs into the lungs 2 (two) times daily.    Dispense:  1 Inhaler    Refill:  11  . tiotropium (SPIRIVA HANDIHALER) 18 MCG inhalation capsule    Sig: Place 1 capsule (18 mcg total) into inhaler and inhale daily.    Dispense:  30 capsule    Refill:  11  . Tiotropium Bromide Monohydrate (SPIRIVA RESPIMAT) 2.5 MCG/ACT AERS    Sig: Inhale 2 puffs into the lungs daily.    Dispense:  2 Inhaler    Refill:  0   Other Orders Orders Placed This Encounter  Procedures  . DG  Chest 2 View    Standing Status: Future     Number of Occurrences: 1     Standing Expiration Date: 02/10/2017    Order Specific Question:  Reason for Exam (SYMPTOM  OR DIAGNOSIS REQUIRED)    Answer:  Cough with yellow sputum x 3 days, Tx for HCAP 1 month ago, Severe COPD    Order Specific Question:  Is the patient pregnant?    Answer:  No    Order Specific Question:  Preferred imaging location?    Answer:  [facility]  . Amb Referral to Clinical Pharmacist    Referral Priority:  Routine    Referral Type:  Consultation    Referred to Provider:  Forde Dandy, [facility]    Number of Visits Requested:  1   Follow Up: Return in about 4 weeks (around 01/11/2016).

## 2015-12-14 NOTE — Patient Instructions (Signed)
General Instructions:  I want you to take 40mg  of prednisone for 5 days, take doxycycline 100mg  twice a day for 5 days  After your finish the prednisone I want you to start Dulera twice a day  You need to also start Sprivia respimat twice a day, eventually I will switch you to the HandiHaler and this will be once a day.  Please bring your medicines with you each time you come to clinic.  Medicines may include prescription medications, over-the-counter medications, herbal remedies, eye drops, vitamins, or other pills.   Progress Toward Treatment Goals:  No flowsheet data found.  Self Care Goals & Plans:  Self Care Goal 12/14/2015  Manage my medications take my medicines as prescribed; bring my medications to every visit; refill my medications on time  Monitor my health keep track of my blood pressure  Eat healthy foods eat foods that are low in salt; eat more vegetables; eat baked foods instead of fried foods  Be physically active find an activity I enjoy    No flowsheet data found.   Care Management & Community Referrals:  No flowsheet data found.

## 2015-12-15 NOTE — Progress Notes (Signed)
Internal Medicine Clinic Attending  Case discussed with Dr. Hoffman at the time of the visit.  We reviewed the resident's history and exam and pertinent patient test results.  I agree with the assessment, diagnosis, and plan of care documented in the resident's note.  

## 2015-12-22 ENCOUNTER — Other Ambulatory Visit: Payer: Self-pay | Admitting: Internal Medicine

## 2015-12-22 DIAGNOSIS — H6123 Impacted cerumen, bilateral: Secondary | ICD-10-CM

## 2015-12-27 ENCOUNTER — Encounter: Payer: Self-pay | Admitting: Internal Medicine

## 2015-12-27 ENCOUNTER — Ambulatory Visit (INDEPENDENT_AMBULATORY_CARE_PROVIDER_SITE_OTHER): Payer: No Typology Code available for payment source | Admitting: Internal Medicine

## 2015-12-27 ENCOUNTER — Other Ambulatory Visit: Payer: No Typology Code available for payment source

## 2015-12-27 VITALS — BP 138/90 | HR 92 | Ht 63.0 in | Wt 109.6 lb

## 2015-12-27 DIAGNOSIS — J14 Pneumonia due to Hemophilus influenzae: Secondary | ICD-10-CM

## 2015-12-27 DIAGNOSIS — J449 Chronic obstructive pulmonary disease, unspecified: Secondary | ICD-10-CM

## 2015-12-27 DIAGNOSIS — J9611 Chronic respiratory failure with hypoxia: Secondary | ICD-10-CM

## 2015-12-27 NOTE — Patient Instructions (Addendum)
ICD-9-CM ICD-10-CM   1. Chronic respiratory failure with hypoxia (HCC) 518.83 J96.11    799.02    2. Pneumonia of right lung due to Haemophilus influenzae, unspecified part of lung ([place]) 482.2 J14   3. COPD, severe ([place]) 496 J44.9     -  Improved compared to December 2016 - You have advanced COPD  Plan  - Continue oxygen 2 L nasal cannula and ensure you have pulse ox is greater than 88% - Refer pulmonary rehabilitation at [facility] alpha 1 antitrypsin low blood levels  - Continue Spiriva and Dulera daily - Use albuterol as needed - I recommend flu shot and pneumonia shot - respect her decision to refuse but please consider this in the future - Recommend total disability - At future visit we will talk about lung transplant  - Do follow-up CT chest without contrast end of March 2017  Follow-up - CT chest without contrast end March 2017 - Return to see me after the CT chest in and March 2017 or early April 2017

## 2015-12-27 NOTE — Progress Notes (Signed)
Subjective:    Patient ID: Cynthia Martinez, female    DOB: [birth year], 55 y.o.   MRN: [id number]  HPI  OV 12/27/2015  Chief Complaint  Patient presents with  . Pulmonary Consult    Pt last seen in 09/2012. Pt states her breathing has worsened since last seen. Pt c/o prod cough with yellow mucus and chest pain when coughing.    PI PCP is Santa Lighter, MD  Body mass index is 18.03 kg/(m^2).  reports that she quit smoking about 3 months ago. AGe 27 to 10; 1/2 ppd IOV 09/28/2012  55 year old female c.o chronic cough. She is worried she might have infection in lung. She feels better only after coughing and bringing out mucus; this is facilitated by inhalers  Cough x insidious onset x 1 year ago.  Stable course. Severit is moderate. Quality of cough: sleeps well "like a baby" and early in the morning she will have cough with mucus that is facilitaed by albuterol mdi. Post early morning cough: no further cough. Cough is present mostly only early in the morning and hardly ever for rest of the day.  No nocturnal cough. Rx with mdi (albuterold), prednisone, antibiotic courses, GERD but none of this has helped. HOwever, per her hx ppi course was only for 7 days and prednisone was only for 9 days.   Cough worsened by car heater, early in the morning and fiance's cig smoke (periodically exposed to his smoke). Cough is better by inhaler, passage of the day, absence of cig smoke exposure and open ventilation.. No associated wheeze, tickle in throat, gag.  In addition, there is associated dyspnea. Dyspnea correlated with cough but can also be exertional; 1 flight of steps. Rest improves dyspnea   Cough Relevant hx  BP  - on amlodipine for bp for 1 year  - on lopressor since oct 2013  Sinus  - denies sinus issues but has mild runny nose  - denies anosmia or blockage  Allergies  - denies seasonal or perennnial environmental allergies  Exposure  - cig smoke periodically from boyfriend  currently - she is an ex-smoker   GI  - denies GERD. Has tried PPI for cough but did not help cough  Pulmonaary - She is on advair x 3 months for dyspnea and cough (as above) but of no help with cough but has helped dyspnea - ALbuterol helps short period with cough - CXR 08/15/12 - clear (never had CT chest) - 09/28/2012 office spirometry - fevq 0.8L/36%, R 36 and c/w SEVERE OBSTRUCTION - Walking desaturation test on 09/28/2012 185 feet x 3 laps:  did NOT desaturate. Rest pulse ox was 91%, final pulse ox was 96%. HR response 86/min at rest to 96/min at peak     [facility] Please have full PFT breathing test next 48h  Once the technician completes test, please remind them to page me with result  Further instructions based on result  OV 10/09/2012  pft 09/30/12 shows severe copd. fev1 0.9L/41%, rato 42, DLCO 14/58%. SHe is here to discuss results. No interim events  Past, Family, Social reviewed: no change since last visit   New visit December 27, 2015 because it has been more than 3 years since I last saw her  - 55 year old African-American female last seen over 3 years ago with Gold stage III COPD. After that lost to follow-up. According to her history she is a limited smoker but not sure this history is true because my nurse  elicited only a 4.5 pack smoking history today but 3 years ago review of the records show that she give me a 12-1/2 pack smoking history. She tells me her in the last 3 years overall she was doing well but she's gotten disabled and unable to work because of her COPD. Then in November and December 2016 she reports to admissions for pneumonia. Review of the record shows that the December 2016 admission forr Haemophilus influenza pneumonia  on the lower lobe as visualized on the CT scan of the chest that I saw  personally. Since then she's been more deconditioned. She has class III-for dyspnea on exertion not much cough. She says that without oxygen she desaturates easily to  60%. She's been on oxygen since November 2016 at 2 L nasal cannula. Currently she is improved since of December 2016 admission with still quite symptomatic. She has declined flu shot and pneumonia shot for unclear reasons. Although she has had hepatitis vaccines. She has never been to pulmonary rehabilitation. She is asking about applying for disability.    has a past medical history of Anemia; DUB (dysfunctional uterine bleeding); Hypertension; Hep C w/o coma, chronic (HCC); COPD (chronic obstructive pulmonary disease) ([place]); Shortness of breath dyspnea; Depression; and Anxiety.   reports that she quit smoking about 6 years ago. Her smoking use included Cigarettes. She has a 4.5 pack-year smoking history. She has never used smokeless tobacco.  Past Surgical History  Procedure Laterality Date  . Polypectomy      Allergies  Allergen Reactions  . Hydrochlorothiazide Hives  . Penicillins Hives    Tolerated ceftriaxone  Has patient had a PCN reaction causing immediate rash, facial/tongue/throat swelling, SOB or lightheadedness with hypotension:YES Has patient had a PCN reaction causing severe rash involving mucus membranes or skin necrosis: NO Has patient had a PCN reaction that required hospitalization NO Has patient had a PCN reaction occurring within the last 10 years: NO If all of the above answers are "NO", then may proceed with Cephalosporin use.   . Sulfa Antibiotics Hives    Immunization History  Administered Date(s) Administered  . Hepatitis A 05/10/2013  . Hepatitis A, Adult 08/19/2014  . Hepatitis B 05/10/2013  . Hepatitis B, ped/adol 08/19/2014, 08/04/2015    Family History  Problem Relation Age of Onset  . Diabetes       Current outpatient prescriptions:  .  albuterol (VENTOLIN HFA) 108 (90 BASE) MCG/ACT inhaler, Inhale 2 puffs into the lungs every 4 (four) hours as needed for wheezing., Disp: 6.7 g, Rfl: 6 .  amLODipine (NORVASC) 10 MG tablet, Take 1 tablet (10 mg  total) by mouth daily., Disp: 90 tablet, Rfl: 3 .  fluticasone (FLONASE) 50 MCG/ACT nasal spray, Place 2 sprays into both nostrils daily., Disp: 16 g, Rfl: 2 .  metoprolol tartrate (LOPRESSOR) 25 MG tablet, Take 1 tablet (25 mg total) by mouth 2 (two) times daily., Disp: 60 tablet, Rfl: 3 .  mirtazapine (REMERON) 30 MG tablet, Take 1 tablet (30 mg total) by mouth at bedtime., Disp: 30 tablet, Rfl: 2 .  mometasone-formoterol (DULERA) 200-5 MCG/ACT AERO, Inhale 2 puffs into the lungs 2 (two) times daily., Disp: 1 Inhaler, Rfl: 11 .  Tiotropium Bromide Monohydrate (SPIRIVA RESPIMAT) 2.5 MCG/ACT AERS, Inhale 2 puffs into the lungs daily., Disp: 2 Inhaler, Rfl: 0 .  dextromethorphan-guaiFENesin (MUCINEX DM) 30-600 MG 12hr tablet, Take 1 tablet by mouth 2 (two) times daily as needed for cough. (Patient not taking: Reported on 11/10/2015), Disp: 30  tablet, Rfl: 0    Review of Systems  Constitutional: Negative for fever and unexpected weight change.  HENT: Negative for congestion, dental problem, ear pain, nosebleeds, postnasal drip, rhinorrhea, sinus pressure, sneezing, sore throat and trouble swallowing.   Eyes: Negative for redness and itching.  Respiratory: Positive for cough and shortness of breath. Negative for chest tightness and wheezing.   Cardiovascular: Negative for palpitations and leg swelling.  Gastrointestinal: Negative for nausea and vomiting.  Genitourinary: Negative for dysuria.  Musculoskeletal: Negative for joint swelling.  Skin: Negative for rash.  Neurological: Negative for headaches.  Hematological: Does not bruise/bleed easily.  Psychiatric/Behavioral: Negative for dysphoric mood. The patient is not nervous/anxious.        Objective:   Physical Exam  Constitutional: She is oriented to person, place, and time. She appears well-developed and well-nourished. No distress.  Body mass index is 19.42 kg/(m^2).   HENT:  Head: Normocephalic and atraumatic.  Right Ear: External  ear normal.  Left Ear: External ear normal.  Mouth/Throat: Oropharynx is clear and moist. No oropharyngeal exudate.  Eyes: Conjunctivae and EOM are normal. Pupils are equal, round, and reactive to light. Right eye exhibits no discharge. Left eye exhibits no discharge. No scleral icterus.  Neck: Normal range of motion. Neck supple. No JVD present. No tracheal deviation present. No thyromegaly present.  Cardiovascular: Normal rate, regular rhythm, normal heart sounds and intact distal pulses.  Exam reveals no gallop and no friction rub.   No murmur heard. Pulmonary/Chest: Effort normal and breath sounds normal. No respiratory distress. She has no wheezes. She has no rales. She exhibits no tenderness.  Purse lip breathing o2 on  Abdominal: Soft. Bowel sounds are normal. She exhibits no distension and no mass. There is no tenderness. There is no rebound and no guarding.  Musculoskeletal: Normal range of motion. She exhibits no edema or tenderness.  Lymphadenopathy:    She has no cervical adenopathy.  Neurological: She is alert and oriented to person, place, and time. She has normal reflexes. No cranial nerve deficit. She exhibits normal muscle tone. Coordination normal.  Skin: Skin is warm and dry. No rash noted. She is not diaphoretic. No erythema. No pallor.  Psychiatric: She has a normal mood and affect. Her behavior is normal. Judgment and thought content normal.  Vitals reviewed.   Filed Vitals:   12/27/15 1536  BP: 138/90  Pulse: 92  SpO2: 98%         Assessment & Plan:     ICD-9-CM ICD-10-CM   1. Chronic respiratory failure with hypoxia (HCC) 518.83 J96.11    799.02    2. Pneumonia of right lung due to Haemophilus influenzae, unspecified part of lung ([place]) 482.2 J14   3. 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       -  Improved compared to December 2016 - You have advanced COPD  Plan  - Continue oxygen 2 L nasal cannula and ensure you have pulse ox is greater than 88% - Refer  pulmonary rehabilitation at [place] alpha 1 antitrypsin low blood levels  - Continue Spiriva and Dulera daily - Use albuterol as needed - I recommend flu shot and pneumonia shot - respect her decision to refuse but please consider this in the future - Recommend total disability - At future visit we will talk about lung transplant  - Do follow-up CT chest without contrast end of March 2017  Follow-up - CT chest without contrast end March 2017 - Return to  see me after the CT chest in and March 2017 or early April 2017   Dr. Brand Males, M.D., F.C.C.P Pulmonary and Critical Care Medicine Staff Physician [place] [facility] Pager: [phone], If no answer or between  15:00h - 7:00h: call [phone]  12/27/2015 3:56 PM

## 2016-01-03 LAB — ALPHA-1 ANTITRYPSIN PHENOTYPE: A1 ANTITRYPSIN: 175 mg/dL (ref 83–199)

## 2016-01-05 ENCOUNTER — Ambulatory Visit: Payer: No Typology Code available for payment source | Admitting: Internal Medicine

## 2016-01-09 ENCOUNTER — Telehealth (HOSPITAL_COMMUNITY): Payer: Self-pay

## 2016-01-11 ENCOUNTER — Telehealth: Payer: Self-pay | Admitting: Internal Medicine

## 2016-01-11 NOTE — Telephone Encounter (Signed)
APPT REMINDER CALL, LMTCB IF SHE NEEDS TO CANCEL °

## 2016-01-12 ENCOUNTER — Ambulatory Visit: Payer: Self-pay | Admitting: Pharmacist

## 2016-01-12 ENCOUNTER — Ambulatory Visit (INDEPENDENT_AMBULATORY_CARE_PROVIDER_SITE_OTHER): Payer: Self-pay | Admitting: Internal Medicine

## 2016-01-12 ENCOUNTER — Encounter: Payer: Self-pay | Admitting: Internal Medicine

## 2016-01-12 VITALS — BP 129/82 | HR 72 | Temp 98.0°F | Wt 112.6 lb

## 2016-01-12 DIAGNOSIS — Z7951 Long term (current) use of inhaled steroids: Secondary | ICD-10-CM

## 2016-01-12 DIAGNOSIS — Z681 Body mass index (BMI) 19 or less, adult: Secondary | ICD-10-CM

## 2016-01-12 DIAGNOSIS — Z87891 Personal history of nicotine dependence: Secondary | ICD-10-CM

## 2016-01-12 DIAGNOSIS — J309 Allergic rhinitis, unspecified: Secondary | ICD-10-CM

## 2016-01-12 DIAGNOSIS — Z79899 Other long term (current) drug therapy: Secondary | ICD-10-CM

## 2016-01-12 DIAGNOSIS — R636 Underweight: Secondary | ICD-10-CM

## 2016-01-12 DIAGNOSIS — Z719 Counseling, unspecified: Secondary | ICD-10-CM

## 2016-01-12 DIAGNOSIS — Z0000 Encounter for general adult medical examination without abnormal findings: Secondary | ICD-10-CM

## 2016-01-12 DIAGNOSIS — J449 Chronic obstructive pulmonary disease, unspecified: Secondary | ICD-10-CM

## 2016-01-12 DIAGNOSIS — J302 Other seasonal allergic rhinitis: Secondary | ICD-10-CM

## 2016-01-12 DIAGNOSIS — Z9981 Dependence on supplemental oxygen: Secondary | ICD-10-CM

## 2016-01-12 DIAGNOSIS — F329 Major depressive disorder, single episode, unspecified: Secondary | ICD-10-CM

## 2016-01-12 DIAGNOSIS — F32A Depression, unspecified: Secondary | ICD-10-CM

## 2016-01-12 MED ORDER — MIRTAZAPINE 45 MG PO TABS: 45.0000 mg | ORAL_TABLET | Freq: Every day | ORAL | Status: DC

## 2016-01-12 NOTE — Progress Notes (Signed)
Patient ID: Cynthia Martinez, female   DOB: [birth year], 55 y.o.   MRN: [id number]    Subjective:   Patient ID: Cynthia Martinez female   DOB: 1961/10/10 55 y.o.   MRN: [id number]  HPI: Cynthia Martinez is a 55 y.o. very pleasant woman with past medical history of hypertension, pernicious anemia, treated hepatitis C infection, chronic underweight, and COPD GOLD Stage 3 on home oxygen who presents for follow-up of underweight.   I started her on mirtazapine on 08/04/15 due to chronic underweight and mild depression. She has increased appetite and consequently gained 13.7  lb since then with mild to moderate improvement in mood. She would like to increase the dose. She declines DEXA scan at this time.   She reports compliance with taking amlodipine and lopressor for hypertension. She has sinus headache but denies blurry vision, chest pain, LE edema, or lightheadedness.   She has Gold Stage 3 COPD with last PFT"s in 2013. She has had 3 recent exacerbations in setting of right middle lob H. Influenza pneumonia and completed antbiotic and steroid courses. She is now on 2L oxygen at home. She was seen by pulmonologist Dr. Chase Caller on 12/27/15. She is to attend pulmonary rehab and have CT chest on 02/12/16 and possible discussion regarding lung transplant in the future.  At last visit on 12/14/15 she was given free samples of spiriva and dulera which she reports compliance with but is soon running out of. She reports her breathing is currently at baseline. She is a former tobacco smoker and quit in 2010.   She reports having nasal congestion and sinus pressure. She has seasonal  allergic rhinitis and is not currently taking flonase nasal spray or anti-histamine.    She declines tdap, flu and pneumonia vaccinations at this time.     Past Medical History  Diagnosis Date  . Anemia   . DUB (dysfunctional uterine bleeding)   . Hypertension   . Hep C w/o coma, chronic ([place])   . COPD (chronic obstructive  pulmonary disease) ([place])   . Shortness of breath dyspnea   . Depression   . Anxiety    Current Outpatient Prescriptions  Medication Sig Dispense Refill  . albuterol (VENTOLIN HFA) 108 (90 BASE) MCG/ACT inhaler Inhale 2 puffs into the lungs every 4 (four) hours as needed for wheezing. 6.7 g 6  . amLODipine (NORVASC) 10 MG tablet Take 1 tablet (10 mg total) by mouth daily. 90 tablet 3  . dextromethorphan-guaiFENesin (MUCINEX DM) 30-600 MG 12hr tablet Take 1 tablet by mouth 2 (two) times daily as needed for cough. (Patient not taking: Reported on 11/10/2015) 30 tablet 0  . fluticasone (FLONASE) 50 MCG/ACT nasal spray Place 2 sprays into both nostrils daily. 16 g 2  . metoprolol tartrate (LOPRESSOR) 25 MG tablet Take 1 tablet (25 mg total) by mouth 2 (two) times daily. 60 tablet 3  . mirtazapine (REMERON) 30 MG tablet Take 1 tablet (30 mg total) by mouth at bedtime. 30 tablet 2  . mometasone-formoterol (DULERA) 200-5 MCG/ACT AERO Inhale 2 puffs into the lungs 2 (two) times daily. 1 Inhaler 11  . Tiotropium Bromide Monohydrate (SPIRIVA RESPIMAT) 2.5 MCG/ACT AERS Inhale 2 puffs into the lungs daily. 2 Inhaler 0   No current facility-administered medications for this visit.   Family History  Problem Relation Age of Onset  . Diabetes     Social History   Social History  . Marital Status: Single    Spouse Name: N/A  .  Number of Children: N/A  . Years of Education: N/A   Social History Main Topics  . Smoking status: Former Smoker -- 0.50 packs/day for 9 years    Types: Cigarettes    Quit date: 07/19/2012  . Smokeless tobacco: Never Used  . Alcohol Use: No  . Drug Use: No  . Sexual Activity:    Partners: Male    Birth Control/ Protection: Condom   Other Topics Concern  . None   Social History Narrative   Lives with fiancee in [place]   2 daughter in 20's and 30'3.   Worked as Secretary/administrator in past.            Review of Systems: Review of Systems  Constitutional: Negative for  fever and chills.       Weight gain, increased appetite  HENT: Positive for congestion.   Eyes: Negative for blurred vision.  Respiratory: Positive for shortness of breath (chronic ). Negative for cough and wheezing.        COPD  Cardiovascular: Negative for chest pain, palpitations and leg swelling.  Gastrointestinal: Negative for nausea, vomiting, abdominal pain, diarrhea, constipation and blood in stool.  Genitourinary: Negative for dysuria, urgency, frequency and hematuria.  Musculoskeletal: Positive for back pain (chronic low back).  Neurological: Positive for headaches. Negative for dizziness.  Endo/Heme/Allergies: Positive for environmental allergies.  Psychiatric/Behavioral: Positive for depression (mild).    Objective:  Physical Exam: Filed Vitals:   01/12/16 1521  BP: 129/82  Pulse: 72  Temp: 98 F (36.7 C)  TempSrc: Oral  Weight: 112 lb 9.6 oz (51.075 kg)  SpO2: 96%    Physical Exam  Constitutional: She appears well-developed and well-nourished. No distress.  HENT:  Head: Normocephalic and atraumatic.  Right Ear: External ear normal.  Left Ear: External ear normal.  Nose: Nose normal.  Mouth/Throat: Oropharynx is clear and moist. No oropharyngeal exudate.  Eyes: Conjunctivae and EOM are normal. Pupils are equal, round, and reactive to light. Right eye exhibits no discharge. Left eye exhibits no discharge. No scleral icterus.  Neck: Normal range of motion. Neck supple.  Cardiovascular: Normal rate, regular rhythm and normal heart sounds.   Pulmonary/Chest: Effort normal. No respiratory distress. She has no wheezes. She has no rales.  Mildly decreased air flow  Abdominal: Soft. Bowel sounds are normal. She exhibits no distension. There is no tenderness. There is no rebound and no guarding.  Musculoskeletal: Normal range of motion. She exhibits no edema or tenderness.  Neurological: She is alert.  Skin: Skin is warm and dry. No rash noted. She is not diaphoretic.  No erythema. No pallor.  Psychiatric: She has a normal mood and affect. Her behavior is normal. Judgment and thought content normal.    Assessment & Plan:   Please see problem list for problem-based assessment and plan

## 2016-01-12 NOTE — Patient Instructions (Addendum)
-  Will increase your mirtazapine to 45 mg daily, great job on improving your weight! -Start taking cetirizine 10 mg daily for your allergies  and flonase nasal spray -Will give the tetanus shot next time and check your boodwork next time  -Glad to see you, please come back in 3 months!  General Instructions:   Please bring your medicines with you each time you come to clinic.  Medicines may include prescription medications, over-the-counter medications, herbal remedies, eye drops, vitamins, or other pills.   Progress Toward Treatment Goals:  No flowsheet data found.  Self Care Goals & Plans:  Self Care Goal 12/14/2015  Manage my medications take my medicines as prescribed; bring my medications to every visit; refill my medications on time  Monitor my health keep track of my blood pressure  Eat healthy foods eat foods that are low in salt; eat more vegetables; eat baked foods instead of fried foods  Be physically active find an activity I enjoy    No flowsheet data found.   Care Management & Community Referrals:  No flowsheet data found.

## 2016-01-12 NOTE — Progress Notes (Signed)
Patient ID: Cynthia Martinez, female   DOB: 06/06/1961, 55 y.o.   MRN: [id number] Medication Samples have been provided to the patient.  Drug name: Stiolto        Strength: 2.58mcg/2.5mcg       Qty: 1  [id number] G  Exp.Date: Jan2018 Drug name: Asmanex   Strength: 219mcg    Qty: 1  LOT: [id number]  Exp.Date: [id number]  The patient has been instructed regarding the correct time, dose, and frequency of taking this medication, including desired effects and most common side effects.   [name] 4:44 PM 01/12/2016

## 2016-01-13 MED ORDER — FLUTICASONE PROPIONATE 50 MCG/ACT NA SUSP: 2.0000 | Freq: Every day | NASAL | Status: DC

## 2016-01-13 MED ORDER — CETIRIZINE HCL 10 MG PO CHEW: 10.0000 mg | CHEWABLE_TABLET | Freq: Every day | ORAL | Status: DC

## 2016-01-13 NOTE — Assessment & Plan Note (Signed)
Assessment: Pt with seasonal allergic rhinitis not currently on therapy who presents with nasal congestion and sinus pressure symptoms.    Plan:  -Pt instructed to take OTC cetrizine 10 mg daily -Refill flonase 2 sprays in each nare daily

## 2016-01-13 NOTE — Progress Notes (Signed)
Patient was seen in clinic with Tanya Makhlouf, PharmD candidate. I agree with the assessment and plan of care documented by Tanya. 

## 2016-01-13 NOTE — Assessment & Plan Note (Signed)
Assessment: Pt is chronically underweight who presents with 13.7 lb weight gain since starting mirtazapine.   Plan:  -BMI 19.95 not at goal 20-25  -Increase mirtazapine form 30 mg daily to 45 mg daily

## 2016-01-13 NOTE — Assessment & Plan Note (Addendum)
Assessment: Pt is a former smoker with COPD Gold Stage 3 with last PFT's on 09/28/12 compliant with maintenance inhaler therapy who presents with no exacerbation.   Plan:  -SpO2 96% on RA, continue 2L oxygen at home with goal 88-92%  -Pt to attend pulmonary rehab and follow-up with Dr. Chase Caller on 02/14/16, needs repeat PFT's -CT chest w/contrast on 02/12/16 to assess for malignancy in setting of recurrent pneumonia  -Continue dulera 200-5 mcg 2 puffs BID and spiriva 2 puffs daily -Pt given sample of stiolto 2 puffs daily and asmanex 2 puffs  BID to use once above runs out -Continue albuterol 2 puffs Q 4 hr PRN acute bronchospasm  -Dr Maudie Mercury to help with acquiring of inhalers

## 2016-01-13 NOTE — Assessment & Plan Note (Signed)
Assessment: Pt with 80-month history of depression after passing of her father compliant with anti-depressant therapy who presents with mild to moderate improvement in mood.   Plan:  -Increase mirtazapine from 30 mg daily to maximum 45 mg daily at bedtime (to also help with appetite and weight gain)  -Continue to monitor mood

## 2016-01-13 NOTE — Assessment & Plan Note (Addendum)
-  Pt declines DEXA scan in setting of underweight -Pt declines influenza, pneumococcal, and tdap vaccinations at this time -Obtain CMP, B12, and 25-OH vitamin D level at next, declined today

## 2016-01-15 NOTE — Progress Notes (Signed)
Internal Medicine Clinic Attending  Case discussed with Dr. Rabbani soon after the resident saw the patient.  We reviewed the resident's history and exam and pertinent patient test results.  I agree with the assessment, diagnosis, and plan of care documented in the resident's note.  

## 2016-01-19 MED FILL — MIRTAZAPINE 45 MG TABLET: 45 | 30 days supply | Qty: 30 | Fill #0 | Status: TO

## 2016-01-29 ENCOUNTER — Ambulatory Visit: Payer: No Typology Code available for payment source | Admitting: Gastroenterology

## 2016-01-31 ENCOUNTER — Telehealth: Payer: Self-pay | Admitting: Internal Medicine

## 2016-01-31 NOTE — Telephone Encounter (Signed)
I have not received this. Will call pt in AM.

## 2016-01-31 NOTE — Telephone Encounter (Signed)
Pt called in. She reports disability was suppose to send over a form requesting for her to have PFT done prior to her appt with Dr. Chase Caller on 02/16/16. Please advise Daneil Dan if this was received? thanks

## 2016-02-01 NOTE — Telephone Encounter (Signed)
Called and spoke to pt. Informed her we did not receive her disability paperwork. Pt states she does not want to have a PFT unless Dr. Chase Caller requests this. Pt states she wants to hold off on the disability paperwork until she comes in for OV with MR and will discuss this with him then. Nothing further needed at this time.

## 2016-02-12 ENCOUNTER — Ambulatory Visit (INDEPENDENT_AMBULATORY_CARE_PROVIDER_SITE_OTHER)
Admission: RE | Admit: 2016-02-12 | Discharge: 2016-02-12 | Disposition: A | Discharge status: Home / Self Care | Payer: Self-pay | Source: Ambulatory Visit | Attending: Internal Medicine | Admitting: Internal Medicine

## 2016-02-12 ENCOUNTER — Telehealth: Payer: Self-pay | Admitting: Internal Medicine

## 2016-02-12 ENCOUNTER — Ambulatory Visit: Payer: Self-pay

## 2016-02-12 DIAGNOSIS — J449 Chronic obstructive pulmonary disease, unspecified: Secondary | ICD-10-CM

## 2016-02-12 DIAGNOSIS — J14 Pneumonia due to Hemophilus influenzae: Secondary | ICD-10-CM

## 2016-02-12 NOTE — Telephone Encounter (Signed)
Spoke to patient to clarify regimen and no interactions of concern. Patient verbalized understanding.

## 2016-02-12 NOTE — Telephone Encounter (Signed)
Spoke with patient, she had questions regarding the scheduling of her daily medications and inhalers.  She would like pharmacist to call to ensure she is taking her medications at appropriate intervals.  Will send to Dr. Maudie Mercury

## 2016-02-12 NOTE — Telephone Encounter (Signed)
Pt requesting the nurse to call back regarding med.  °

## 2016-02-13 ENCOUNTER — Ambulatory Visit: Payer: Self-pay

## 2016-02-14 ENCOUNTER — Ambulatory Visit: Payer: No Typology Code available for payment source | Admitting: Internal Medicine

## 2016-02-14 ENCOUNTER — Telehealth: Payer: Self-pay | Admitting: Internal Medicine

## 2016-02-14 DIAGNOSIS — J14 Pneumonia due to Hemophilus influenzae: Secondary | ICD-10-CM

## 2016-02-14 NOTE — Telephone Encounter (Signed)
Ct as fu of pneumonia from dec 2016 is showing steady improvement but not fully resolved. Will need repeat CT chest wo contrast due to pneumonia and RML collapse in 3 months  Ct Chest Wo Contrast  02/12/2016  CLINICAL DATA:  Right middle lobe pneumonia, treated for an and scratch the treated with antibiotics. Evaluate for resolution. EXAM: CT CHEST WITHOUT CONTRAST TECHNIQUE: Multidetector CT imaging of the chest was performed following the standard protocol without IV contrast. COMPARISON:  11/10/2015 and 09/20/2015. FINDINGS: Mediastinum/Nodes: No pathologically enlarged mediastinal or axillary lymph nodes. Hilar regions are difficult to definitively evaluate without IV contrast. Coronary artery calcification. Heart size normal. No pericardial effusion. Lungs/Pleura: Complete collapse of the right middle lobe with mid and lower lung zone predominant peribronchovascular nodularity and mild bronchiectasis. Nodularity appears somewhat improved from 11/10/2015. Residual volume loss/ consolidation in the left lower lobe. No pleural fluid. Adherent debris is seen in the trachea. Upper abdomen: Visualized portions of the liver, adrenal glands, kidneys, spleen, pancreas and stomach are grossly unremarkable. Musculoskeletal: No worrisome lytic or sclerotic lesions. IMPRESSION: 1. Continued improvement in peribronchovascular nodularity with residual involvement of the mid and lower lung zones. Persistent right middle lobe collapse when compared with 11/10/2015, new from 09/20/2015. Findings most likely represent evolving post infectious scarring. 2. Coronary artery calcification. Electronically Signed   By: Lorin Picket M.D.   On: 02/12/2016 16:15

## 2016-02-14 NOTE — Telephone Encounter (Signed)
Pt returned call. Informed her of the results and recs per MR. Order placed for repeat CT chest. Pt verbalized understanding and requested a copy of her CT chest, verified home address. Pt also requested to do a PFT that is requested from the social security office.   MR please advise if ok to order PFT. Thanks.

## 2016-02-14 NOTE — Telephone Encounter (Signed)
lmtcb for pt.  

## 2016-02-15 NOTE — Telephone Encounter (Signed)
That is all fine x 2

## 2016-02-15 NOTE — Telephone Encounter (Signed)
Called and spoke with pt. Informed her that MR is okay with ordering PFT. She voiced understanding and had no further questions. PFT is scheduled for 04/03/16 at 9am. She voiced understanding and had no further questions.

## 2016-02-16 ENCOUNTER — Ambulatory Visit: Payer: Self-pay | Admitting: Internal Medicine

## 2016-02-22 ENCOUNTER — Ambulatory Visit: Payer: Self-pay | Admitting: Internal Medicine

## 2016-02-27 NOTE — Addendum Note (Signed)
Addended by: Hulan Fray on: 02/27/2016 07:38 PM   Modules accepted: Orders

## 2016-04-04 ENCOUNTER — Ambulatory Visit: Payer: Self-pay | Admitting: Internal Medicine

## 2016-04-12 ENCOUNTER — Encounter: Payer: Self-pay | Admitting: Internal Medicine

## 2016-04-23 ENCOUNTER — Other Ambulatory Visit: Payer: Self-pay | Admitting: Internal Medicine

## 2016-04-25 NOTE — Telephone Encounter (Signed)
Patient no showed on 04-12-16 with Dr. Naaman Plummer.  Dr. Naaman Plummer will be leaving this month and her last clinic is full on 04-26-16.  Patient will be receiving a letter in the mail stating who her new doctor will be and to call for an appt the end of this month.

## 2016-05-08 ENCOUNTER — Encounter: Payer: Self-pay | Admitting: *Deleted

## 2016-05-16 ENCOUNTER — Inpatient Hospital Stay: Admission: RE | Admit: 2016-05-16 | Payer: Self-pay | Source: Ambulatory Visit

## 2016-06-24 MED FILL — MIRTAZAPINE 45 MG TABLET: 45 | 30 days supply | Qty: 30 | Fill #0

## 2016-08-13 ENCOUNTER — Telehealth: Payer: Self-pay | Admitting: Internal Medicine

## 2016-08-13 NOTE — Telephone Encounter (Signed)
REMINDER CALL, IT IS TIME TO RENEW GCCN CARD, LMTCB °

## 2016-08-29 MED FILL — MIRTAZAPINE 45 MG TABLET: 45 | 30 days supply | Qty: 30 | Fill #1

## 2016-10-28 DIAGNOSIS — H6983 Other specified disorders of Eustachian tube, bilateral: Secondary | ICD-10-CM | POA: Insufficient documentation

## 2016-11-06 ENCOUNTER — Other Ambulatory Visit: Payer: Self-pay | Admitting: *Deleted

## 2016-11-06 NOTE — Telephone Encounter (Signed)
Received call from patient that she now has medicaid and no longer qualify for the pt assistance program at the [place].  Pt is requesting rx for spiriva handihaler to be sent to the [facility] on [address].  Her chart reflects that she is on spiriva respimat because she was provided samples in the office during her visit on 12/13/2014, but physician actually sent in order for the spiriva handihaler [facility]  I will contact pt back today with an appt to be seen next avail with pcp or [facility] if necessary within the next 2-3wks.  Will forward refill request to pcp for review, please advise.Despina Hidden Cassady12/20/20171:20 PM

## 2016-11-07 MED ORDER — TIOTROPIUM BROMIDE MONOHYDRATE 2.5 MCG/ACT IN AERS: 2.0000 | INHALATION_SPRAY | Freq: Every day | RESPIRATORY_TRACT | 2 refills | Status: DC

## 2016-11-07 NOTE — Telephone Encounter (Signed)
Pt calling checking on the status of her refill.Despina Hidden Cassady12/21/20174:03 PM

## 2016-11-13 MED ORDER — TIOTROPIUM BROMIDE MONOHYDRATE 18 MCG IN CAPS: 18.0000 ug | ORAL_CAPSULE | Freq: Every day | RESPIRATORY_TRACT | 2 refills | Status: DC

## 2016-12-09 ENCOUNTER — Telehealth: Payer: Self-pay

## 2016-12-09 MED ORDER — ALBUTEROL SULFATE HFA 108 (90 BASE) MCG/ACT IN AERS: 2.0000 | INHALATION_SPRAY | RESPIRATORY_TRACT | 6 refills | Status: DC | PRN

## 2016-12-09 NOTE — Telephone Encounter (Signed)
albuterol (VENTOLIN HFA) 108 (90 BASE) MCG/ACT inhaler, refill request @ [facility] on  [address].

## 2016-12-10 ENCOUNTER — Encounter: Payer: Self-pay | Admitting: Internal Medicine

## 2016-12-18 MED FILL — MIRTAZAPINE 45 MG TABLET: 45 | 30 days supply | Qty: 30 | Fill #2

## 2017-01-21 ENCOUNTER — Ambulatory Visit (INDEPENDENT_AMBULATORY_CARE_PROVIDER_SITE_OTHER): Payer: Medicaid Other | Admitting: Internal Medicine

## 2017-01-21 ENCOUNTER — Encounter: Payer: Self-pay | Admitting: Internal Medicine

## 2017-01-21 VITALS — BP 134/87 | HR 89 | Temp 98.5°F | Ht 63.0 in | Wt 103.8 lb

## 2017-01-21 DIAGNOSIS — Z681 Body mass index (BMI) 19 or less, adult: Secondary | ICD-10-CM

## 2017-01-21 DIAGNOSIS — J449 Chronic obstructive pulmonary disease, unspecified: Secondary | ICD-10-CM

## 2017-01-21 DIAGNOSIS — Z1211 Encounter for screening for malignant neoplasm of colon: Secondary | ICD-10-CM

## 2017-01-21 DIAGNOSIS — Z0000 Encounter for general adult medical examination without abnormal findings: Secondary | ICD-10-CM

## 2017-01-21 DIAGNOSIS — R1319 Other dysphagia: Secondary | ICD-10-CM

## 2017-01-21 DIAGNOSIS — F3289 Other specified depressive episodes: Secondary | ICD-10-CM

## 2017-01-21 DIAGNOSIS — Z7951 Long term (current) use of inhaled steroids: Secondary | ICD-10-CM | POA: Diagnosis not present

## 2017-01-21 DIAGNOSIS — F329 Major depressive disorder, single episode, unspecified: Secondary | ICD-10-CM | POA: Diagnosis not present

## 2017-01-21 DIAGNOSIS — R636 Underweight: Secondary | ICD-10-CM

## 2017-01-21 DIAGNOSIS — I10 Essential (primary) hypertension: Secondary | ICD-10-CM

## 2017-01-21 DIAGNOSIS — R1310 Dysphagia, unspecified: Secondary | ICD-10-CM

## 2017-01-21 DIAGNOSIS — Z87891 Personal history of nicotine dependence: Secondary | ICD-10-CM | POA: Diagnosis not present

## 2017-01-21 DIAGNOSIS — Z79899 Other long term (current) drug therapy: Secondary | ICD-10-CM | POA: Diagnosis not present

## 2017-01-21 NOTE — Patient Instructions (Addendum)
It was nice meeting you today!  Please call for your lung CT to set up an appointment.  We're checking your blood work, I will give you a call when I get the results.  Keep thinking about the flu and pneumonia shots, as well as colonoscopy and EGD.   Please set up an appointment with our acute clinic for a pap smear, and I will see you in about 6 months.

## 2017-01-21 NOTE — Progress Notes (Signed)
CC: blood pressure  HPI:  Cynthia Martinez is a 56 y.o. with a PMH of Hep C in SVR, COPD on intermittent oxygen, HTN, depression presenting to clinic for follow up on her blood pressure and depression.  HTN: Patient was previously on amlodipine 10mg  daily and metoprolol 25mg  daily, however metoprolol was discontinued due to a dry cough. Patient states that she has been doing well on the amlodipine; she denies chest pain, change in her chronic dyspnea on exertion, headaches, vision or hearing changes. She does not check her BP at home.   COPD: Patient followed by Dr. Chase Caller. She is compliant with Dulera and Spiriva; she only has to use her albuterol about 4 times per month. She uses her intermittently when she has had more active days; the oxygen does provide her with relief. She denies cough, sputum production, hemoptysis, fevers, chills, weight loss. She states her appetite has been stable and good since starting mirtazapine. She is to have screening CT chest which was due last year.  MDD, underweight: patient states that she is still having a great response with mirtazapine with control of her appetite and depressive symptoms.   Dysphagia: patient continues to have dysphagia. 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 Patient states that the dysphagia is not progressive and has been stable.   Healthcare maintenance: Patient declines Tdap, flu, and pneumonia vaccinations today. She also has not received her screening colonoscopy; she denies changes in bowel habits, hematochezia, melena, abd pain, weight loss, fevers. Patient declines Pap smear today.   Please see problem based Assessment and Plan for status of patients chronic conditions.  Past Medical History:  Diagnosis Date  . Anemia   . Anxiety   . COPD (chronic obstructive pulmonary disease) ([place])     . Depression   . DUB (dysfunctional uterine bleeding)   . Hep C w/o coma, chronic ([place])   . Hypertension   . Pneumonia due to Haemophilus influenzae ([place]) 09/23/2015  . Shortness of breath dyspnea     Review of Systems:   Review of Systems  Constitutional: Negative for chills, diaphoresis, fever, malaise/fatigue and weight loss.  HENT: Positive for congestion (chronic, well controlled with nasal irrigation). Negative for hearing loss and sinus pain.   Eyes: Negative for blurred vision and double vision.  Respiratory: Positive for wheezing (occasional, controlled with albuterol). Negative for cough, hemoptysis, sputum production and shortness of breath.   Cardiovascular: Negative for chest pain, palpitations and leg swelling.  Gastrointestinal: Negative for abdominal pain, blood in stool, constipation, diarrhea, heartburn, melena, nausea and vomiting.  Genitourinary: Negative for dysuria and frequency.  Musculoskeletal: Negative for myalgias.  Skin: Negative for rash.  Neurological: Negative for dizziness, sensory change, focal weakness, weakness and headaches.  Endo/Heme/Allergies: Negative for polydipsia.  Psychiatric/Behavioral: Negative for depression (well controlled on mirtazapine ).    Physical Exam:  Vitals:   01/21/17 1613  BP: 134/87  Pulse: 89  Temp: 98.5 F (36.9 C)  TempSrc: Oral  SpO2: 100%  Weight: 103 lb 12.8 oz (47.1 kg)  Height: 5\' 3"  (1.6 m)   Physical Exam  Constitutional: She is oriented to person, place, and time. She appears well-developed and well-nourished. No distress.  HENT:  Head: Normocephalic and atraumatic.  Eyes: Conjunctivae and EOM are normal. No scleral icterus.  Neck: Normal range of motion. Neck supple.  Cardiovascular: Normal  rate, regular rhythm, normal heart sounds and intact distal pulses.  Exam reveals no gallop and no friction rub.   No murmur heard. Pulmonary/Chest: Effort normal and breath sounds normal. No respiratory  distress. She has no wheezes. She has no rales.  Abdominal: Soft. Bowel sounds are normal. She exhibits no distension and no mass. There is no tenderness. There is no guarding.  Musculoskeletal: Normal range of motion. She exhibits no edema or tenderness.  Lymphadenopathy:    She has no cervical adenopathy.  Neurological: She is alert and oriented to person, place, and time. She displays normal reflexes. No cranial nerve deficit.  Skin: Skin is warm and dry. She is not diaphoretic. No erythema.  Psychiatric: She has a normal mood and affect. Her behavior is normal. Judgment and thought content normal.    Assessment & Plan:   See Encounters Tab for problem based charting.   Patient discussed with Dr. Andris Baumann, MD Internal Medicine PGY1

## 2017-01-22 LAB — BMP8+ANION GAP
Anion Gap: 18 mmol/L (ref 10.0–18.0)
BUN/Creatinine Ratio: 16 (ref 9–23)
BUN: 11 mg/dL (ref 6–24)
CO2: 22 mmol/L (ref 18–29)
CREATININE: 0.68 mg/dL (ref 0.57–1.00)
Calcium: 9 mg/dL (ref 8.7–10.2)
Chloride: 102 mmol/L (ref 96–106)
GFR, EST AFRICAN AMERICAN: 112 mL/min/{1.73_m2} (ref >59)
GFR, EST NON AFRICAN AMERICAN: 97 mL/min/{1.73_m2} (ref >59)
Glucose: 85 mg/dL (ref 65–99)
POTASSIUM: 4 mmol/L (ref 3.5–5.2)
SODIUM: 142 mmol/L (ref 134–144)

## 2017-01-22 LAB — CBC
HEMATOCRIT: 38.9 % (ref 34.0–46.6)
Hemoglobin: 12.7 g/dL (ref 11.1–15.9)
MCH: 31.3 pg (ref 26.6–33.0)
MCHC: 32.6 g/dL (ref 31.5–35.7)
MCV: 96 fL (ref 79–97)
Platelets: 346 10*3/uL (ref 150–379)
RBC: 4.06 x10E6/uL (ref 3.77–5.28)
RDW: 15.2 % (ref 12.3–15.4)
WBC: 6.5 10*3/uL (ref 3.4–10.8)

## 2017-01-22 LAB — HEPATIC FUNCTION PANEL
ALK PHOS: 60 IU/L (ref 39–117)
ALT: 8 IU/L (ref 0–32)
AST: 15 IU/L (ref 0–40)
Albumin: 4.5 g/dL (ref 3.5–5.5)
BILIRUBIN TOTAL: 0.4 mg/dL (ref 0.0–1.2)
Bilirubin, Direct: 0.13 mg/dL (ref 0.00–0.40)
Total Protein: 6.9 g/dL (ref 6.0–8.5)

## 2017-01-22 LAB — TSH: TSH: 1.75 u[IU]/mL (ref 0.450–4.500)

## 2017-01-23 DIAGNOSIS — J343 Hypertrophy of nasal turbinates: Secondary | ICD-10-CM | POA: Insufficient documentation

## 2017-01-24 ENCOUNTER — Other Ambulatory Visit: Payer: Self-pay | Admitting: Internal Medicine

## 2017-01-24 ENCOUNTER — Other Ambulatory Visit: Payer: Self-pay

## 2017-01-24 DIAGNOSIS — R636 Underweight: Secondary | ICD-10-CM

## 2017-01-24 DIAGNOSIS — F329 Major depressive disorder, single episode, unspecified: Secondary | ICD-10-CM

## 2017-01-24 DIAGNOSIS — F32A Depression, unspecified: Secondary | ICD-10-CM

## 2017-01-24 MED ORDER — MIRTAZAPINE 45 MG PO TABS: 45.0000 mg | ORAL_TABLET | Freq: Every day | ORAL | 3 refills | Status: DC

## 2017-01-24 MED FILL — MIRTAZAPINE 45 MG TABLET: 45 | 30 days supply | Qty: 30 | Fill #0 | Status: TO

## 2017-01-24 NOTE — Telephone Encounter (Signed)
mirtazapine (REMERON) 45 MG tablet, refill request @ [facility].

## 2017-01-25 NOTE — Assessment & Plan Note (Signed)
Patient was previously on amlodipine 10mg  daily and metoprolol 25mg  daily, however metoprolol was discontinued due to a dry cough. Patient states that she has been doing well on the amlodipine; she denies chest pain, change in her chronic dyspnea on exertion, headaches, vision or hearing changes. She does not check her BP at home. BP today is 134/87.  Plan: --continue amlodipine 10mg  daily

## 2017-01-25 NOTE — Assessment & Plan Note (Addendum)
Patient declines Tdap, flu, and pneumonia vaccinations today. She also has not received her screening colonoscopy; she denies changes in bowel habits, hematochezia, melena, abd pain, weight loss, fevers. Patient declines Pap smear today.  Plan: --address at follow up visits again --CBC, Bmet, Liver panel, TSH unremarkable today

## 2017-01-25 NOTE — Assessment & Plan Note (Signed)
Patient followed by Dr. Chase Caller. She is compliant with Dulera and Spiriva; she only has to use her albuterol about 4 times per month. She uses her intermittently when she has had more active days; the oxygen does provide her with relief. She denies cough, sputum production, hemoptysis, fevers, chills, weight loss. She states her appetite has been stable and good since starting mirtazapine. She is to have screening CT chest which was due last year.  Plan: --patient to call about scheduling CT chest - order standing --continue current management --continue monitoring for signs of malignancy

## 2017-01-25 NOTE — Assessment & Plan Note (Signed)
Patient states that she is still having a great response with mirtazapine with control of her appetite and depressive symptoms  Plan: --refill mirtazapine 45mg  daily

## 2017-01-25 NOTE — Assessment & Plan Note (Addendum)
Patient continues to have dysphagia. 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 Patient states that the dysphagia is not progressive and has been stable.  Plan: --patient has standing referral with GI, encouraged her to make another appointment --continue monitoring for progressive symtpoms

## 2017-01-26 NOTE — Progress Notes (Signed)
Case discussed with Dr. Svalina at the time of the visit. We reviewed the resident's history and exam and pertinent patient test results. I agree with the assessment, diagnosis, and plan of care documented in the resident's note. 

## 2017-01-31 ENCOUNTER — Other Ambulatory Visit (INDEPENDENT_AMBULATORY_CARE_PROVIDER_SITE_OTHER): Payer: Medicaid Other

## 2017-01-31 DIAGNOSIS — Z1211 Encounter for screening for malignant neoplasm of colon: Secondary | ICD-10-CM

## 2017-01-31 LAB — POC HEMOCCULT BLD/STL (HOME/3-CARD/SCREEN)
Card #2 Fecal Occult Blod, POC: NEGATIVE
Card #3 Fecal Occult Blood, POC: NEGATIVE
FECAL OCCULT BLD: NEGATIVE

## 2017-01-31 NOTE — Addendum Note (Signed)
Addended by: Truddie Crumble on: 01/31/2017 02:07 PM   Modules accepted: Orders

## 2017-02-12 ENCOUNTER — Other Ambulatory Visit: Payer: Self-pay | Admitting: Internal Medicine

## 2017-02-12 DIAGNOSIS — I10 Essential (primary) hypertension: Secondary | ICD-10-CM

## 2017-02-25 ENCOUNTER — Telehealth: Payer: Self-pay

## 2017-02-25 NOTE — Telephone Encounter (Signed)
Patient calling about hemoccult card results results given to patient negative she denies further questions or concerns

## 2017-04-08 ENCOUNTER — Telehealth: Payer: Self-pay | Admitting: Internal Medicine

## 2017-04-08 NOTE — Telephone Encounter (Signed)
CONFIRMED APT 04/10/17

## 2017-04-10 ENCOUNTER — Ambulatory Visit: Payer: Medicaid Other

## 2017-04-22 ENCOUNTER — Ambulatory Visit: Payer: Medicaid Other

## 2017-04-29 ENCOUNTER — Telehealth: Payer: Self-pay

## 2017-04-29 NOTE — Telephone Encounter (Signed)
Requesting to speak with a nurse about   tiotropium (SPIRIVA HANDIHALER) 18 MCG inhalation capsule. Please call pt back.

## 2017-04-30 ENCOUNTER — Telehealth: Payer: Self-pay

## 2017-04-30 ENCOUNTER — Other Ambulatory Visit: Payer: Self-pay | Admitting: Pharmacist

## 2017-04-30 DIAGNOSIS — J449 Chronic obstructive pulmonary disease, unspecified: Secondary | ICD-10-CM

## 2017-04-30 MED ORDER — TIOTROPIUM BROMIDE MONOHYDRATE 18 MCG IN CAPS: 18.0000 ug | ORAL_CAPSULE | Freq: Every day | RESPIRATORY_TRACT | 2 refills | Status: DC

## 2017-04-30 MED ORDER — MOMETASONE FURO-FORMOTEROL FUM 200-5 MCG/ACT IN AERO: 2.0000 | INHALATION_SPRAY | Freq: Two times a day (BID) | RESPIRATORY_TRACT | 11 refills | Status: DC

## 2017-04-30 NOTE — Telephone Encounter (Signed)
Cynthia Martinez is a 56 y.o. female who was contacted for follow up on COPD medication management.   COPD medications: albuterol (Ventolin HFA) 108 MCG, mometasone-formoterol (DULERA) 200-5MCG, tiotropium (Spiriva Handihaler) 18 MCG  Pt was in good spirits and doing well. Pt reports adherence to all COPD medications. Pt reports taking Spiriva Handihaler once daily in the morning. Pt reports taking DULERA 2 puffs BID. Pt reports requiring rescue inhaler 1-2 times per day. Pt denies having any SOB. Pt denies side effects from COPD medications. Pt reports trouble getting medications refilled. Pt is out of Spiriva Handihaler and can not afford a refill at this time. Pt has received orange card and will be meeting to get enrolled in Med Assist Program tomorrow. Pt was calling to see if she can get a Spiriva sample from the clinic and that she would be willing to pick up sample today. We do not have Spiriva Handihaler samples in clinic but we do have Spiriva Respimat 1.25 or 2.5 MCG inhalers. Will discuss with Dr. Jari Favre if these are acceptable options. Pt denies difficulty affording or picking up medications as her daughter picks up for her. Pt reports stays abstinent from smoking.   Advised patient to contact clinic if concerns or symptoms arise. Patient verbalized understanding.  Maryan Char, PharmD Candidate

## 2017-04-30 NOTE — Telephone Encounter (Signed)
Spiriva Respimat is an acceptable change for her Handihaler for time being while she gets her MAP coverage.  Alphonzo Grieve, MD IMTS - PGY1 Pager [phone]

## 2017-04-30 NOTE — Telephone Encounter (Signed)
This has been resolved

## 2017-04-30 NOTE — Telephone Encounter (Signed)
Patient was contacted with Casey Wells, PharmD candidate. I agree with the assessment and plan of care documented. 

## 2017-06-12 ENCOUNTER — Other Ambulatory Visit: Payer: Self-pay | Admitting: *Deleted

## 2017-06-12 MED ORDER — ALBUTEROL SULFATE HFA 108 (90 BASE) MCG/ACT IN AERS: 2.0000 | INHALATION_SPRAY | RESPIRATORY_TRACT | 6 refills | Status: DC | PRN

## 2017-06-25 MED FILL — MIRTAZAPINE 45 MG TABS: 45 | 30 days supply | Qty: 30 | Fill #0

## 2017-08-21 MED FILL — MIRTAZAPINE 45 MG TABS: 45 | 30 days supply | Qty: 30 | Fill #1

## 2017-09-16 ENCOUNTER — Ambulatory Visit: Payer: Self-pay

## 2017-09-22 ENCOUNTER — Ambulatory Visit (INDEPENDENT_AMBULATORY_CARE_PROVIDER_SITE_OTHER): Payer: Self-pay | Admitting: Internal Medicine

## 2017-09-22 DIAGNOSIS — R000 Tachycardia, unspecified: Secondary | ICD-10-CM

## 2017-09-22 DIAGNOSIS — R21 Rash and other nonspecific skin eruption: Secondary | ICD-10-CM

## 2017-09-22 DIAGNOSIS — Z87891 Personal history of nicotine dependence: Secondary | ICD-10-CM

## 2017-09-22 DIAGNOSIS — L814 Other melanin hyperpigmentation: Secondary | ICD-10-CM

## 2017-09-22 DIAGNOSIS — Z79899 Other long term (current) drug therapy: Secondary | ICD-10-CM

## 2017-09-22 DIAGNOSIS — I10 Essential (primary) hypertension: Secondary | ICD-10-CM

## 2017-09-22 DIAGNOSIS — Z840 Family history of diseases of the skin and subcutaneous tissue: Secondary | ICD-10-CM

## 2017-09-22 MED ORDER — TRIAMCINOLONE ACETONIDE 0.1 % EX OINT: 1.0000 "application " | TOPICAL_OINTMENT | Freq: Two times a day (BID) | CUTANEOUS | 0 refills | Status: DC

## 2017-09-22 MED ORDER — METOPROLOL TARTRATE 25 MG PO TABS: 25.0000 mg | ORAL_TABLET | Freq: Two times a day (BID) | ORAL | 11 refills | Status: DC

## 2017-09-22 MED ORDER — TRIAMCINOLONE ACETONIDE 0.5 % EX OINT: 1.0000 "application " | TOPICAL_OINTMENT | Freq: Two times a day (BID) | CUTANEOUS | 0 refills | Status: DC

## 2017-09-22 NOTE — Assessment & Plan Note (Signed)
Thick hyperpigmented plaque over left elbow somewhat consistent with psoriasis. Denies any other symptoms. Lesion over left ankle more consistent with eczema. Will do trial of high intensity steroid cream. Follow up in 2-3 weeks. If no improvement will need biopsy.

## 2017-09-22 NOTE — Progress Notes (Signed)
   CC: Rash on right elbow  HPI:  Ms.Deleah D Janelle is a 56 y.o. female with a past medical history listed below here today with complaints of rash on her right elbow.  She reports that for the past 2-3 month she has had a rash over her left elbow. Reports that initially she thought it was eczema and tried using Vaseline on it without any improvement. Denies any pruritis other than after getting out of the shower. Notes some flaking when she scratches at it. Otherwise, denies any symptoms. No joint aches or pains. Does note additional rash over the lateral aspect of her left ankle. Reports her son has eczema. Denies any family history of psoriasis.   Past Medical History:  Diagnosis Date  . Anemia   . Anxiety   . COPD (chronic obstructive pulmonary disease) ([place])   . Depression   . DUB (dysfunctional uterine bleeding)   . Hep C w/o coma, chronic ([place])   . Hypertension   . Pneumonia due to Haemophilus influenzae ([place]) 09/23/2015  . Shortness of breath dyspnea    Review of Systems:   No chest pain or shortness of breath  Physical Exam:  Vitals:   09/22/17 1340  BP: (!) 161/91  Pulse: (!) 107  Temp: 98.2 F (36.8 C)  SpO2: 99%  Weight: 101 lb 11.2 oz (46.1 kg)  Height: 5\' 3"  (1.6 m)   GENERAL- alert, co-operative, appears as stated age, not in any distress. CARDIAC- RRR, no murmurs, rubs or gallops. RESP- Moving equal volumes of air, and clear to auscultation bilaterally, no wheezes or crackles. ABDOMEN- Soft, nontender, bowel sounds present. SKIN-  Hyperpigmented, scaly rash over right elbow. Some flaking skin. No erythema or warmth. Large flat more eczematid lesion over left ankle.  PSYCH- Normal mood and affect, appropriate thought content and speech.   Assessment & Plan:   See Encounters Tab for problem based charting.  Patient discussed with Dr. Daryll Drown

## 2017-09-22 NOTE — Assessment & Plan Note (Signed)
BP Readings from Last 3 Encounters:  09/22/17 (!) 161/91  01/21/17 134/87  01/12/16 129/82    Lab Results  Component Value Date   NA 142 01/21/2017   K 4.0 01/21/2017   CREATININE 0.68 01/21/2017   Currently only on amlodipine 10 mg daily. BP elevated today and patient is mildly tachycardic. Appears to have a history of sinus tachycardia.   A/P: Re-start metoprolol 25 mg bid. Follow up in 2-3 weeks.

## 2017-09-22 NOTE — Patient Instructions (Signed)
Ms. Cynthia Martinez,  I am sending in a steroid ointment to use on your rash. Use it twice a day. I am also going to start you back on metoprolol 25 mg twice a day.  Follow up with me in 2-3 weeks.

## 2017-09-24 NOTE — Progress Notes (Signed)
Internal Medicine Clinic Attending  I saw and evaluated the patient.  I personally confirmed the key portions of the history and exam documented by Dr. Boswell and I reviewed pertinent patient test results.  The assessment, diagnosis, and plan were formulated together and I agree with the documentation in the resident's note. 

## 2017-10-20 MED FILL — MIRTAZAPINE 45 MG TABS: 45 | 30 days supply | Qty: 30 | Fill #2

## 2017-11-13 ENCOUNTER — Ambulatory Visit: Payer: Self-pay

## 2017-11-24 ENCOUNTER — Ambulatory Visit: Payer: Self-pay

## 2017-12-17 ENCOUNTER — Ambulatory Visit: Payer: Self-pay

## 2017-12-17 ENCOUNTER — Encounter: Payer: Self-pay | Admitting: Internal Medicine

## 2017-12-29 ENCOUNTER — Other Ambulatory Visit: Payer: Self-pay | Admitting: Internal Medicine

## 2017-12-29 DIAGNOSIS — R636 Underweight: Secondary | ICD-10-CM

## 2017-12-29 MED FILL — MIRTAZAPINE 45 MG TABS: 45 | 30 days supply | Qty: 30 | Fill #0

## 2018-01-14 ENCOUNTER — Encounter: Payer: Self-pay | Admitting: Internal Medicine

## 2018-02-03 NOTE — Progress Notes (Deleted)
   CC: ***  HPI:  [name] is a 57 y.o. with a PMH of ***  Please see problem based Assessment and Plan for status of patients chronic conditions.  Past Medical History:  Diagnosis Date  . Anemia   . Anxiety   . COPD (chronic obstructive pulmonary disease) ([place])   . Depression   . DUB (dysfunctional uterine bleeding)   . Hep C w/o coma, chronic ([place])   . Hypertension   . Pneumonia due to Haemophilus influenzae ([place]) 09/23/2015  . Shortness of breath dyspnea     Review of Systems:   ROS  Physical Exam:  There were no vitals filed for this visit. GENERAL- alert, co-operative, appears as stated age, not in any distress. HEENT- Atraumatic, normocephalic, PERRL, EOMI, oral mucosa appears moist CARDIAC- RRR, no murmurs, rubs or gallops. RESP- Moving equal volumes of air, and clear to auscultation bilaterally, no wheezes or crackles. ABDOMEN- Soft, nontender, bowel sounds present. NEURO- No obvious Cr N abnormality. EXTREMITIES- pulse 2+, symmetric, no pedal edema. SKIN- Warm, dry, No rash or lesion. PSYCH- Normal mood and affect, appropriate thought content and speech.  Assessment & Plan:   See Encounters Tab for problem based charting.   Patient {GC/GE:[id number]::"discussed with","seen with"} Dr. {NAMES:[id number]::"Butcher","Granfortuna","E. Hoffman","Klima","Mullen","Narendra","Vincent"}   Alphonzo Grieve, MD Internal Medicine PGY2

## 2018-02-04 ENCOUNTER — Encounter: Payer: Self-pay | Admitting: Internal Medicine

## 2018-04-27 ENCOUNTER — Telehealth: Payer: Self-pay

## 2018-04-27 NOTE — Telephone Encounter (Signed)
Ms. Bergren is a 34 YOF with COPD. Patient was called to check on her inhaler usage and COPD management.  Patient was feeling well today. She still uses Dulera 200/5 2puffs BID Spiriva handihaler 1 puff QD Albuterol (proventil) prn- approximately 2-3x/week.  Inquired about refills. She needs Proventil, which we can provide a sample. She was running out of the [facility] which we do not have samples of in clinic. Notified patient we will send in new refill to pharmacy.  She is currently in between having her Medicare part D coverage start. She is no longer being covered with the [place].   She had no questions or concerns today and was advised if she did have concerns to call the clinic. Patient is making new appointment for clinic follow- up.

## 2018-04-28 ENCOUNTER — Other Ambulatory Visit: Payer: Self-pay | Admitting: Pharmacist

## 2018-04-28 DIAGNOSIS — J449 Chronic obstructive pulmonary disease, unspecified: Secondary | ICD-10-CM

## 2018-04-28 DIAGNOSIS — I10 Essential (primary) hypertension: Secondary | ICD-10-CM

## 2018-04-28 DIAGNOSIS — R636 Underweight: Secondary | ICD-10-CM

## 2018-04-29 MED ORDER — MOMETASONE FURO-FORMOTEROL FUM 200-5 MCG/ACT IN AERO
2.0000 | INHALATION_SPRAY | Freq: Two times a day (BID) | RESPIRATORY_TRACT | 2 refills | Status: DC
Start: 2018-04-29 — End: 2019-03-31

## 2018-04-29 MED ORDER — TIOTROPIUM BROMIDE MONOHYDRATE 18 MCG IN CAPS: 18.0000 ug | ORAL_CAPSULE | Freq: Every day | RESPIRATORY_TRACT | 2 refills | Status: DC

## 2018-04-29 MED ORDER — MIRTAZAPINE 15 MG PO TABS: 15.0000 mg | ORAL_TABLET | Freq: Every day | ORAL | 0 refills | Status: DC

## 2018-04-29 MED ORDER — AMLODIPINE BESYLATE 10 MG PO TABS: 10.0000 mg | ORAL_TABLET | Freq: Every day | ORAL | 2 refills | Status: DC

## 2018-04-30 MED FILL — MIRTAZAPINE 15 MG TAB: 15 | 30 days supply | Qty: 60 | Fill #0

## 2018-04-30 MED FILL — AMLODIPINE BESYLATE 10 MG T: 10 | 30 days supply | Qty: 30 | Fill #0

## 2018-04-30 MED FILL — DULERA 200 MCG/5 MCG INH: 200-5 | 30 days supply | Qty: 13 | Fill #0

## 2018-04-30 MED FILL — SPIRIVA 18 MCG CP-HANDIHALE: 18 | 30 days supply | Qty: 30 | Fill #0

## 2018-06-03 ENCOUNTER — Encounter: Payer: Self-pay | Admitting: Internal Medicine

## 2018-06-09 MED FILL — SPIRIVA 18 MCG CP-HANDIHALE: 18 | 30 days supply | Qty: 30 | Fill #1

## 2018-06-15 MED FILL — AMLODIPINE BESYLATE 10 MG T: 10 | 30 days supply | Qty: 30 | Fill #1

## 2018-07-29 ENCOUNTER — Encounter: Payer: Self-pay | Admitting: Internal Medicine

## 2018-08-06 ENCOUNTER — Ambulatory Visit (INDEPENDENT_AMBULATORY_CARE_PROVIDER_SITE_OTHER): Payer: Medicare Other | Admitting: Internal Medicine

## 2018-08-06 ENCOUNTER — Encounter: Payer: Self-pay | Admitting: Internal Medicine

## 2018-08-06 VITALS — BP 141/85 | HR 77 | Temp 97.7°F | Wt 84.0 lb

## 2018-08-06 DIAGNOSIS — F3289 Other specified depressive episodes: Secondary | ICD-10-CM

## 2018-08-06 DIAGNOSIS — R5383 Other fatigue: Secondary | ICD-10-CM | POA: Diagnosis not present

## 2018-08-06 DIAGNOSIS — Z7951 Long term (current) use of inhaled steroids: Secondary | ICD-10-CM | POA: Diagnosis not present

## 2018-08-06 DIAGNOSIS — Z87891 Personal history of nicotine dependence: Secondary | ICD-10-CM | POA: Diagnosis not present

## 2018-08-06 DIAGNOSIS — Z681 Body mass index (BMI) 19 or less, adult: Secondary | ICD-10-CM | POA: Diagnosis not present

## 2018-08-06 DIAGNOSIS — Z114 Encounter for screening for human immunodeficiency virus [HIV]: Secondary | ICD-10-CM | POA: Diagnosis not present

## 2018-08-06 DIAGNOSIS — R636 Underweight: Secondary | ICD-10-CM

## 2018-08-06 DIAGNOSIS — Z79899 Other long term (current) drug therapy: Secondary | ICD-10-CM

## 2018-08-06 DIAGNOSIS — J449 Chronic obstructive pulmonary disease, unspecified: Secondary | ICD-10-CM

## 2018-08-06 DIAGNOSIS — Z1239 Encounter for other screening for malignant neoplasm of breast: Secondary | ICD-10-CM

## 2018-08-06 DIAGNOSIS — D510 Vitamin B12 deficiency anemia due to intrinsic factor deficiency: Secondary | ICD-10-CM

## 2018-08-06 DIAGNOSIS — R634 Abnormal weight loss: Secondary | ICD-10-CM

## 2018-08-06 DIAGNOSIS — F329 Major depressive disorder, single episode, unspecified: Secondary | ICD-10-CM

## 2018-08-06 DIAGNOSIS — I10 Essential (primary) hypertension: Secondary | ICD-10-CM

## 2018-08-06 DIAGNOSIS — R1310 Dysphagia, unspecified: Secondary | ICD-10-CM

## 2018-08-06 MED ORDER — SERTRALINE HCL 50 MG PO TABS: 50.0000 mg | ORAL_TABLET | Freq: Every day | ORAL | 2 refills | Status: DC

## 2018-08-06 MED FILL — SERTRALINE HCL 50 MG TABLET: 50 | 30 days supply | Qty: 30 | Fill #0

## 2018-08-06 NOTE — Progress Notes (Signed)
   CC: fatigue  HPI:  [name] is a 57 y.o. with a PMH of COPD, dysphagia, HTN, MDD, pernicious anemia presenting to clinic for weight loss and fatigue.  Since her last visit with me, patient has had significant life events; just over the summer she experienced the death of her father, her uncle and her fiancee. She was primary caregiver for her fiancee who was suffering from cancer for months prior to his death. Due to these circumstances she states that she had been focusing on everyone else and not on her health; she reports eating mainly one meal a day at night which was usually fast food. She has been utilizing grief counseling through hospice and has found it very helpful.  She reports progressive weight loss, associated with decreased appetite. She denies abd pain, nausea, vomiting, diarrhea, constipation, acid reflux, melena, hematochezia, fevers, chills, nightsweats, lymphadenopathy. She does have dysphagia which she states is to pills only (previous barrium swallow concerning for stricture, patient declined EGD).   She states that her breathing is better that at last visit; she is compliant with dulera and spiriva; uses albuterol about 3 times a day, and rarely needs oxygen with exertion - only when she "works herself up". She denies cough, chest pain, hemoptysis. She has not seen Dr. Chase Caller in over 2 years and has not completed the f/u CT chest.   She was previously on mirtazepine for weight gain and help with sleep which at prior visit seemed to be helpful; she has restarted it in the last few months but now feels that it is causing her restless legs at night which keeps her up and is not helping her appetite.  She has not been taking B12 supplementation for some months now.   Please see problem based Assessment and Plan for status of patients chronic conditions.  Past Medical History:  Diagnosis Date  . Anemia   . Anxiety   . COPD (chronic obstructive pulmonary  disease) ([place])   . Depression   . DUB (dysfunctional uterine bleeding)   . Hep C w/o coma, chronic ([place])   . Hypertension   . Pneumonia due to Haemophilus influenzae ([place]) 09/23/2015  . Shortness of breath dyspnea     Review of Systems:   Per HPI  Physical Exam:  Vitals:   08/06/18 1100  BP: (!) 141/85  Pulse: 77  Temp: 97.7 F (36.5 C)  TempSrc: Oral  SpO2: 99%  Weight: 84 lb (38.1 kg)   GENERAL- alert, co-operative, appears as stated age, not in any distress, cachectic. HEENT- Atraumatic, normocephalic, EOMI, oral mucosa appears moist, no thyromegaly, no lymphadenopathy. CARDIAC- RRR, no murmurs, rubs or gallops. RESP- Moving equal volumes of air, and clear to auscultation bilaterally, no wheezes or crackles. ABDOMEN- Soft, nontender, bowel sounds present. NEURO- CN 2-12 grossly intact. EXTREMITIES- pulse 2+, symmetric, no pedal edema, decreased muscle mass throughout. SKIN- Warm, dry, no rash or lesion. PSYCH- Normal mood and affect, appropriate thought content and speech.  Assessment & Plan:   See Encounters Tab for problem based charting.   Patient discussed with Dr. Abelardo Diesel, MD Internal Medicine PGY-3

## 2018-08-06 NOTE — Patient Instructions (Signed)
Start zoloft 50mg  daily Please set up appointment with pulmonology

## 2018-08-07 ENCOUNTER — Encounter: Payer: Self-pay | Admitting: Internal Medicine

## 2018-08-07 LAB — BMP8+ANION GAP
Anion Gap: 13 mmol/L (ref 10.0–18.0)
BUN/Creatinine Ratio: 13 (ref 9–23)
BUN: 8 mg/dL (ref 6–24)
CALCIUM: 9.3 mg/dL (ref 8.7–10.2)
CO2: 26 mmol/L (ref 20–29)
CREATININE: 0.62 mg/dL (ref 0.57–1.00)
Chloride: 103 mmol/L (ref 96–106)
GFR calc Af Amer: 114 mL/min/{1.73_m2} (ref >59)
GFR calc non Af Amer: 99 mL/min/{1.73_m2} (ref >59)
Glucose: 88 mg/dL (ref 65–99)
Potassium: 4 mmol/L (ref 3.5–5.2)
Sodium: 142 mmol/L (ref 134–144)

## 2018-08-07 LAB — CBC WITH DIFFERENTIAL/PLATELET
Basophils Absolute: 0 10*3/uL (ref 0.0–0.2)
Basos: 0 %
EOS (ABSOLUTE): 0 10*3/uL (ref 0.0–0.4)
EOS: 0 %
HEMATOCRIT: 34.3 % (ref 34.0–46.6)
HEMOGLOBIN: 11.7 g/dL (ref 11.1–15.9)
Immature Grans (Abs): 0 10*3/uL (ref 0.0–0.1)
Immature Granulocytes: 0 %
LYMPHS ABS: 1.4 10*3/uL (ref 0.7–3.1)
Lymphs: 21 %
MCH: 37 pg — ABNORMAL HIGH (ref 26.6–33.0)
MCHC: 34.1 g/dL (ref 31.5–35.7)
MCV: 109 fL — ABNORMAL HIGH (ref 79–97)
Monocytes Absolute: 0.4 10*3/uL (ref 0.1–0.9)
Monocytes: 6 %
NEUTROS ABS: 4.7 10*3/uL (ref 1.4–7.0)
Neutrophils: 73 %
Platelets: 266 10*3/uL (ref 150–450)
RBC: 3.16 x10E6/uL — ABNORMAL LOW (ref 3.77–5.28)
RDW: 19 % — ABNORMAL HIGH (ref 12.3–15.4)
WBC: 6.5 10*3/uL (ref 3.4–10.8)

## 2018-08-07 LAB — FERRITIN: Ferritin: 203 ng/mL — ABNORMAL HIGH (ref 15–150)

## 2018-08-07 LAB — HEPATIC FUNCTION PANEL
ALBUMIN: 4.5 g/dL (ref 3.5–5.5)
ALK PHOS: 52 IU/L (ref 39–117)
ALT: 11 IU/L (ref 0–32)
AST: 16 IU/L (ref 0–40)
BILIRUBIN TOTAL: 1 mg/dL (ref 0.0–1.2)
BILIRUBIN, DIRECT: 0.25 mg/dL (ref 0.00–0.40)
TOTAL PROTEIN: 6.7 g/dL (ref 6.0–8.5)

## 2018-08-07 LAB — VITAMIN B12: Vitamin B-12: 156 pg/mL — ABNORMAL LOW (ref 232–1245)

## 2018-08-07 LAB — HIV ANTIBODY (ROUTINE TESTING W REFLEX): HIV SCREEN 4TH GENERATION: NONREACTIVE

## 2018-08-07 MED ORDER — VITAMIN B-12 1000 MCG PO TABS: 1000.0000 ug | ORAL_TABLET | Freq: Every day | ORAL | 3 refills | Status: DC

## 2018-08-07 NOTE — Assessment & Plan Note (Signed)
Patient with obstructive lung findings after multilobar pneumonia several years ago; unfortunately she has not had full PFTs to fully assess. She has been followed by Dr. Chase Caller but hasn't seen him in over 2 years.   She reports overall good control of her symptoms with dulera and spiriva; uses albuterol ~3 times per month and rarely uses oxygen any longer.   On exam her lung sounds are a little diminished throughout but clear w/o wheezing or rales; no increased work of breathing  Plan: --continue dulera and spiriva, albuterol prn, oxygen prn --advised to scheduled appt with pulm for f/u; will need PFTs and repeat CT

## 2018-08-07 NOTE — Assessment & Plan Note (Addendum)
Underweight patient who has lost about 15lbs since last visit in setting of taking care of ailing family members and grief. No other red-flag symptoms however she has not had appropriate cancer screening for her age due to inconsistent follow up and declining these tests in the past.   She does not want to take mirtazepine any longer due to ?correlation to restless legs.  Will work on age appropriate screening, f/u with pulmonary, and hopefully addressing the dysphagia with EGD if she is in agreement during follow up appts.  Plan: --HIV negative --CBC - macrocytic cells w/o anemia currently - likely related to vit B12 def --bmet with unremarkable electrolytes and renal function --liver panel unremarkable --ordered mammogram --she declined pap smear today --start zoloft to address anxiety and depression; once optimized dosing of antidepressant, continued low weight w/o gain, will consider marinol

## 2018-08-07 NOTE — Assessment & Plan Note (Signed)
Patient with PHQ9 score of 11 and GAD7 score of 15 today with symptoms lasting >31mos. Interested in different therapy from mirtazepine due to ?restless legs.  Plan: --start zoloft 50mg  daily; f/u in 1 month to re-assess symptoms and possibly increase dose

## 2018-08-07 NOTE — Assessment & Plan Note (Signed)
Patient w/ history of B12 deficiency with positive intrinsic factor antibodies who in the past has responded adequately to just PO supplementation. She has not been taking supplementation for some months now.   Plan: --advised to restart vit B 12 supplementation --CBC - Hgb stable but macrocytic with inc RDW and mildly low  --vit B 12 level low at 156

## 2018-08-10 NOTE — Progress Notes (Signed)
Internal Medicine Clinic Attending  Case discussed with Dr. Svalina  at the time of the visit.  We reviewed the resident's history and exam and pertinent patient test results.  I agree with the assessment, diagnosis, and plan of care documented in the resident's note.  

## 2018-08-21 ENCOUNTER — Telehealth: Payer: Self-pay | Admitting: *Deleted

## 2018-08-21 MED ORDER — UMECLIDINIUM BROMIDE 62.5 MCG/INH IN AEPB: 1.0000 | INHALATION_SPRAY | Freq: Every day | RESPIRATORY_TRACT | 11 refills | Status: DC

## 2018-08-21 NOTE — Telephone Encounter (Signed)
Received fax from [facility] requesting alternative to Spiriva which is not covered by insurance. The following are covered: Advair, Breo, Incruse, Anoro. Hubbard Hartshorn, RN, BSN

## 2018-08-21 NOTE — Telephone Encounter (Signed)
Changed Spiriva to Incruse daily.  Alphonzo Grieve, MD IMTS - PGY3

## 2018-08-31 MED FILL — AMLODIPINE BESYLATE 10 MG T: 10 | 30 days supply | Qty: 30 | Fill #2

## 2018-09-02 ENCOUNTER — Telehealth: Payer: Self-pay | Admitting: Internal Medicine

## 2018-09-02 MED FILL — INCRUSE ELLIPTA 62.5 MCG IN: 62.5 | 30 days supply | Qty: 30 | Fill #0

## 2018-09-02 NOTE — Telephone Encounter (Signed)
Pt stated she has 1 and 1/2 box of Spiriva left; she did not know rx for Incruse was sent to the pharmacy b/c Spiriva is not covered by her insurance. She wanted to know if t's ok to finish the Spiriva she has - I told her it would be ok to used what she has then start using Incruse.

## 2018-09-02 NOTE — Telephone Encounter (Signed)
I agree, thank you!  Cynthia Martinez 

## 2018-09-02 NOTE — Telephone Encounter (Signed)
Pt would like physician call regarding inhlaers, pt contact [phone]

## 2018-09-07 ENCOUNTER — Ambulatory Visit: Payer: Medicare Other | Admitting: Internal Medicine

## 2018-09-16 ENCOUNTER — Encounter: Payer: Self-pay | Admitting: Internal Medicine

## 2018-09-16 ENCOUNTER — Ambulatory Visit (INDEPENDENT_AMBULATORY_CARE_PROVIDER_SITE_OTHER): Payer: Medicare Other | Admitting: Internal Medicine

## 2018-09-16 ENCOUNTER — Other Ambulatory Visit (INDEPENDENT_AMBULATORY_CARE_PROVIDER_SITE_OTHER): Payer: Medicare Other

## 2018-09-16 VITALS — BP 112/60 | HR 115 | Ht 63.0 in | Wt 86.2 lb

## 2018-09-16 DIAGNOSIS — Z983 Post therapeutic collapse of lung status: Secondary | ICD-10-CM | POA: Diagnosis not present

## 2018-09-16 DIAGNOSIS — R634 Abnormal weight loss: Secondary | ICD-10-CM

## 2018-09-16 DIAGNOSIS — B370 Candidal stomatitis: Secondary | ICD-10-CM

## 2018-09-16 DIAGNOSIS — J449 Chronic obstructive pulmonary disease, unspecified: Secondary | ICD-10-CM | POA: Diagnosis not present

## 2018-09-16 DIAGNOSIS — J9811 Atelectasis: Secondary | ICD-10-CM

## 2018-09-16 DIAGNOSIS — Z114 Encounter for screening for human immunodeficiency virus [HIV]: Secondary | ICD-10-CM

## 2018-09-16 LAB — TSH: TSH: 0.95 u[IU]/mL (ref 0.35–4.50)

## 2018-09-16 LAB — T4, FREE: Free T4: 0.61 ng/dL (ref 0.60–1.60)

## 2018-09-16 MED ORDER — NYSTATIN 100000 UNIT/ML MT SUSP: 5.0000 mL | Freq: Four times a day (QID) | OROMUCOSAL | 0 refills | Status: DC

## 2018-09-16 NOTE — Patient Instructions (Addendum)
COPD, severe ([place])  - do full PFt  - continue current inhalers  Oral thrush # Oral thrush - For Oral thrush: Take Suspension (swish and swallow): 500,000 units 4 times/day for 5 days; swish in the mouth and retain for as long as possible (several minutes) before swallowing. IF nystatin is in back order: alternatives are  A) clotrimazole troche (throt lozenge) 10mg  dissolved and swish and swallow 5 times a day for 14 days. Or B  Collapse of right lung in CT march 2017 - do CT chest without contrast  Weight loss, unintentional - check TSH, FT4 and HIV  Followup  - return to see app next few weeks but after completing above

## 2018-09-16 NOTE — Progress Notes (Addendum)
OV 12/27/2015  Chief Complaint  Patient presents with  . Pulmonary Consult    Pt last seen in 09/2012. Pt states her breathing has worsened since last seen. Pt c/o prod cough with yellow mucus and chest pain when coughing.    PI PCP is Santa Lighter, MD  Body mass index is 18.03 kg/(m^2).  reports that she quit smoking about 3 months ago. AGe 59 to 66; 1/2 ppd IOV 09/28/2012  57 year old female c.o chronic cough. She is worried she might have infection in lung. She feels better only after coughing and bringing out mucus; this is facilitated by inhalers  Cough x insidious onset x 1 year ago.  Stable course. Severit is moderate. Quality of cough: sleeps well "like a baby" and early in the morning she will have cough with mucus that is facilitaed by albuterol mdi. Post early morning cough: no further cough. Cough is present mostly only early in the morning and hardly ever for rest of the day.  No nocturnal cough. Rx with mdi (albuterold), prednisone, antibiotic courses, GERD but none of this has helped. HOwever, per her hx ppi course was only for 7 days and prednisone was only for 9 days.   Cough worsened by car heater, early in the morning and fiance's cig smoke (periodically exposed to his smoke). Cough is better by inhaler, passage of the day, absence of cig smoke exposure and open ventilation.. No associated wheeze, tickle in throat, gag.  In addition, there is associated dyspnea. Dyspnea correlated with cough but can also be exertional; 1 flight of steps. Rest improves dyspnea   Cough Relevant hx  BP  - on amlodipine for bp for 1 year  - on lopressor since oct 2013  Sinus  - denies sinus issues but has mild runny nose  - denies anosmia or blockage  Allergies  - denies seasonal or perennnial environmental allergies  Exposure  - cig smoke periodically from boyfriend currently - she is an ex-smoker   GI  - denies GERD. Has tried PPI for cough but did not help  cough  Pulmonaary - She is on advair x 3 months for dyspnea and cough (as above) but of no help with cough but has helped dyspnea - ALbuterol helps short period with cough - CXR 08/15/12 - clear (never had CT chest) - 09/28/2012 office spirometry - fevq 0.8L/36%, R 36 and c/w SEVERE OBSTRUCTION - Walking desaturation test on 09/28/2012 185 feet x 3 laps:  did NOT desaturate. Rest pulse ox was 91%, final pulse ox was 96%. HR response 86/min at rest to 96/min at peak     [facility] Please have full PFT breathing test next 48h  Once the technician completes test, please remind them to page me with result  Further instructions based on result  OV 10/09/2012  pft 09/30/12 shows severe copd. fev1 0.9L/41%, rato 42, DLCO 14/58%. SHe is here to discuss results. No interim events  Past, Family, Social reviewed: no change since last visit   New visit December 27, 2015 because it has been more than 3 years since I last saw her  - 57 year old African-American female last seen over 3 years ago with Gold stage III COPD. After that lost to follow-up. According to her history she is a limited smoker but not sure this history is true because my nurse elicited only a 4.5 pack smoking history today but 3 years ago review of the records show that she give me a  12-1/2 pack smoking history. She tells me her in the last 3 years overall she was doing well but she's gotten disabled and unable to work because of her COPD. Then in November and December 2016 she reports to admissions for pneumonia. Review of the record shows that the December 2016 admission forr Haemophilus influenza pneumonia  on the lower lobe as visualized on the CT scan of the chest that I saw  personally. Since then she's been more deconditioned. She has class III-for dyspnea on exertion not much cough. She says that without oxygen she desaturates easily to 60%. She's been on oxygen since November 2016 at 2 L nasal cannula. Currently she is improved since  of December 2016 admission with still quite symptomatic. She has declined flu shot and pneumonia shot for unclear reasons. Although she has had hepatitis vaccines. She has never been to pulmonary rehabilitation. She is asking about applying for disability.   OV 09/16/2018  Subjective:  Patient ID: [name], female , DOB: 07-28-1961 , age 19 y.o. , MRN: [id number] , ADDRESS: [place] [id number]   09/16/2018 -   Chief Complaint  Patient presents with  . Follow-up    patient would like to talk about weight loss      HPI [name] 57 y.o. -advanced COPD patient on triple inhaler therapy.  Personally not seen in over 2 1/2 years.  She said that she been taking care of her boyfriend with pancreatic cancer who finally died from the illness in August 12, 2018.  In addition in June 2019 she lost an uncle to prostate cancer and her dad and July 2019 to lung cancer.  Because of all this she says she is lost a lot of weight and she is unintentional weight loss.  She did see primary care physician in September 2019.  Chart reviewed and results reviewed and there is normal chemistries and CBC profile.  However I do not see any thyroid function studies or HIV testing for weight loss.  She was okay getting this test done.  Last HIV testing was in 2015 and was negative.  At this point in time she says her breathing is stable.  She is doing well on triple inhaler therapy.  In fact her COPD CAT score is 3.  The main issue appears to be weight loss.  On exam she seems to have oral thrush.  She attributes this to [facility].     CAT COPD Symptom & Quality of Life Score (GSK trademark) 0 is no burden. 5 is highest burden 09/16/2018   Never Cough -> Cough all the time 2  No phlegm in chest -> Chest is full of phlegm 1  No chest tightness -> Chest feels very tight 0  No dyspnea for 1 flight stairs/hill -> Very dyspneic for 1 flight of stairs 0  No limitations for ADL at home -> Very  limited with ADL at home 0  Confident leaving home -> Not at all confident leaving home 0  Sleep soundly -> Do not sleep soundly because of lung condition 0  Lots of Energy -> No energy at all 0  TOTAL Score (max 40)  3     ROS - per HPI     has a past medical history of Anemia, Anxiety, COPD (chronic obstructive pulmonary disease) ([place]), Depression, DUB (dysfunctional uterine bleeding), Hep C w/o coma, chronic ([place]), Hypertension, Pneumonia due to Haemophilus influenzae ([place]) (09/23/2015), and Shortness of breath dyspnea.  reports that she quit smoking about 6 years ago. Her smoking use included cigarettes. She has a 4.50 pack-year smoking history. She has never used smokeless tobacco.  Past Surgical History:  Procedure Laterality Date  . POLYPECTOMY      Allergies  Allergen Reactions  . Hydrochlorothiazide Hives  . Penicillins Hives    Tolerated ceftriaxone  Has patient had a PCN reaction causing immediate rash, facial/tongue/throat swelling, SOB or lightheadedness with hypotension:YES Has patient had a PCN reaction causing severe rash involving mucus membranes or skin necrosis: NO Has patient had a PCN reaction that required hospitalization NO Has patient had a PCN reaction occurring within the last 10 years: NO If all of the above answers are "NO", then may proceed with Cephalosporin use.   . Sulfa Antibiotics Hives    Immunization History  Administered Date(s) Administered  . Hepatitis A 05/10/2013  . Hepatitis A, Adult 08/19/2014  . Hepatitis B 05/10/2013  . Hepatitis B, ped/adol 08/19/2014, 08/04/2015    Family History  Problem Relation Age of Onset  . Diabetes Unknown      Current Outpatient Medications:  .  albuterol (VENTOLIN HFA) 108 (90 Base) MCG/ACT inhaler, Inhale 2 puffs into the lungs every 4 (four) hours as needed for wheezing., Disp: 18 g, Rfl: 6 .  amLODipine (NORVASC) 10 MG tablet, Take 1 tablet (10 mg total) by mouth daily., Disp: 30 tablet,  Rfl: 2 .  budesonide (PULMICORT) 0.5 MG/2ML nebulizer solution, 0.5 mg., Disp: , Rfl:  .  cetirizine (ZYRTEC) 10 MG chewable tablet, Chew 1 tablet (10 mg total) by mouth daily., Disp: , Rfl:  .  fluticasone (FLONASE) 50 MCG/ACT nasal spray, Place 2 sprays into both nostrils daily., Disp: 16 g, Rfl: 2 .  metoprolol tartrate (LOPRESSOR) 25 MG tablet, Take 1 tablet (25 mg total) 2 (two) times daily by mouth., Disp: 60 tablet, Rfl: 11 .  mometasone-formoterol (DULERA) 200-5 MCG/ACT AERO, Inhale 2 puffs into the lungs 2 (two) times daily. [facility], Disp: 1 Inhaler, Rfl: 2 .  umeclidinium bromide (INCRUSE ELLIPTA) 62.5 MCG/INH AEPB, Inhale 1 puff into the lungs daily., Disp: 30 each, Rfl: 11 .  vitamin B-12 (CYANOCOBALAMIN) 1000 MCG tablet, Take 1 tablet (1,000 mcg total) by mouth daily., Disp: 90 tablet, Rfl: 3      Objective:   Vitals:   09/16/18 0956  BP: 112/60  Pulse: (!) 115  SpO2: 98%  Weight: 86 lb 3.2 oz (39.1 kg)  Height: 5\' 3"  (1.6 m)    Estimated body mass index is 15.27 kg/m as calculated from the following:   Height as of this encounter: 5\' 3"  (1.6 m).   Weight as of this encounter: 86 lb 3.2 oz (39.1 kg).  @WEIGHTCHANGE @  [employer]   09/16/18 0956  Weight: 86 lb 3.2 oz (39.1 kg)     Physical Exam  General Appearance:    Alert, cooperative, no distress, appears stated age - yes , Deconditioned looking - no , OBESE  - no, Sitting on Wheelchair -  no  Head:    Normocephalic, without obvious abnormality, atraumatic  Eyes:    PERRL, conjunctiva/corneas clear,  Ears:    Normal TM's and external ear canals, both ears  Nose:   Nares normal, septum midline, mucosa normal, no drainage    or sinus tenderness. OXYGEN ON  - no . Patient is @ ra   Throat:   Lips, mucosa, and tongue normal; teeth and gums normal. Cyanosis on lips - no. ORA THRUSH +  on Palate  Neck:   Supple, symmetrical, trachea midline, no adenopathy;    thyroid:  no enlargement/tenderness/nodules; no  carotid   bruit or JVD  Back:     Symmetric, no curvature, ROM normal, no CVA tenderness  Lungs:     Distress - no , Wheeze no, Barrell Chest - no, Purse lip breathing - no, Crackles - no   Chest Wall:    No tenderness or deformity.    Heart:    Regular rate and rhythm, S1 and S2 normal, no rub   or gallop, Murmur - no  Breast Exam:    NOT DONE  Abdomen:     Soft, non-tender, bowel sounds active all four quadrants,    no masses, no organomegaly. Visceral obesity - no  Genitalia:   NOT DONE  Rectal:   NOT DONE  Extremities:   Extremities - normal, Has Cane - no, Clubbing - no, Edema - no  Pulses:   2+ and symmetric all extremities  Skin:   Stigmata of Connective Tissue Disease - no  Lymph nodes:   Cervical, supraclavicular, and axillary nodes normal  Psychiatric:  Neurologic:   Pleasant - yes, Anxious - no, Flat affect - no  CAm-ICU - neg, Alert and Oriented x 3 - yes, Moves all 4s - yes, Speech - normal, Cognition - intact           Assessment:       ICD-10-CM   1. COPD, severe ([place]) J44.9   2. Oral thrush B37.0   3. Collapse of right lung J98.11   4. Weight loss, unintentional R63.4   5. 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 IF nystatin is in back order: alternatives are  A) clotrimazole troche (throt lozenge) 10mg  dissolved and swish and swallow 5 times a day for 14 days. Or B  Collapse of right lung in CT march 2017 - do CT chest without contrast  Weight loss, unintentional - check TSH, FT4 and HIV  Followup  - return to see app next few weeks but after completing above      SIGNATURE    Dr. Brand Males, M.D., F.C.C.P,   Pulmonary and Critical Care Medicine Staff Physician, [place] Director - Interstitial Lung Disease  Program  [facility] [place], [place], [address]  Pager: [phone], If no answer or between  15:00h - 7:00h: call [phone] Telephone: [phone]  10:30 AM 09/16/2018

## 2018-09-17 LAB — HIV ANTIBODY (ROUTINE TESTING W REFLEX): HIV 1&2 Ab, 4th Generation: NONREACTIVE

## 2018-09-18 ENCOUNTER — Telehealth: Payer: Self-pay | Admitting: Internal Medicine

## 2018-09-18 ENCOUNTER — Ambulatory Visit: Payer: Medicare Other

## 2018-09-18 NOTE — Telephone Encounter (Signed)
Called and spoke to patient, made aware of results. Voiced understanding. Nothing further is needed at this time.

## 2018-09-22 ENCOUNTER — Telehealth: Payer: Self-pay | Admitting: Internal Medicine

## 2018-09-22 NOTE — Progress Notes (Signed)
LMOM to return call.

## 2018-09-22 NOTE — Progress Notes (Signed)
LMOM to call back

## 2018-09-22 NOTE — Telephone Encounter (Signed)
Notes recorded by Shon Hale, CMA on 09/21/2018 at 2:07 PM EST lmtcb x2 for pt ------  Notes recorded by Riley Kill, CMA on 09/17/2018 at 3:54 PM EDT Called patient unable to reach left message to give [place] a call back. ------  Notes recorded by Brand Males, MD on 09/17/2018 at 3:13 PM EDT  HIV negative  All other labs were normal as well.   LMTCB

## 2018-09-24 NOTE — Telephone Encounter (Signed)
Spoke with pt, she states she received her lab results already and nothing further is needed.

## 2018-10-02 ENCOUNTER — Other Ambulatory Visit: Payer: Medicare Other

## 2018-10-09 ENCOUNTER — Ambulatory Visit (INDEPENDENT_AMBULATORY_CARE_PROVIDER_SITE_OTHER)
Admission: RE | Admit: 2018-10-09 | Discharge: 2018-10-09 | Disposition: A | Discharge status: Home / Self Care | Payer: Medicare Other | Source: Ambulatory Visit | Attending: Internal Medicine | Admitting: Internal Medicine

## 2018-10-09 DIAGNOSIS — J449 Chronic obstructive pulmonary disease, unspecified: Secondary | ICD-10-CM | POA: Diagnosis not present

## 2018-10-09 DIAGNOSIS — J9811 Atelectasis: Secondary | ICD-10-CM | POA: Diagnosis not present

## 2018-10-09 DIAGNOSIS — R918 Other nonspecific abnormal finding of lung field: Secondary | ICD-10-CM | POA: Diagnosis not present

## 2018-10-26 ENCOUNTER — Telehealth: Payer: Self-pay | Admitting: Internal Medicine

## 2018-10-26 NOTE — Telephone Encounter (Signed)
Attempted to call pt but unable to reach her. Left message for pt to return call. 

## 2018-10-27 NOTE — Telephone Encounter (Signed)
Pt is returning call. Cb is [phone].

## 2018-10-27 NOTE — Telephone Encounter (Signed)
Called and spoke with pt who had concerns about the upcoming PFT. Pt states she has problems with anxiety and is now currently taking zoloft to help with her anxiety. Pt stated to me she had concerns about being closed in to the booth of the PFT.  I stated to pt that there will be someone else in the room with her while the PFT is being performed and stated to her at any point during the test if she gets to where she cannot continue, whomever is performing the PFT could stop and they would notify MR about what happened during the PFT.  Pt expressed understanding. Nothing further needed.

## 2018-10-27 NOTE — Telephone Encounter (Signed)
Called patient, unable to reach left message to give us a call back. 

## 2018-10-30 ENCOUNTER — Ambulatory Visit: Payer: Medicare Other | Admitting: Internal Medicine

## 2018-11-02 ENCOUNTER — Ambulatory Visit: Payer: Medicare Other

## 2018-11-18 ENCOUNTER — Encounter (HOSPITAL_COMMUNITY): Payer: Self-pay | Admitting: Emergency Medicine

## 2018-11-18 ENCOUNTER — Other Ambulatory Visit: Payer: Self-pay

## 2018-11-18 ENCOUNTER — Ambulatory Visit (HOSPITAL_COMMUNITY)
Admission: EM | Admit: 2018-11-18 | Discharge: 2018-11-18 | Disposition: A | Discharge status: Home / Self Care | Payer: Medicare Other | Attending: Family Medicine | Admitting: Family Medicine

## 2018-11-18 DIAGNOSIS — J00 Acute nasopharyngitis [common cold]: Secondary | ICD-10-CM | POA: Diagnosis not present

## 2018-11-18 MED ORDER — FLUTICASONE PROPIONATE 50 MCG/ACT NA SUSP: 2.0000 | Freq: Every day | NASAL | 0 refills | Status: DC

## 2018-11-18 MED ORDER — CETIRIZINE HCL 10 MG PO CHEW: 10.0000 mg | CHEWABLE_TABLET | Freq: Every day | ORAL | 0 refills | Status: DC

## 2018-11-18 NOTE — ED Triage Notes (Signed)
Onset of symptoms 2 days ago.  Complains of cough, runny nose, minimal phlegm.

## 2018-11-18 NOTE — ED Provider Notes (Signed)
[facility]   [id number] 11/18/18 Arrival Time: 1696   CC: URI symptoms   SUBJECTIVE: History from: patient.  Cynthia Martinez is a 58 y.o. female who presents with abrupt onset of runny nose and mild nasal congestion x 4 days.  Denies sick exposure or precipitating event.  Has tried OTC ibuprofen and netti pot with minimal relief.  Symptoms are made worse at night.  Reports previous symptoms in the past.   Denies fever, chills, fatigue, sinus pain, sore throat, SOB, wheezing, chest pain, nausea, changes in bowel or bladder habits.    Received flu shot this year: no.  ROS: As per HPI.  Past Medical History:  Diagnosis Date  . Anemia   . Anxiety   . COPD (chronic obstructive pulmonary disease) ([place])   . Depression   . DUB (dysfunctional uterine bleeding)   . 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 Hypertension   . Pneumonia due to Haemophilus influenzae ([place]) 09/23/2015  . Shortness of breath dyspnea    Past Surgical History:  Procedure Laterality Date  . POLYPECTOMY     Allergies  Allergen Reactions  . Hydrochlorothiazide Hives  . Penicillins Hives    Tolerated ceftriaxone  Has patient had a PCN reaction causing immediate rash, facial/tongue/throat swelling, SOB or lightheadedness with hypotension:YES Has patient had a PCN reaction causing severe rash involving mucus membranes or skin necrosis: NO Has patient had a PCN reaction that required hospitalization NO Has patient had a PCN reaction occurring within the last 10 years: NO If all of the above answers are "NO", then may proceed with Cephalosporin use.   . Sulfa Antibiotics Hives   No current facility-administered medications on file prior to encounter.    Current Outpatient Medications on File Prior to Encounter  Medication Sig Dispense Refill  . albuterol (VENTOLIN HFA) 108 (90 Base) MCG/ACT inhaler Inhale 2 puffs into the lungs every 4 (four) hours as needed for wheezing. 18 g 6  . amLODipine  (NORVASC) 10 MG tablet Take 1 tablet (10 mg total) by mouth daily. 30 tablet 2  . umeclidinium bromide (INCRUSE ELLIPTA) 62.5 MCG/INH AEPB Inhale 1 puff into the lungs daily. 30 each 11  . budesonide (PULMICORT) 0.5 MG/2ML nebulizer solution 0.5 mg.    . metoprolol tartrate (LOPRESSOR) 25 MG tablet Take 1 tablet (25 mg total) 2 (two) times daily by mouth. 60 tablet 11  . mometasone-formoterol (DULERA) 200-5 MCG/ACT AERO Inhale 2 puffs into the lungs 2 (two) times daily. [facility] 1 Inhaler 2  . nystatin (MYCOSTATIN) 100000 UNIT/ML suspension Take 5 mLs (500,000 Units total) by mouth 4 (four) times daily. 60 mL 0  . vitamin B-12 (CYANOCOBALAMIN) 1000 MCG tablet Take 1 tablet (1,000 mcg total) by mouth daily. 90 tablet 3   Social History   Socioeconomic History  . Marital status: Single    Spouse name: Not on file  . Number of children: Not on file  . Years of education: Not on file  . Highest education level: Not on file  Occupational History  . Not on file  Social Needs  . Financial resource strain: Not on file  . Food insecurity:    Worry: Not on file    Inability: Not on file  . Transportation needs:    Medical: Not on file    Non-medical: Not on file  Tobacco Use  . Smoking status: Former Smoker    Packs/day: 0.50    Years: 9.00  Pack years: 4.50    Types: Cigarettes    Last attempt to quit: 07/19/2012    Years since quitting: 6.3  . Smokeless tobacco: Never Used  Substance and Sexual Activity  . Alcohol use: No    Alcohol/week: 0.0 standard drinks  . Drug use: No  . Sexual activity: Not Currently    Partners: Male    Birth control/protection: Condom  Lifestyle  . Physical activity:    Days per week: Not on file    Minutes per session: Not on file  . Stress: Not on file  Relationships  . Social connections:    Talks on phone: Not on file    Gets together: Not on file    Attends religious service: Not on file    Active member of club or organization: Not on  file    Attends meetings of clubs or organizations: Not on file    Relationship status: Not on file  . Intimate partner violence:    Fear of current or ex partner: Not on file    Emotionally abused: Not on file    Physically abused: Not on file    Forced sexual activity: Not on file  Other Topics Concern  . Not on file  Social History Narrative   Lives with fiancee in [place]   2 daughter in 56's and 79'3.   Worked as Secretary/administrator in past.         Family History  Problem Relation Age of Onset  . Diabetes Other     OBJECTIVE:  Vitals:   11/18/18 0853  BP: 139/74  Pulse: (!) 109  Resp: 18  Temp: 98.1 F (36.7 C)  TempSrc: Oral  SpO2: 96%     General appearance: alert; well-appearing, mildly fatigue, but nontoxic; speaking in full sentences and tolerating own secretions HEENT: NCAT; Ears: EACs clear, TMs pearly gray; Eyes: PERRL.  EOM grossly intact. Nose: nares patent with clear rhinorrhea, Throat: oropharynx clear, tonsils non erythematous or enlarged, uvula midline  Neck: supple without LAD Lungs: unlabored respirations, symmetrical air entry; cough: absent; no respiratory distress; CTAB Heart: Mild tachycardia, RR.  Radial pulses 2+ symmetrical bilaterally Skin: warm and dry Psychological: alert and cooperative; normal mood and affect  ASSESSMENT & PLAN:  1. Acute nasopharyngitis     Meds ordered this encounter  Medications  . cetirizine (ZYRTEC) 10 MG chewable tablet    Sig: Chew 1 tablet (10 mg total) by mouth daily.    Dispense:  20 tablet    Refill:  0    Order Specific Question:   Supervising Provider    Answer:   Raylene Everts [[id number]]  . fluticasone (FLONASE) 50 MCG/ACT nasal spray    Sig: Place 2 sprays into both nostrils daily.    Dispense:  16 g    Refill:  0    Order Specific Question:   Supervising Provider    Answer:   Raylene Everts [[id number]]    Get plenty of rest and push fluids Zyrte prescribed for nasal congestion, runny nose,  and/or sore throat Flonase prescribed for nasal congestion and runny nose Use medications daily for symptom relief Use OTC medications like ibuprofen or tylenol as needed fever or pain Continue with netti pot  Follow up with PCP if symptoms persist Return or go to ER if you have any new or worsening symptoms fever, chills, nausea, vomiting, chest pain, cough, shortness of breath, wheezing, abdominal pain, changes in bowel or bladder habits, etc...  Reviewed  expectations re: course of current medical issues. Questions answered. Outlined signs and symptoms indicating need for more acute intervention. Patient verbalized understanding. After Visit Summary given.         Lestine Box, PA-C 11/18/18 1005

## 2018-11-18 NOTE — Discharge Instructions (Signed)
Get plenty of rest and push fluids Zyrte prescribed for nasal congestion, runny nose, and/or sore throat Flonase prescribed for nasal congestion and runny nose Use medications daily for symptom relief Use OTC medications like ibuprofen or tylenol as needed fever or pain Continue with netti pot  Follow up with PCP if symptoms persist Return or go to ER if you have any new or worsening symptoms fever, chills, nausea, vomiting, chest pain, cough, shortness of breath, wheezing, abdominal pain, changes in bowel or bladder habits, etc..Marland Kitchen

## 2018-11-25 ENCOUNTER — Telehealth (HOSPITAL_COMMUNITY): Payer: Self-pay | Admitting: Emergency Medicine

## 2018-11-25 NOTE — Telephone Encounter (Signed)
Pt calling with concerns that the current medications she is on is not really helping to clear up her lungs.  Pt is wanting Prednisone for her symptoms.  Pt states she cannot get in to see her new PCP until 12/15/18.  Pt states she is using inhalers, nasal spray and zytrec as prescribed.  It was suggested she also try Mucinex for further relief of a productive cough.  If she is still not feeling better I recommended she return for reevaluation of her symptoms.  Pt states understanding.

## 2018-11-28 ENCOUNTER — Other Ambulatory Visit: Payer: Self-pay

## 2018-11-28 ENCOUNTER — Ambulatory Visit (HOSPITAL_COMMUNITY)
Admission: EM | Admit: 2018-11-28 | Discharge: 2018-11-28 | Disposition: A | Discharge status: Home / Self Care | Payer: Medicare Other | Attending: Family Medicine | Admitting: Family Medicine

## 2018-11-28 ENCOUNTER — Encounter (HOSPITAL_COMMUNITY): Payer: Self-pay | Admitting: Emergency Medicine

## 2018-11-28 DIAGNOSIS — J22 Unspecified acute lower respiratory infection: Secondary | ICD-10-CM | POA: Diagnosis not present

## 2018-11-28 MED ORDER — DOXYCYCLINE HYCLATE 100 MG PO CAPS: 100.0000 mg | ORAL_CAPSULE | Freq: Two times a day (BID) | ORAL | 0 refills | Status: DC

## 2018-11-28 MED ORDER — PREDNISONE 20 MG PO TABS: 20.0000 mg | ORAL_TABLET | Freq: Two times a day (BID) | ORAL | 0 refills | Status: DC

## 2018-11-28 NOTE — ED Triage Notes (Signed)
The patient presented to the [facility] with a complaint of a productive cough and congestion x 1 week.

## 2018-11-28 NOTE — Discharge Instructions (Signed)
Take the prednisone 2 x a day Take the doxycycline 2 x a day Take both with food Push fluids Continue inhaled treatments See pulmonary medicine on the 13th as scheduled

## 2018-11-28 NOTE — ED Provider Notes (Signed)
[employer]    CSN: [id number] Arrival date & time: 11/28/18  1000     History   Chief Complaint Chief Complaint  Patient presents with  . Cough    HPI Cynthia Martinez is a 58 y.o. female.   HPI  Patient was seen here on January 1 and was diagnosed with a viral upper respiratory infection.  She has been compliant with her medications and her inhalers.  In spite of this she is continuing to cough, coughing up foul sputum that she states smells bad and taste bad.  She is feeling very weak and tired.  She states that because of her COPD she often needs antibiotics/prednisone to recover from respiratory illness.  She does have an appointment with her pulmonologist on 11/30/2018 but does not feel she can wait that long.  Her appetite is decreased.  She is trying to drink enough fluids.  She does have a nebulizer at home that she is using.  Past Medical History:  Diagnosis Date  . Anemia   . Anxiety   . COPD (chronic obstructive pulmonary disease) ([place])   . Depression   . DUB (dysfunctional uterine bleeding)   . 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 Hypertension   . Pneumonia due to Haemophilus influenzae ([place]) 09/23/2015  . Shortness of breath dyspnea     Patient Active Problem List   Diagnosis Date Noted  . Rash and nonspecific skin eruption 09/22/2017  . Dysphagia   . Depression 08/07/2015  . Cholelithiasis 08/03/2015  . Allergic rhinitis 09/17/2014  . Poor dentition 08/19/2014  . Health care maintenance 08/19/2014  . COPD, severe ([place]) 10/18/2012  . Chronic hepatitis C virus infection ([place]) 04/30/2012  . Underweight 03/19/2012  . Pernicious anemia 12/20/2011  . Hypertension 12/20/2011    Past Surgical History:  Procedure Laterality Date  . POLYPECTOMY      OB History   No obstetric history on file.      Home Medications    Prior to Admission medications   Medication Sig Start Date End Date Taking? Authorizing Provider  albuterol  (VENTOLIN HFA) 108 (90 Base) MCG/ACT inhaler Inhale 2 puffs into the lungs every 4 (four) hours as needed for wheezing. 06/12/17 09/02/19 Yes Alphonzo Grieve, MD  amLODipine (NORVASC) 10 MG tablet Take 1 tablet (10 mg total) by mouth daily. 04/29/18  Yes Alphonzo Grieve, MD  budesonide (PULMICORT) 0.5 MG/2ML nebulizer solution 0.5 mg. 01/13/17  Yes [provider]  cetirizine (ZYRTEC) 10 MG chewable tablet Chew 1 tablet (10 mg total) by mouth daily. 11/18/18  Yes Wurst, [place], PA-C  fluticasone (FLONASE) 50 MCG/ACT nasal spray Place 2 sprays into both nostrils daily. 11/18/18  Yes Wurst, [place], PA-C  umeclidinium bromide (INCRUSE ELLIPTA) 62.5 MCG/INH AEPB Inhale 1 puff into the lungs daily. 08/21/18  Yes Alphonzo Grieve, MD  vitamin B-12 (CYANOCOBALAMIN) 1000 MCG tablet Take 1 tablet (1,000 mcg total) by mouth daily. 08/07/18  Yes Alphonzo Grieve, MD  doxycycline (VIBRAMYCIN) 100 MG capsule Take 1 capsule (100 mg total) by mouth 2 (two) times daily. 11/28/18   Raylene Everts, MD  mometasone-formoterol [facility]) 200-5 MCG/ACT AERO Inhale 2 puffs into the lungs 2 (two) times daily. [facility] 04/29/18   Alphonzo Grieve, MD  predniSONE (DELTASONE) 20 MG tablet Take 1 tablet (20 mg total) by mouth 2 (two) times daily with a meal. 11/28/18   Raylene Everts, MD    Family History Family History  Problem Relation Age of Onset  . Diabetes Other     Social History Social History   Tobacco Use  . Smoking status: Former Smoker    Packs/day: 0.50    Years: 9.00    Pack years: 4.50    Types: Cigarettes    Last attempt to quit: 07/19/2012    Years since quitting: 6.3  . Smokeless tobacco: Never Used  Substance Use Topics  . Alcohol use: No    Alcohol/week: 0.0 standard drinks  . Drug use: No     Allergies   Hydrochlorothiazide; Penicillins; and Sulfa antibiotics   Review of Systems Review of Systems  Constitutional: Positive for fatigue. Negative for chills and fever.  HENT:  Positive for dental problem. Negative for ear pain and sore throat.   Eyes: Negative for pain and visual disturbance.  Respiratory: Positive for cough, chest tightness, shortness of breath and wheezing.   Cardiovascular: Negative for chest pain and palpitations.  Gastrointestinal: Negative for abdominal pain and vomiting.  Genitourinary: Negative for dysuria and hematuria.  Musculoskeletal: Negative for arthralgias and back pain.  Skin: Negative for color change and rash.  Neurological: Negative for seizures and syncope.  All other systems reviewed and are negative.    Physical Exam Triage Vital Signs ED Triage Vitals  Enc Vitals Group     BP 11/28/18 1015 (!) 148/91     Pulse Rate 11/28/18 1015 (!) 117     Resp 11/28/18 1015 16     Temp 11/28/18 1015 98.2 F (36.8 C)     Temp Source 11/28/18 1015 Oral     SpO2 11/28/18 1015 95 %     Weight --      Height --      Head Circumference --      Peak Flow --      Pain Score 11/28/18 1016 0     Pain Loc --      Pain Edu? --      Excl. in [place]? --    No data found.  Updated Vital Signs BP (!) 148/91 (BP Location: Right Arm)   Pulse (!) 117   Temp 98.2 F (36.8 C) (Oral)   Resp 16   SpO2 95%   Visual Acuity Right Eye Distance:   Left Eye Distance:   Bilateral Distance:    Right Eye Near:   Left Eye Near:    Bilateral Near:     Physical Exam Constitutional:      General: She is not in acute distress.    Appearance: She is well-developed. She is ill-appearing.     Comments: Thin and frail in appearance.  Appears ill  HENT:     Head: Normocephalic and atraumatic.     Right Ear: Tympanic membrane and ear canal normal.     Left Ear: Tympanic membrane and ear canal normal.     Nose: Nose normal. No congestion.     Mouth/Throat:     Mouth: Mucous membranes are moist.     Pharynx: Posterior oropharyngeal erythema present. No oropharyngeal exudate.     Comments: Patient has upper denture plates.  Her soft palate behind  the plates is quite erythematous Eyes:     Conjunctiva/sclera: Conjunctivae normal.     Pupils: Pupils are equal, round, and reactive to light.  Neck:     Musculoskeletal: Normal range of motion.  Cardiovascular:     Rate and Rhythm: Tachycardia present.     Heart sounds: Normal heart sounds.  Pulmonary:  Effort: Pulmonary effort is normal. No respiratory distress.     Breath sounds: Rhonchi present.     Comments: Anterior rhonchi.  No rales, no wheeze Abdominal:     General: There is no distension.     Palpations: Abdomen is soft.  Musculoskeletal: Normal range of motion.  Skin:    General: Skin is warm and dry.  Neurological:     Mental Status: She is alert.  Psychiatric:        Thought Content: Thought content normal.        Judgment: Judgment normal.      UC Treatments / Results  Labs (all labs ordered are listed, but only abnormal results are displayed) Labs Reviewed - No data to display  EKG None  Radiology No results found.  Procedures Procedures (including critical care time)  Medications Ordered in UC Medications - No data to display  Initial Impression / Assessment and Plan / UC Course  I have reviewed the triage vital signs and the nursing notes.  Pertinent labs & imaging results that were available during my care of the patient were reviewed by me and considered in my medical decision making (see chart for details).    LRTI-I do not hear any pneumonia but patient has had a respiratory infection for approximately 2 weeks without improvement.  Believe she has COPD exacerbation with respiratory infection. Final Clinical Impressions(s) / UC Diagnoses   Final diagnoses:  LRTI (lower respiratory tract infection)     Discharge Instructions     Take the prednisone 2 x a day Take the doxycycline 2 x a day Take both with food Push fluids Continue inhaled treatments See pulmonary medicine on the 13th as scheduled   ED Prescriptions     Medication Sig Dispense Auth. Provider   predniSONE (DELTASONE) 20 MG tablet Take 1 tablet (20 mg total) by mouth 2 (two) times daily with a meal. 10 tablet Raylene Everts, MD   doxycycline (VIBRAMYCIN) 100 MG capsule Take 1 capsule (100 mg total) by mouth 2 (two) times daily. 20 capsule Raylene Everts, MD     Controlled Substance Prescriptions [place] Controlled Substance Registry consulted? Not Applicable   Raylene Everts, MD 11/28/18 1056

## 2018-11-30 ENCOUNTER — Ambulatory Visit: Payer: Medicare Other | Admitting: Internal Medicine

## 2018-11-30 MED FILL — NYSTATIN 100,000 UNITS/ML S: 100000 | 3 days supply | Qty: 60 | Fill #0

## 2018-12-04 ENCOUNTER — Ambulatory Visit: Payer: Medicare Other

## 2018-12-17 ENCOUNTER — Other Ambulatory Visit: Payer: Self-pay | Admitting: Internal Medicine

## 2018-12-17 DIAGNOSIS — I10 Essential (primary) hypertension: Secondary | ICD-10-CM

## 2018-12-17 MED FILL — AMLODIPINE BESYLATE 10 MG T: 10 | 30 days supply | Qty: 30 | Fill #0

## 2018-12-17 NOTE — Telephone Encounter (Signed)
She has an [facility] appt scheduled for tomorrow.

## 2018-12-18 ENCOUNTER — Ambulatory Visit: Payer: Medicare Other

## 2018-12-28 ENCOUNTER — Ambulatory Visit
Admission: RE | Admit: 2018-12-28 | Discharge: 2018-12-28 | Disposition: A | Discharge status: Home / Self Care | Payer: Medicare Other | Source: Ambulatory Visit | Attending: Internal Medicine | Admitting: Internal Medicine

## 2018-12-28 ENCOUNTER — Ambulatory Visit: Payer: Medicare Other

## 2018-12-28 DIAGNOSIS — Z1239 Encounter for other screening for malignant neoplasm of breast: Secondary | ICD-10-CM

## 2019-01-21 ENCOUNTER — Telehealth: Payer: Self-pay | Admitting: Internal Medicine

## 2019-01-21 DIAGNOSIS — R911 Solitary pulmonary nodule: Secondary | ICD-10-CM

## 2019-01-21 NOTE — Telephone Encounter (Signed)
Advised pt of results. Pt understood and nothing further is needed.   Refused bronch at this time. Order was placed for CT in 09/2019.  Notes recorded by Brand Males, MD on 10/15/2018 at 5:45 PM EST Will discuss dec 2019 visit. RML collapse persists- might need bronch. New very small nodules - will need repeat ct in 1 year

## 2019-01-21 NOTE — Telephone Encounter (Signed)
Pt is calling back requesting results CB# [phone]//[phone]

## 2019-01-26 NOTE — Progress Notes (Deleted)
   CC: ***  HPI:  Ms.Amorah D Sedlack is a 58 y.o. with a PMH of ***  Please see problem based Assessment and Plan for status of patients chronic conditions.  Past Medical History:  Diagnosis Date  . Anemia   . Anxiety   . COPD (chronic obstructive pulmonary disease) ([place])   . Depression   . DUB (dysfunctional uterine bleeding)   . 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 Hypertension   . Pneumonia due to Haemophilus influenzae ([place]) 09/23/2015  . Shortness of breath dyspnea     Review of Systems:   ***  Physical Exam:  There were no vitals filed for this visit. GENERAL- alert, co-operative, appears as stated age, not in any distress. HEENT- Atraumatic, normocephalic, PERRL, EOMI, oral mucosa appears moist. CARDIAC- RRR, no murmurs, rubs or gallops. RESP- Moving equal volumes of air, and clear to auscultation bilaterally, no wheezes or crackles. ABDOMEN- Soft, nontender, bowel sounds present. NEURO- CN 2-12 grossly intact. EXTREMITIES- pulse 2+, symmetric, no pedal edema. SKIN- Warm, dry, no rash or lesion. PSYCH- Normal mood and affect, appropriate thought content and speech.  Assessment & Plan:   See Encounters Tab for problem based charting.   Patient {GC/GE:3044014::"discussed with","seen with"} Dr. {NAMES:[id number]::"Butcher","Granfortuna","E. Hoffman","Klima","Mullen","Narendra","Raines","Vincent"}   Alphonzo Grieve, MD Internal Medicine PGY-3

## 2019-01-27 ENCOUNTER — Encounter: Payer: Self-pay | Admitting: Family Medicine

## 2019-01-27 ENCOUNTER — Encounter: Payer: Medicare Other | Admitting: Internal Medicine

## 2019-02-23 ENCOUNTER — Other Ambulatory Visit: Payer: Self-pay | Admitting: Internal Medicine

## 2019-02-23 DIAGNOSIS — I10 Essential (primary) hypertension: Secondary | ICD-10-CM

## 2019-03-31 ENCOUNTER — Telehealth: Payer: Self-pay | Admitting: Internal Medicine

## 2019-03-31 DIAGNOSIS — J449 Chronic obstructive pulmonary disease, unspecified: Secondary | ICD-10-CM

## 2019-03-31 MED ORDER — BUDESONIDE 0.5 MG/2ML IN SUSP: 0.5000 mg | Freq: Two times a day (BID) | RESPIRATORY_TRACT | 5 refills | Status: DC

## 2019-03-31 NOTE — Telephone Encounter (Signed)
Ok, please update medication list. Send RX for pulmicort and travel neb

## 2019-03-31 NOTE — Telephone Encounter (Signed)
Pt is not using the dulera inhaler but is using the incruse. Pt states she has an albuterol inhaler that she uses as needed.  Pt stated she was told to use pulmicort neb sol bid.

## 2019-03-31 NOTE — Telephone Encounter (Signed)
Called and spoke with pt who is requesting a prescription for a hand held nebulizer.  Pt also is needing a refill of Pulmicort neb sol which was previously prescribed by pt's PCP.  Beth, please advise if you are okay with [place] prescribing this for pt.  Thanks!

## 2019-03-31 NOTE — Telephone Encounter (Signed)
Is she still on Dulera and incruse? How often is pulmicort prescribed

## 2019-03-31 NOTE — Telephone Encounter (Signed)
Removed dulera off of pt's med list. Called pt and stated to her that we were going to send Rx for nebulizer as well as pulmicort neb sol in for her and pt verbalized understanding. Verified pt's preferred pharmacy and sent neb sol in for pt. Nothing further needed.

## 2019-04-13 ENCOUNTER — Telehealth: Payer: Self-pay | Admitting: Internal Medicine

## 2019-04-13 NOTE — Telephone Encounter (Signed)
I got a message back from [facility] she stated they are not in network to call Daisy I have faxed the order to [facility] and called the patient to let her know

## 2019-04-13 NOTE — Telephone Encounter (Signed)
I have sent a message to [facility] medical waiting on an update.

## 2019-04-13 NOTE — Telephone Encounter (Signed)
Order was placed 03/31/2019 for pt to receive a hand held nebulizer and the order was cosigned by Derl Barrow. This was sent to [facility] 5/13.  PCCs, please advise if you can help [place] out with this as pt is calling checking on status of her receiving the nebulizer. Thanks!

## 2019-04-15 ENCOUNTER — Telehealth: Payer: Self-pay | Admitting: Internal Medicine

## 2019-04-15 MED ORDER — PREDNISONE 10 MG PO TABS: 20.0000 mg | ORAL_TABLET | Freq: Every day | ORAL | 0 refills | Status: DC

## 2019-04-15 MED ORDER — HYDROXYZINE HCL 50 MG PO TABS: ORAL_TABLET | ORAL | 0 refills | Status: DC

## 2019-04-15 NOTE — Telephone Encounter (Signed)
Spoke with pt and relayed BM recommendations.  Script sent to preferred pharmacy.  Nothing further is needed.

## 2019-04-15 NOTE — Telephone Encounter (Signed)
Patient is returning phone call.  Patient phone number is [phone].

## 2019-04-15 NOTE — Telephone Encounter (Signed)
Sorry to hear the patient is feeling this way. Very sorry to hear of her loss.   I recommend she follows up with PCP about anxiety and grief.   Can offer:   Prednisone 10mg  tablet  >>>Take 2 tablets (20 mg total) daily for the next 5 days >>> Take with food in the morning  Place order please   Can also offer:   Hydroxyzine 50 mg tablets >>> Can take 1/2 tablet, or 1 tablet, or 2 tablets every 8 hours as needed for anxiety/initiating sleep >>> This medication is sedating >>> Start with 25 mg (half tablet) initially to see how you tolerate  30 tablets no refills. PCP can refill if necessary.   Place order please.   Wyn Quaker FNP

## 2019-04-15 NOTE — Telephone Encounter (Signed)
Left message for patient to call back  

## 2019-04-15 NOTE — Telephone Encounter (Signed)
Primary Pulmonologist: MR Last office visit and with whom: 09/16/18 with MR What do we see them for (pulmonary problems): COPD Last OV assessment/plan: Instructions   COPD, severe ([place])  - do full PFt  - continue current inhalers  Oral thrush # Oral thrush - For Oral thrush: Take Suspension (swish and swallow): 500,000 units 4 times/day for 5 days; swish in the mouth and retain for as long as possible (several minutes) before swallowing. IF nystatin is in back order: alternatives are  A) clotrimazole troche (throt lozenge) 10mg  dissolved and swish and swallow 5 times a day for 14 days. 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  Pt wants recommendations   Reason for call: called and spoke with pt who states within the last couple days her SOB has been worse. Pt stated she was told that her mother tested positive for COVID and she stays in a nursing home. Pt states she has not been around her mom as visitors were stopped from being able to come. Pt stated the last time she saw her mom was the last week in April.  Pt stated pt's mother tested positive for COVID this past April 22, 2023 and she passed away this morning Apr 28, 2023 as pt stated she received the call about 6am with the bad news.  Pt believes the SOB that has come on is possibly anxiety related due to this. Pt denies any complaints of any wheezing just has SOB that has been triggered.  Pt does have a nebulizer that is going to be reserviced by DME tomorrow, 5/29 so she will be able to use it. Pt also has incruse that she uses as prescribed.  Pt wants to know what we recommend to help her out. Aaron Edelman, please advise. Thanks!

## 2019-04-21 ENCOUNTER — Telehealth: Payer: Self-pay | Admitting: Internal Medicine

## 2019-04-21 MED ORDER — DOXYCYCLINE HYCLATE 100 MG PO TABS: 100.0000 mg | ORAL_TABLET | Freq: Two times a day (BID) | ORAL | 0 refills | Status: DC

## 2019-04-21 NOTE — Telephone Encounter (Signed)
Pt is calling back [phone]

## 2019-04-21 NOTE — Telephone Encounter (Signed)
Primary Pulmonologist: MR Last office visit and with whom: 09/16/18, MR What do we see them for (pulmonary problems): COPD Last OV assessment/plan:  Instructions   COPD, severe ([place])  - do full PFt  - continue current inhalers  Oral thrush # Oral thrush - For Oral thrush: Take Suspension (swish and swallow): 500,000 units 4 times/day for 5 days; swish in the mouth and retain for as long as possible (several minutes) before swallowing. IF nystatin is in back order: alternatives are  A) clotrimazole troche (throt lozenge) 10mg  dissolved and swish and swallow 5 times a day for 14 days. Or B  Collapse of right lung in CT march 2017 - do CT chest without contrast  Weight loss, unintentional - check TSH, FT4 and HIV  Followup  - return to see app next few weeks but after completing above       Reason for call:  Called and spoke with Patient. Patient stated she started having a productive cough, with yellow mucus, yesterday.  Patient stated in the past MR would call her antibiotic in to pharmacy for her, whenever she had productive cough.  Patient denies any other symptoms at this time.  Patient has not taken any OTC medications at this time. Patient is using prescribed inhalers and nebs as prescribed. Patient request antibiotic to be sent to [facility].   Message routed to [facility], NP to advise on antibiotic

## 2019-04-21 NOTE — Telephone Encounter (Signed)
Will send in antibiotic. Have her take mucinex twice daily. If not better needs visit.

## 2019-04-21 NOTE — Telephone Encounter (Signed)
Called and spoke with Patient.  EW recommendations given.  Understanding stated.  Nothing further at this time.

## 2019-04-21 NOTE — Telephone Encounter (Signed)
Attempted to call pt but unable to reach. Left message for pt to return call. 

## 2019-05-24 ENCOUNTER — Telehealth: Payer: Self-pay | Admitting: Internal Medicine

## 2019-05-24 NOTE — Telephone Encounter (Signed)
Pt returning call a/b med refill for metoprolol.Cynthia Martinez

## 2019-05-24 NOTE — Telephone Encounter (Signed)
Called and spoke with pt letting her know that she needs to contact PCP to get refill of metoprolol and pt verbalized understanding. Nothing further needed.

## 2019-05-24 NOTE — Telephone Encounter (Signed)
Attempted to call pt but unable to reach. Left message for pt to return call.  If pt is wanting to be prescribed metoprolol for her blood pressure, pt will need to contact PCP.

## 2019-08-02 ENCOUNTER — Telehealth: Payer: Self-pay | Admitting: Internal Medicine

## 2019-08-02 NOTE — Telephone Encounter (Signed)
Spoke with pt, states that her daughter had started a weight gain syrup that she had ordered OTC through [employer].  Pt want's MR's recs if this is safe for her to take with her COPD.    Name: Apetamin syrup appetite stimulant  I advised pt that we haven't seen her in 11 months, and that this might be a better question for her PCP.  She expressed understanding but still wanted MR's recs from a respiratory standpoint.  MR please advise on your thoughts regarding this OTC appetite stimulant.  Thanks!

## 2019-08-02 NOTE — Telephone Encounter (Signed)
Called and spoke with pt regarding MR's response. Pt verbalized understanding and states she will seek further advice from her PCP regarding this appetite stimulant. Nothing further needed at this time.

## 2019-08-02 NOTE — Telephone Encounter (Signed)
Never heard of it. [place] people who sell books, groceries, gift cards, electronics alos selling medicines

## 2019-08-16 ENCOUNTER — Encounter (HOSPITAL_COMMUNITY): Payer: Self-pay

## 2019-08-16 ENCOUNTER — Other Ambulatory Visit: Payer: Self-pay

## 2019-08-16 ENCOUNTER — Ambulatory Visit (HOSPITAL_COMMUNITY)
Admission: EM | Admit: 2019-08-16 | Discharge: 2019-08-16 | Disposition: A | Discharge status: Home / Self Care | Payer: Medicare Other | Attending: Family Medicine | Admitting: Family Medicine

## 2019-08-16 DIAGNOSIS — D510 Vitamin B12 deficiency anemia due to intrinsic factor deficiency: Secondary | ICD-10-CM | POA: Insufficient documentation

## 2019-08-16 DIAGNOSIS — Z20828 Contact with and (suspected) exposure to other viral communicable diseases: Secondary | ICD-10-CM | POA: Diagnosis not present

## 2019-08-16 DIAGNOSIS — B182 Chronic viral hepatitis C: Secondary | ICD-10-CM | POA: Insufficient documentation

## 2019-08-16 DIAGNOSIS — J449 Chronic obstructive pulmonary disease, unspecified: Secondary | ICD-10-CM | POA: Insufficient documentation

## 2019-08-16 DIAGNOSIS — J0101 Acute recurrent maxillary sinusitis: Secondary | ICD-10-CM | POA: Insufficient documentation

## 2019-08-16 DIAGNOSIS — Z87891 Personal history of nicotine dependence: Secondary | ICD-10-CM | POA: Diagnosis not present

## 2019-08-16 DIAGNOSIS — R0981 Nasal congestion: Secondary | ICD-10-CM | POA: Diagnosis not present

## 2019-08-16 DIAGNOSIS — Z8619 Personal history of other infectious and parasitic diseases: Secondary | ICD-10-CM | POA: Diagnosis not present

## 2019-08-16 DIAGNOSIS — R05 Cough: Secondary | ICD-10-CM | POA: Diagnosis not present

## 2019-08-16 DIAGNOSIS — I10 Essential (primary) hypertension: Secondary | ICD-10-CM | POA: Diagnosis not present

## 2019-08-16 DIAGNOSIS — F419 Anxiety disorder, unspecified: Secondary | ICD-10-CM | POA: Diagnosis not present

## 2019-08-16 DIAGNOSIS — Z79899 Other long term (current) drug therapy: Secondary | ICD-10-CM | POA: Insufficient documentation

## 2019-08-16 MED ORDER — DOXYCYCLINE HYCLATE 100 MG PO CAPS: 100.0000 mg | ORAL_CAPSULE | Freq: Two times a day (BID) | ORAL | 0 refills | Status: DC

## 2019-08-16 MED ORDER — DOXYCYCLINE HYCLATE 100 MG PO CAPS: 100.0000 mg | ORAL_CAPSULE | Freq: Two times a day (BID) | ORAL | 0 refills | Status: AC

## 2019-08-16 MED ORDER — PREDNISONE 20 MG PO TABS: 20.0000 mg | ORAL_TABLET | Freq: Two times a day (BID) | ORAL | 0 refills | Status: DC

## 2019-08-16 NOTE — ED Provider Notes (Addendum)
[place]    CSN: [id number] Arrival date & time: 08/16/19  0802      History   Chief Complaint Chief Complaint  Patient presents with  . Cough  . Nasal Congestion    HPI Cynthia Martinez is a 58 y.o. female.   Patient is a 58 year old female past medical history of anemia, anxiety, COPD, depression, hep C, hypertension, pneumonia, shortness of breath. She presents today with productive cough and sinus congestion x 5 days. Yellow mucous.  She has had some ear pooping when she blows her nose.  Feels as if she has a sinus infection. Symptoms have been constant ans worsening.  She has been using Flonase and Zyrtec without much relief. No chest pain, SOB. No LE edema.   ROS per HPI      Past Medical History:  Diagnosis Date  . Anemia   . Anxiety   . COPD (chronic obstructive pulmonary disease) ([place])   . Depression   . DUB (dysfunctional uterine bleeding)   . 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 Hypertension   . Pneumonia due to Haemophilus influenzae ([place]) 09/23/2015  . Shortness of breath dyspnea     Patient Active Problem List   Diagnosis Date Noted  . Rash and nonspecific skin eruption 09/22/2017  . Dysphagia   . Depression 08/07/2015  . Cholelithiasis 08/03/2015  . Allergic rhinitis 09/17/2014  . Poor dentition 08/19/2014  . Health care maintenance 08/19/2014  . COPD, severe ([place]) 10/18/2012  . Chronic hepatitis C virus infection ([place]) 04/30/2012  . Underweight 03/19/2012  . Pernicious anemia 12/20/2011  . Hypertension 12/20/2011    Past Surgical History:  Procedure Laterality Date  . POLYPECTOMY      OB History   No obstetric history on file.      Home Medications    Prior to Admission medications   Medication Sig Start Date End Date Taking? Authorizing Provider  albuterol (VENTOLIN HFA) 108 (90 Base) MCG/ACT inhaler Inhale 2 puffs into the lungs every 4 (four) hours as needed for wheezing. 06/12/17 09/02/19  Alphonzo Grieve, MD  amLODipine (NORVASC) 10 MG tablet TAKE 1 TABLET (10 MG TOTAL) BY MOUTH DAILY. 12/17/18   Alphonzo Grieve, MD  budesonide (PULMICORT) 0.5 MG/2ML nebulizer solution Take 2 mLs (0.5 mg total) by nebulization 2 (two) times a day. 03/31/19   Martyn Ehrich, NP  cetirizine (ZYRTEC) 10 MG chewable tablet Chew 1 tablet (10 mg total) by mouth daily. 11/18/18   Wurst, [place], PA-C  doxycycline (VIBRAMYCIN) 100 MG capsule Take 1 capsule (100 mg total) by mouth 2 (two) times daily for 7 days. 08/16/19 08/23/19  Loura Halt A, NP  fluticasone (FLONASE) 50 MCG/ACT nasal spray Place 2 sprays into both nostrils daily. 11/18/18   Wurst, [place], PA-C  hydrOXYzine (ATARAX/VISTARIL) 50 MG tablet Take 1/2 to 1 to 2 tablets every 8 hours as needed for anxiety and initiate sleep.  Start with 1/2 tablet and advance as tolerated. 04/15/19   Lauraine Rinne, NP  predniSONE (DELTASONE) 20 MG tablet Take 1 tablet (20 mg total) by mouth 2 (two) times daily with a meal. 08/16/19   Maxene Byington A, NP  umeclidinium bromide (INCRUSE ELLIPTA) 62.5 MCG/INH AEPB Inhale 1 puff into the lungs daily. 08/21/18   Alphonzo Grieve, MD  vitamin B-12 (CYANOCOBALAMIN) 1000 MCG tablet Take 1 tablet (1,000 mcg total) by mouth daily. 08/07/18   Alphonzo Grieve, MD    Family History Family  History  Problem Relation Age of Onset  . Diabetes Other   . Healthy Mother   . Healthy Father     Social History Social History   Tobacco Use  . Smoking status: Former Smoker    Packs/day: 0.50    Years: 9.00    Pack years: 4.50    Types: Cigarettes    Quit date: 07/19/2012    Years since quitting: 7.0  . Smokeless tobacco: Never Used  Substance Use Topics  . Alcohol use: No    Alcohol/week: 0.0 standard drinks  . Drug use: No     Allergies   Hydrochlorothiazide, Penicillins, and Sulfa antibiotics   Review of Systems Review of Systems   Physical Exam Triage Vital Signs ED Triage Vitals  Enc Vitals Group     BP 08/16/19 0815  (!) 140/91     Pulse Rate 08/16/19 0815 (!) 102     Resp 08/16/19 0815 16     Temp 08/16/19 0815 98.5 F (36.9 C)     Temp Source 08/16/19 0815 Oral     SpO2 08/16/19 0815 99 %     Weight --      Height --      Head Circumference --      Peak Flow --      Pain Score 08/16/19 0813 0     Pain Loc --      Pain Edu? --      Excl. in [place]? --    No data found.  Updated Vital Signs BP (!) 140/91 (BP Location: Right Arm) Comment: has not taken bp meds yet  Pulse (!) 102   Temp 98.5 F (36.9 C) (Oral)   Resp 16   SpO2 99%   Visual Acuity Right Eye Distance:   Left Eye Distance:   Bilateral Distance:    Right Eye Near:   Left Eye Near:    Bilateral Near:     Physical Exam Vitals signs and nursing note reviewed.  Constitutional:      General: She is not in acute distress.    Appearance: Normal appearance. She is not ill-appearing, toxic-appearing or diaphoretic.  HENT:     Head: Normocephalic and atraumatic.     Right Ear: Tympanic membrane and ear canal normal.     Left Ear: Tympanic membrane and ear canal normal.     Nose: Congestion present.     Mouth/Throat:     Pharynx: Oropharynx is clear.  Eyes:     Conjunctiva/sclera: Conjunctivae normal.  Neck:     Musculoskeletal: Normal range of motion.  Cardiovascular:     Rate and Rhythm: Normal rate and regular rhythm.  Pulmonary:     Effort: Pulmonary effort is normal.     Breath sounds: Wheezing present.  Musculoskeletal: Normal range of motion.  Skin:    General: Skin is warm and dry.  Neurological:     Mental Status: She is alert.  Psychiatric:        Mood and Affect: Mood normal.      UC Treatments / Results  Labs (all labs ordered are listed, but only abnormal results are displayed) Labs Reviewed  NOVEL CORONAVIRUS, NAA (HOSP ORDER, SEND-OUT TO REF LAB; TAT 18-24 HRS)    EKG   Radiology No results found.  Procedures Procedures (including critical care time)  Medications Ordered in UC  Medications - No data to display  Initial Impression / Assessment and Plan / UC Course  I have reviewed the triage  vital signs and the nursing notes.  Pertinent labs & imaging results that were available during my care of the patient were reviewed by me and considered in my medical decision making (see chart for details).     Treating patient for recurrent sinus infection. Patient has been using Flonase and Zyrtec for the past 5 days getting worse. Doxycycline for antibiotic coverage and prednisone burst over the next 5 days Recommended continuing the Flonase and Zyrtec Follow up as needed for continued or worsening symptoms  Final Clinical Impressions(s) / UC Diagnoses   Final diagnoses:  Acute recurrent maxillary sinusitis     Discharge Instructions     Treating you for a sinus infection Take the antibiotics as prescribed.  Prednisone daily for 5 days. Continue taking your Flonase Follow up as needed for continued or worsening symptoms     ED Prescriptions    Medication Sig Dispense Auth. Provider   predniSONE (DELTASONE) 20 MG tablet Take 1 tablet (20 mg total) by mouth 2 (two) times daily with a meal. 10 tablet Makela Niehoff A, NP   doxycycline (VIBRAMYCIN) 100 MG capsule  (Status: Discontinued) Take 1 capsule (100 mg total) by mouth 2 (two) times daily. 20 capsule Leveda Kendrix A, NP   doxycycline (VIBRAMYCIN) 100 MG capsule Take 1 capsule (100 mg total) by mouth 2 (two) times daily for 7 days. 14 capsule Deshun Sedivy A, NP     PDMP not reviewed this encounter.   Orvan July, NP 08/16/19 0854    Orvan July, NP 08/16/19 [phone]

## 2019-08-16 NOTE — ED Triage Notes (Signed)
Patient presents to Urgent Care with complaints of productive cough and sinus congestion since a few days ago. Patient reports she can feel her ears pop when she blows her nose, thinks she has some kind of sinus infection.

## 2019-08-16 NOTE — Discharge Instructions (Signed)
Treating you for a sinus infection Take the antibiotics as prescribed.  Prednisone daily for 5 days. Continue taking your Flonase Follow up as needed for continued or worsening symptoms

## 2019-08-18 LAB — NOVEL CORONAVIRUS, NAA (HOSP ORDER, SEND-OUT TO REF LAB; TAT 18-24 HRS): SARS-CoV-2, NAA: NOT DETECTED

## 2019-09-26 DIAGNOSIS — J449 Chronic obstructive pulmonary disease, unspecified: Secondary | ICD-10-CM | POA: Diagnosis not present

## 2019-09-30 DIAGNOSIS — N390 Urinary tract infection, site not specified: Secondary | ICD-10-CM | POA: Diagnosis not present

## 2019-09-30 DIAGNOSIS — I10 Essential (primary) hypertension: Secondary | ICD-10-CM | POA: Diagnosis not present

## 2019-09-30 DIAGNOSIS — J441 Chronic obstructive pulmonary disease with (acute) exacerbation: Secondary | ICD-10-CM | POA: Diagnosis not present

## 2019-10-01 ENCOUNTER — Telehealth: Payer: Self-pay

## 2019-10-01 NOTE — Telephone Encounter (Signed)
Received TC from [facility]. Cynthia Martinez requesting results from last fecal occult blood test for her new PCP.  Pt DOB verified and she was informed on 01/26/2017/ FOB was negative.  FOB printed and mailed to patient per her request. SChaplin, RN,BSN

## 2019-10-11 ENCOUNTER — Ambulatory Visit (INDEPENDENT_AMBULATORY_CARE_PROVIDER_SITE_OTHER)
Admission: RE | Admit: 2019-10-11 | Discharge: 2019-10-11 | Disposition: A | Discharge status: Home / Self Care | Payer: Medicare Other | Source: Ambulatory Visit | Attending: Internal Medicine | Admitting: Internal Medicine

## 2019-10-11 ENCOUNTER — Other Ambulatory Visit: Payer: Self-pay

## 2019-10-11 DIAGNOSIS — R911 Solitary pulmonary nodule: Secondary | ICD-10-CM

## 2019-10-11 DIAGNOSIS — R918 Other nonspecific abnormal finding of lung field: Secondary | ICD-10-CM | POA: Diagnosis not present

## 2019-10-13 DIAGNOSIS — J449 Chronic obstructive pulmonary disease, unspecified: Secondary | ICD-10-CM | POA: Diagnosis not present

## 2019-10-26 DIAGNOSIS — J449 Chronic obstructive pulmonary disease, unspecified: Secondary | ICD-10-CM | POA: Diagnosis not present

## 2019-10-29 DIAGNOSIS — J339 Nasal polyp, unspecified: Secondary | ICD-10-CM | POA: Diagnosis not present

## 2019-10-29 DIAGNOSIS — J329 Chronic sinusitis, unspecified: Secondary | ICD-10-CM | POA: Diagnosis not present

## 2019-10-29 DIAGNOSIS — B370 Candidal stomatitis: Secondary | ICD-10-CM | POA: Diagnosis not present

## 2019-11-02 ENCOUNTER — Telehealth: Payer: Self-pay | Admitting: Internal Medicine

## 2019-11-02 NOTE — Telephone Encounter (Signed)
Last seen > 1 year ago. She had CT chest end nov 2020 that shows chronic RML collapse but als increased in nodularity. She needs bronchoscopy.   Plan  - refer to Dr Ernest Mallick for evaluation or Dr Mosie Lukes - patient can return to me or they can continue to care for patient depends on out come of their eval    SIGNATURE    Dr. Brand Males, M.D., F.C.C.P,  Pulmonary and Critical Care Medicine Staff Physician, [facility] Director - Interstitial Lung Disease  Program  Pulmonary [facility] [place], [place], [address]  Pager: [phone], If no answer or between  15:00h - 7:00h: call [phone] Telephone: [phone]  4:30 PM 11/02/2019

## 2019-11-03 NOTE — Telephone Encounter (Signed)
Called and spoke with pt letting her know the info from MR in regards to CT and that he wants her to see either Dr. Carlis Abbott or Dr. Valeta Harms for second opinion on the CT. Pt verbalized understanding. appt scheduled for pt with Dr. Carlis Abbott 12/29. Nothing further needed.

## 2019-11-15 ENCOUNTER — Other Ambulatory Visit: Payer: Self-pay | Admitting: Family Medicine

## 2019-11-15 DIAGNOSIS — Z1231 Encounter for screening mammogram for malignant neoplasm of breast: Secondary | ICD-10-CM

## 2019-11-16 ENCOUNTER — Other Ambulatory Visit: Payer: Self-pay

## 2019-11-16 ENCOUNTER — Ambulatory Visit (INDEPENDENT_AMBULATORY_CARE_PROVIDER_SITE_OTHER): Payer: Medicare Other | Admitting: Critical Care Medicine

## 2019-11-16 ENCOUNTER — Encounter: Payer: Self-pay | Admitting: Critical Care Medicine

## 2019-11-16 VITALS — BP 110/68 | HR 120 | Temp 98.1°F | Ht 63.0 in | Wt 89.2 lb

## 2019-11-16 DIAGNOSIS — R053 Chronic cough: Secondary | ICD-10-CM

## 2019-11-16 DIAGNOSIS — R9389 Abnormal findings on diagnostic imaging of other specified body structures: Secondary | ICD-10-CM

## 2019-11-16 DIAGNOSIS — R05 Cough: Secondary | ICD-10-CM

## 2019-11-16 DIAGNOSIS — J479 Bronchiectasis, uncomplicated: Secondary | ICD-10-CM | POA: Diagnosis not present

## 2019-11-16 NOTE — Patient Instructions (Addendum)
Thank you for visiting Dr. Carlis Abbott at [facility]. We recommend the following: Orders Placed This Encounter  Procedures  . IgG, IgA, IgM  . IgG 1, 2, 3, and 4  . IgE   Orders Placed This Encounter  Procedures  . IgG, IgA, IgM    Standing Status:   Future    Standing Expiration Date:   11/15/2020  . IgG 1, 2, 3, and 4    Standing Status:   Future    Standing Expiration Date:   11/15/2020  . IgE    Standing Status:   Future    Standing Expiration Date:   11/15/2020      Planning for bronchoscopy- will try to do it in the next 2-3 weeks. We will call you to set this up.   Return in about 2 months (around 01/16/2020).    Please do your part to reduce the spread of COVID-19.

## 2019-11-16 NOTE — H&P (View-Only) (Signed)
Synopsis: Referred in 2013 for cough by Lucianne Lei, MD. Formerly a patient of Dr. Chase Caller.  Subjective:   PATIENT ID: Cynthia Martinez GENDER: female DOB: 06/27/1961, MRN: [id number]  Chief Complaint  Patient presents with  . Follow-up    Patient is here per MR for second opinion. Patient has a CT done 11/23. Patient needs to have PFT. Patient had collapsed lung in 2017.     Cynthia Martinez is a 58 year old woman who presents for evaluation of an abnormal CT scan.  She is previously been seen by Dr. Chase Caller.  She has a history of asthma and chronic sinusitis which she follows with ENT.  Her primary care doctor recently started her on Breztri twice daily, which has improved her symptoms.  She seldom has wheezing and currently denies shortness of breath, coughing, and sputum production.  She does not ever use her rescue inhaler.  She has cough and sputum production when her sinusitis is worse.  She has a follow-up appointment with ENT next week; she was recently treated with prednisone and doxycycline for sinusitis a few weeks ago.  She has a nasal polyp on the right, which is the first that she has had that she is aware of.  She has never had worsening of her asthma with aspirin or NSAIDs, but has not taken these medications recently.  She manages her asthma with Flonase, but reports that Zyrtec was not helpful.  She never had sinus issues prior to 3 to 4 years ago.  She quit smoking 10 years ago after 5-6 years of about 4 cigarettes/day.  She has lost about 10 pounds in the last year, but is unsure if this is related to having 3 close relatives passed away.  Her appetite has been good, and she denies fever, chills, sweats, significant fatigue.  She has previously been recommended to have a bronchoscopy, which she has refused in the past.  She is very nervous about an invasive procedure.  She is not on anticoagulation or antiplatelet medications.  Her daughter lives about an hour away and recently  had surgery, but if she had a bronchoscopy her niece could likely drive her.     Past Medical History:  Diagnosis Date  . Anemia   . Anxiety   . COPD (chronic obstructive pulmonary disease) ([place])   . Depression   . DUB (dysfunctional uterine bleeding)   . 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 Hypertension   . Pneumonia due to Haemophilus influenzae ([place]) 09/23/2015  . Shortness of breath dyspnea      Family History  Problem Relation Age of Onset  . Diabetes Other   . Healthy Mother   . Healthy Father      Past Surgical History:  Procedure Laterality Date  . POLYPECTOMY      Social History   Socioeconomic History  . Marital status: Single    Spouse name: Not on file  . Number of children: Not on file  . Years of education: Not on file  . Highest education level: Not on file  Occupational History  . Not on file  Tobacco Use  . Smoking status: Former Smoker    Packs/day: 0.50    Years: 9.00    Pack years: 4.50    Types: Cigarettes    Quit date: 07/19/2012    Years since quitting: 7.3  . Smokeless tobacco: Never Used  Substance and Sexual Activity  . Alcohol use: No  Alcohol/week: 0.0 standard drinks  . Drug use: No  . Sexual activity: Not Currently    Partners: Male    Birth control/protection: Condom  Other Topics Concern  . Not on file  Social History Narrative   Lives with fiancee in [place]   2 daughter in 91's and 47'3.   Worked as Secretary/administrator in past.         Social Determinants of Radio broadcast assistant Strain:   . Difficulty of Paying Living Expenses: Not on file  Food Insecurity:   . Worried About Charity fundraiser in the Last Year: Not on file  . Ran Out of Food in the Last Year: Not on file  Transportation Needs:   . Lack of Transportation (Medical): Not on file  . Lack of Transportation (Non-Medical): Not on file  Physical Activity:   . Days of Exercise per Week: Not on file  . Minutes of Exercise per Session: Not on file    Stress:   . Feeling of Stress : Not on file  Social Connections:   . Frequency of Communication with Friends and Family: Not on file  . Frequency of Social Gatherings with Friends and Family: Not on file  . Attends Religious Services: Not on file  . Active Member of Clubs or Organizations: Not on file  . Attends Archivist Meetings: Not on file  . Marital Status: Not on file  Intimate Partner Violence:   . Fear of Current or Ex-Partner: Not on file  . Emotionally Abused: Not on file  . Physically Abused: Not on file  . Sexually Abused: Not on file     Allergies  Allergen Reactions  . Hydrochlorothiazide Hives  . Penicillins Hives    Tolerated ceftriaxone  Has patient had a PCN reaction causing immediate rash, facial/tongue/throat swelling, SOB or lightheadedness with hypotension:YES Has patient had a PCN reaction causing severe rash involving mucus membranes or skin necrosis: NO Has patient had a PCN reaction that required hospitalization NO Has patient had a PCN reaction occurring within the last 10 years: NO If all of the above answers are "NO", then may proceed with Cephalosporin use.   . Sulfa Antibiotics Hives     Immunization History  Administered Date(s) Administered  . Hepatitis A 05/10/2013  . Hepatitis A, Adult 08/19/2014  . Hepatitis B 05/10/2013  . Hepatitis B, ped/adol 08/19/2014, 08/04/2015  . Influenza,inj,Quad PF,6+ Mos 10/01/2019    Outpatient Medications Prior to Visit  Medication Sig Dispense Refill  . albuterol (VENTOLIN HFA) 108 (90 Base) MCG/ACT inhaler Inhale 2 puffs into the lungs every 4 (four) hours as needed for wheezing. 18 g 6  . amLODipine (NORVASC) 10 MG tablet TAKE 1 TABLET (10 MG TOTAL) BY MOUTH DAILY. 30 tablet 2  . cetirizine (ZYRTEC) 10 MG chewable tablet Chew 1 tablet (10 mg total) by mouth daily. 20 tablet 0  . fluticasone (FLONASE) 50 MCG/ACT nasal spray Place 2 sprays into both nostrils daily. 16 g 0  . hydrOXYzine  (ATARAX/VISTARIL) 50 MG tablet Take 1/2 to 1 to 2 tablets every 8 hours as needed for anxiety and initiate sleep.  Start with 1/2 tablet and advance as tolerated. 30 tablet 0  . predniSONE (DELTASONE) 20 MG tablet Take 1 tablet (20 mg total) by mouth 2 (two) times daily with a meal. 10 tablet 0  . vitamin B-12 (CYANOCOBALAMIN) 1000 MCG tablet Take 1 tablet (1,000 mcg total) by mouth daily. 90 tablet 3  . budesonide (  PULMICORT) 0.5 MG/2ML nebulizer solution Take 2 mLs (0.5 mg total) by nebulization 2 (two) times a day. 75 mL 5  . umeclidinium bromide (INCRUSE ELLIPTA) 62.5 MCG/INH AEPB Inhale 1 puff into the lungs daily. 30 each 11   No facility-administered medications prior to visit.    Review of Systems  Constitutional: Positive for weight loss. Negative for chills and fever.  HENT: Positive for congestion.        Recent sinus infection  Eyes: Negative.   Respiratory: Negative for cough, sputum production, shortness of breath and wheezing.   Cardiovascular: Negative for chest pain and leg swelling.  Gastrointestinal: Negative.   Musculoskeletal: Negative.   Skin: Negative.   Neurological: Negative.   Endo/Heme/Allergies: Positive for environmental allergies.     Objective:   Vitals:   11/16/19 1646  BP: 110/68  Pulse: (!) 120  Temp: 98.1 F (36.7 C)  TempSrc: Temporal  SpO2: 93%  Weight: 89 lb 3.2 oz (40.5 kg)  Height: _0  (1.6 m)   93% on   RA BMI Readings from Last 3 Encounters:  11/16/19 15.80 kg/m  09/16/18 15.27 kg/m  08/06/18 14.88 kg/m   Wt Readings from Last 3 Encounters:  11/16/19 89 lb 3.2 oz (40.5 kg)  09/16/18 86 lb 3.2 oz (39.1 kg)  08/06/18 84 lb (38.1 kg)    Physical Exam Vitals reviewed.  Constitutional:      General: She is not in acute distress.    Appearance: She is not ill-appearing.  HENT:     Head: Normocephalic and atraumatic.     Nose:     Comments: Deferred due to masking requirement.  Nasal sounding voice.    Mouth/Throat:      Comments: Deferred due to masking requirement. Eyes:     General: No scleral icterus. Cardiovascular:     Rate and Rhythm: Normal rate and regular rhythm.     Heart sounds: No murmur.  Pulmonary:     Comments: Breathing comfortably on room air, no wheezing.  Clear to auscultation bilaterally. Abdominal:     General: There is no distension.     Palpations: Abdomen is soft.     Tenderness: There is no abdominal tenderness.  Musculoskeletal:        General: No swelling or deformity.     Cervical back: Neck supple. No rigidity.  Lymphadenopathy:     Cervical: No cervical adenopathy.  Skin:    General: Skin is warm and dry.     Findings: No rash.  Neurological:     General: No focal deficit present.     Mental Status: She is alert.     Motor: No weakness.     Coordination: Coordination normal.  Psychiatric:        Mood and Affect: Mood normal.        Behavior: Behavior normal.      CBC    Component Value Date/Time   WBC 6.5 08/06/2018 1212   WBC 9.7 11/11/2015 0349   RBC 3.16 (L) 08/06/2018 1212   RBC 4.51 11/11/2015 0349   HGB 11.7 08/06/2018 1212   HCT 34.3 08/06/2018 1212   PLT 266 08/06/2018 1212   MCV 109 (H) 08/06/2018 1212   MCH 37.0 (H) 08/06/2018 1212   MCH 28.6 11/11/2015 0349   MCHC 34.1 08/06/2018 1212   MCHC 32.3 11/11/2015 0349   RDW 19.0 (H) 08/06/2018 1212   LYMPHSABS 1.4 08/06/2018 1212   MONOABS 0.9 11/10/2015 1627   EOSABS 0.0 08/06/2018 1212  BASOSABS 0.0 08/06/2018 1212    CHEMISTRY No results for input(s): NA, K, CL, CO2, GLUCOSE, BUN, CREATININE, CALCIUM, MG, PHOS in the last 168 hours. CrCl cannot be calculated (Patient's most recent lab result is older than the maximum 21 days allowed.).   Alpha-1 antitrypsin phenotype MM, 175 (12/27/2015)  Chest Imaging- films reviewed: CT chest 10/11/2019-tree-in-bud opacities throughout bilateral lower lobes, airway thickening throughout, bronchiectasis with some airways occluded with mucus.   Takeoff of RML with opacification with air bronchograms.  Pulmonary Functions Testing Results: No flowsheet data found.      Assessment & Plan:     ICD-10-CM   1. Abnormal CT of the chest  R93.89 IgG, IgA, IgM    IgE    IgG 1, 2, 3, and 4    IgG 1, 2, 3, and 4    IgE    IgG, IgA, IgM    CANCELED: IgG 1, 2, 3, and 4  2. Chronic cough  R05 IgG, IgA, IgM    IgE    IgG 1, 2, 3, and 4    IgG 1, 2, 3, and 4    IgE    IgG, IgA, IgM    CANCELED: IgG 1, 2, 3, and 4  3. Bronchiectasis without complication (HCC)  X52.8 IgG, IgA, IgM    IgE    IgG 1, 2, 3, and 4    IgG 1, 2, 3, and 4    IgE    IgG, IgA, IgM    CANCELED: IgG 1, 2, 3, and 4     Bronchiectasis and diffuse tree-in-bud opacities most notable in the lingula and lower lobes.  I am concerned for chronic infectious process, especially with a history of chronic cough. -Need to obtain respiratory cultures.  Since she is unable to expectorate sputum at this time, bronchoscopy with cultures would be the best way to evaluate further.  She would like to do this in 2 to 3 weeks.  She is not on AP or AC meds.  She understands that she will be unable to drive the day of the procedure and will have to have a ride home.  She will have to have Covid testing 3 days prior to her procedure and quarantine until the time of the procedure.  -Immunoglobulin levels  Abnormal chest CT- RML proximal opacity. -Recommend bronchoscopy for airway evaluation, possible biopsy if needed.  Weight loss -Bronchoscopy with BAL for cultures -We discussed the possibility of a chronic infectious process that would require prolonged course of antibiotics.   RTC in 2 months.  Julian Hy, DO 11/16/19 6:43 PM [facility]    Current Outpatient Medications:  .  albuterol (VENTOLIN HFA) 108 (90 Base) MCG/ACT inhaler, Inhale 2 puffs into the lungs every 4 (four) hours as needed for wheezing., Disp: 18 g, Rfl: 6 .  amLODipine (NORVASC)  10 MG tablet, TAKE 1 TABLET (10 MG TOTAL) BY MOUTH DAILY., Disp: 30 tablet, Rfl: 2 .  cetirizine (ZYRTEC) 10 MG chewable tablet, Chew 1 tablet (10 mg total) by mouth daily., Disp: 20 tablet, Rfl: 0 .  fluticasone (FLONASE) 50 MCG/ACT nasal spray, Place 2 sprays into both nostrils daily., Disp: 16 g, Rfl: 0 .  hydrOXYzine (ATARAX/VISTARIL) 50 MG tablet, Take 1/2 to 1 to 2 tablets every 8 hours as needed for anxiety and initiate sleep.  Start with 1/2 tablet and advance as tolerated., Disp: 30 tablet, Rfl: 0 .  predniSONE (DELTASONE) 20 MG tablet, Take 1  tablet (20 mg total) by mouth 2 (two) times daily with a meal., Disp: 10 tablet, Rfl: 0 .  vitamin B-12 (CYANOCOBALAMIN) 1000 MCG tablet, Take 1 tablet (1,000 mcg total) by mouth daily., Disp: 90 tablet, Rfl: 3   Julian Hy, DO [facility] 11/16/2019 6:43 PM

## 2019-11-16 NOTE — Progress Notes (Signed)
 Synopsis: Referred in 2013 for cough by Bland, Veita, MD. Formerly a patient of Dr. Ramaswamy.  Subjective:   PATIENT ID: Cynthia Martinez GENDER: female DOB: 09/21/1961, MRN: [id number]  Chief Complaint  Patient presents with  . Follow-up    Patient is here per MR for second opinion. Patient has a CT done 11/23. Patient needs to have PFT. Patient had collapsed lung in 2017.     Cynthia Martinez is a 58-year-old woman who presents for evaluation of an abnormal CT scan.  She is previously been seen by Dr. Ramaswamy.  She has a history of asthma and chronic sinusitis which she follows with ENT.  Her primary care doctor recently started her on Breztri twice daily, which has improved her symptoms.  She seldom has wheezing and currently denies shortness of breath, coughing, and sputum production.  She does not ever use her rescue inhaler.  She has cough and sputum production when her sinusitis is worse.  She has a follow-up appointment with ENT next week; she was recently treated with prednisone and doxycycline for sinusitis a few weeks ago.  She has a nasal polyp on the right, which is the first that she has had that she is aware of.  She has never had worsening of her asthma with aspirin or NSAIDs, but has not taken these medications recently.  She manages her asthma with Flonase, but reports that Zyrtec was not helpful.  She never had sinus issues prior to 3 to 4 years ago.  She quit smoking 10 years ago after 5-6 years of about 4 cigarettes/day.  She has lost about 10 pounds in the last year, but is unsure if this is related to having 3 close relatives passed away.  Her appetite has been good, and she denies fever, chills, sweats, significant fatigue.  She has previously been recommended to have a bronchoscopy, which she has refused in the past.  She is very nervous about an invasive procedure.  She is not on anticoagulation or antiplatelet medications.  Her daughter lives about an hour away and recently  had surgery, but if she had a bronchoscopy her niece could likely drive her.     Past Medical History:  Diagnosis Date  . Anemia   . Anxiety   . COPD (chronic obstructive pulmonary disease) (HCC)   . Depression   . DUB (dysfunctional uterine bleeding)   . 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 Hypertension   . Pneumonia due to Haemophilus influenzae (HCC) 09/23/2015  . Shortness of breath dyspnea      Family History  Problem Relation Age of Onset  . Diabetes Other   . Healthy Mother   . Healthy Father      Past Surgical History:  Procedure Laterality Date  . POLYPECTOMY      Social History   Socioeconomic History  . Marital status: Single    Spouse name: Not on file  . Number of children: Not on file  . Years of education: Not on file  . Highest education level: Not on file  Occupational History  . Not on file  Tobacco Use  . Smoking status: Former Smoker    Packs/day: 0.50    Years: 9.00    Pack years: 4.50    Types: Cigarettes    Quit date: 07/19/2012    Years since quitting: 7.3  . Smokeless tobacco: Never Used  Substance and Sexual Activity  . Alcohol use: No      Alcohol/week: 0.0 standard drinks  . Drug use: No  . Sexual activity: Not Currently    Partners: Male    Birth control/protection: Condom  Other Topics Concern  . Not on file  Social History Narrative   Lives with fiancee in [place]   2 daughter in 27's and 81'3.   Worked as Secretary/administrator in past.         Social Determinants of Radio broadcast assistant Strain:   . Difficulty of Paying Living Expenses: Not on file  Food Insecurity:   . Worried About Charity fundraiser in the Last Year: Not on file  . Ran Out of Food in the Last Year: Not on file  Transportation Needs:   . Lack of Transportation (Medical): Not on file  . Lack of Transportation (Non-Medical): Not on file  Physical Activity:   . Days of Exercise per Week: Not on file  . Minutes of Exercise per Session: Not on file   Stress:   . Feeling of Stress : Not on file  Social Connections:   . Frequency of Communication with Friends and Family: Not on file  . Frequency of Social Gatherings with Friends and Family: Not on file  . Attends Religious Services: Not on file  . Active Member of Clubs or Organizations: Not on file  . Attends Archivist Meetings: Not on file  . Marital Status: Not on file  Intimate Partner Violence:   . Fear of Current or Ex-Partner: Not on file  . Emotionally Abused: Not on file  . Physically Abused: Not on file  . Sexually Abused: Not on file     Allergies  Allergen Reactions  . Hydrochlorothiazide Hives  . Penicillins Hives    Tolerated ceftriaxone  Has patient had a PCN reaction causing immediate rash, facial/tongue/throat swelling, SOB or lightheadedness with hypotension:YES Has patient had a PCN reaction causing severe rash involving mucus membranes or skin necrosis: NO Has patient had a PCN reaction that required hospitalization NO Has patient had a PCN reaction occurring within the last 10 years: NO If all of the above answers are "NO", then may proceed with Cephalosporin use.   . Sulfa Antibiotics Hives     Immunization History  Administered Date(s) Administered  . Hepatitis A 05/10/2013  . Hepatitis A, Adult 08/19/2014  . Hepatitis B 05/10/2013  . Hepatitis B, ped/adol 08/19/2014, 08/04/2015  . Influenza,inj,Quad PF,6+ Mos 10/01/2019    Outpatient Medications Prior to Visit  Medication Sig Dispense Refill  . albuterol (VENTOLIN HFA) 108 (90 Base) MCG/ACT inhaler Inhale 2 puffs into the lungs every 4 (four) hours as needed for wheezing. 18 g 6  . amLODipine (NORVASC) 10 MG tablet TAKE 1 TABLET (10 MG TOTAL) BY MOUTH DAILY. 30 tablet 2  . cetirizine (ZYRTEC) 10 MG chewable tablet Chew 1 tablet (10 mg total) by mouth daily. 20 tablet 0  . fluticasone (FLONASE) 50 MCG/ACT nasal spray Place 2 sprays into both nostrils daily. 16 g 0  . hydrOXYzine  (ATARAX/VISTARIL) 50 MG tablet Take 1/2 to 1 to 2 tablets every 8 hours as needed for anxiety and initiate sleep.  Start with 1/2 tablet and advance as tolerated. 30 tablet 0  . predniSONE (DELTASONE) 20 MG tablet Take 1 tablet (20 mg total) by mouth 2 (two) times daily with a meal. 10 tablet 0  . vitamin B-12 (CYANOCOBALAMIN) 1000 MCG tablet Take 1 tablet (1,000 mcg total) by mouth daily. 90 tablet 3  . budesonide (PULMICORT)  0.5 MG/2ML nebulizer solution Take 2 mLs (0.5 mg total) by nebulization 2 (two) times a day. 75 mL 5  . umeclidinium bromide (INCRUSE ELLIPTA) 62.5 MCG/INH AEPB Inhale 1 puff into the lungs daily. 30 each 11   No facility-administered medications prior to visit.    Review of Systems  Constitutional: Positive for weight loss. Negative for chills and fever.  HENT: Positive for congestion.        Recent sinus infection  Eyes: Negative.   Respiratory: Negative for cough, sputum production, shortness of breath and wheezing.   Cardiovascular: Negative for chest pain and leg swelling.  Gastrointestinal: Negative.   Musculoskeletal: Negative.   Skin: Negative.   Neurological: Negative.   Endo/Heme/Allergies: Positive for environmental allergies.     Objective:   Vitals:   11/16/19 1646  BP: 110/68  Pulse: (!) 120  Temp: 98.1 F (36.7 C)  TempSrc: Temporal  SpO2: 93%  Weight: 89 lb 3.2 oz (40.5 kg)  Height: _0  (1.6 m)   93% on   RA BMI Readings from Last 3 Encounters:  11/16/19 15.80 kg/m  09/16/18 15.27 kg/m  08/06/18 14.88 kg/m   Wt Readings from Last 3 Encounters:  11/16/19 89 lb 3.2 oz (40.5 kg)  09/16/18 86 lb 3.2 oz (39.1 kg)  08/06/18 84 lb (38.1 kg)    Physical Exam Vitals reviewed.  Constitutional:      General: She is not in acute distress.    Appearance: She is not ill-appearing.  HENT:     Head: Normocephalic and atraumatic.     Nose:     Comments: Deferred due to masking requirement.  Nasal sounding voice.    Mouth/Throat:      Comments: Deferred due to masking requirement. Eyes:     General: No scleral icterus. Cardiovascular:     Rate and Rhythm: Normal rate and regular rhythm.     Heart sounds: No murmur.  Pulmonary:     Comments: Breathing comfortably on room air, no wheezing.  Clear to auscultation bilaterally. Abdominal:     General: There is no distension.     Palpations: Abdomen is soft.     Tenderness: There is no abdominal tenderness.  Musculoskeletal:        General: No swelling or deformity.     Cervical back: Neck supple. No rigidity.  Lymphadenopathy:     Cervical: No cervical adenopathy.  Skin:    General: Skin is warm and dry.     Findings: No rash.  Neurological:     General: No focal deficit present.     Mental Status: She is alert.     Motor: No weakness.     Coordination: Coordination normal.  Psychiatric:        Mood and Affect: Mood normal.        Behavior: Behavior normal.      CBC    Component Value Date/Time   WBC 6.5 08/06/2018 1212   WBC 9.7 11/11/2015 0349   RBC 3.16 (L) 08/06/2018 1212   RBC 4.51 11/11/2015 0349   HGB 11.7 08/06/2018 1212   HCT 34.3 08/06/2018 1212   PLT 266 08/06/2018 1212   MCV 109 (H) 08/06/2018 1212   MCH 37.0 (H) 08/06/2018 1212   MCH 28.6 11/11/2015 0349   MCHC 34.1 08/06/2018 1212   MCHC 32.3 11/11/2015 0349   RDW 19.0 (H) 08/06/2018 1212   LYMPHSABS 1.4 08/06/2018 1212   MONOABS 0.9 11/10/2015 1627   EOSABS 0.0 08/06/2018 1212  BASOSABS 0.0 08/06/2018 1212    CHEMISTRY No results for input(s): NA, K, CL, CO2, GLUCOSE, BUN, CREATININE, CALCIUM, MG, PHOS in the last 168 hours. CrCl cannot be calculated (Patient's most recent lab result is older than the maximum 21 days allowed.).   Alpha-1 antitrypsin phenotype MM, 175 (12/27/2015)  Chest Imaging- films reviewed: CT chest 10/11/2019-tree-in-bud opacities throughout bilateral lower lobes, airway thickening throughout, bronchiectasis with some airways occluded with mucus.   Takeoff of RML with opacification with air bronchograms.  Pulmonary Functions Testing Results: No flowsheet data found.      Assessment & Plan:     ICD-10-CM   1. Abnormal CT of the chest  R93.89 IgG, IgA, IgM    IgE    IgG 1, 2, 3, and 4    IgG 1, 2, 3, and 4    IgE    IgG, IgA, IgM    CANCELED: IgG 1, 2, 3, and 4  2. Chronic cough  R05 IgG, IgA, IgM    IgE    IgG 1, 2, 3, and 4    IgG 1, 2, 3, and 4    IgE    IgG, IgA, IgM    CANCELED: IgG 1, 2, 3, and 4  3. Bronchiectasis without complication (HCC)  J47.9 IgG, IgA, IgM    IgE    IgG 1, 2, 3, and 4    IgG 1, 2, 3, and 4    IgE    IgG, IgA, IgM    CANCELED: IgG 1, 2, 3, and 4     Bronchiectasis and diffuse tree-in-bud opacities most notable in the lingula and lower lobes.  I am concerned for chronic infectious process, especially with a history of chronic cough. -Need to obtain respiratory cultures.  Since she is unable to expectorate sputum at this time, bronchoscopy with cultures would be the best way to evaluate further.  She would like to do this in 2 to 3 weeks.  She is not on AP or AC meds.  She understands that she will be unable to drive the day of the procedure and will have to have a ride home.  She will have to have Covid testing 3 days prior to her procedure and quarantine until the time of the procedure.  -Immunoglobulin levels  Abnormal chest CT- RML proximal opacity. -Recommend bronchoscopy for airway evaluation, possible biopsy if needed.  Weight loss -Bronchoscopy with BAL for cultures -We discussed the possibility of a chronic infectious process that would require prolonged course of antibiotics.   RTC in 2 months.  Alandra Sando P Wylie Russon, DO 11/16/19 6:43 PM [facility]    Current Outpatient Medications:  .  albuterol (VENTOLIN HFA) 108 (90 Base) MCG/ACT inhaler, Inhale 2 puffs into the lungs every 4 (four) hours as needed for wheezing., Disp: 18 g, Rfl: 6 .  amLODipine (NORVASC)  10 MG tablet, TAKE 1 TABLET (10 MG TOTAL) BY MOUTH DAILY., Disp: 30 tablet, Rfl: 2 .  cetirizine (ZYRTEC) 10 MG chewable tablet, Chew 1 tablet (10 mg total) by mouth daily., Disp: 20 tablet, Rfl: 0 .  fluticasone (FLONASE) 50 MCG/ACT nasal spray, Place 2 sprays into both nostrils daily., Disp: 16 g, Rfl: 0 .  hydrOXYzine (ATARAX/VISTARIL) 50 MG tablet, Take 1/2 to 1 to 2 tablets every 8 hours as needed for anxiety and initiate sleep.  Start with 1/2 tablet and advance as tolerated., Disp: 30 tablet, Rfl: 0 .  predniSONE (DELTASONE) 20 MG tablet, Take 1   tablet (20 mg total) by mouth 2 (two) times daily with a meal., Disp: 10 tablet, Rfl: 0 .  vitamin B-12 (CYANOCOBALAMIN) 1000 MCG tablet, Take 1 tablet (1,000 mcg total) by mouth daily., Disp: 90 tablet, Rfl: 3   Cataleia Gade P Jahaad Penado, DO [facility] 11/16/2019 6:43 PM  

## 2019-11-17 ENCOUNTER — Telehealth: Payer: Self-pay | Admitting: Critical Care Medicine

## 2019-11-17 DIAGNOSIS — I10 Essential (primary) hypertension: Secondary | ICD-10-CM | POA: Diagnosis not present

## 2019-11-17 LAB — IGG, IGA, IGM
IgG (Immunoglobin G), Serum: 1297 mg/dL (ref 600–1640)
IgM, Serum: 253 mg/dL (ref 50–300)
Immunoglobulin A: 379 mg/dL — ABNORMAL HIGH (ref 47–310)

## 2019-11-17 LAB — IGE: IgE (Immunoglobulin E), Serum: <2 kU/L (ref <=114)

## 2019-11-17 NOTE — Addendum Note (Signed)
Addended by: Lia Foyer R on: 11/17/2019 11:24 AM   Modules accepted: Orders

## 2019-11-17 NOTE — Telephone Encounter (Signed)
Called the patient back and recommended that she write her questions down and take them to the preop appointment which will discuss in detail what will be done on actual procedure day.  Patient stated she was doing some research on it and was wondering about it. I advised she needs to be careful with google/internet searches because you can come across inaccurate information in addition that every patient need is different. Patient voiced understanding, nothing further needed at this time.

## 2019-11-17 NOTE — Addendum Note (Signed)
Addended by: Lia Foyer R on: 11/17/2019 02:55 PM   Modules accepted: Orders

## 2019-11-18 LAB — IGG 1, 2, 3, AND 4
IgG (Immunoglobin G), Serum: 1223 mg/dL (ref 586–1602)
IgG, Subclass 1: 855 mg/dL — ABNORMAL HIGH (ref 248–810)
IgG, Subclass 2: 162 mg/dL (ref 130–555)
IgG, Subclass 3: 48 mg/dL (ref 15–102)
IgG, Subclass 4: 1 mg/dL — ABNORMAL LOW (ref 2–96)

## 2019-11-18 NOTE — Progress Notes (Signed)
Spoke with pt and notified of results per Dr. Wert. Pt verbalized understanding and denied any questions. 

## 2019-11-19 ENCOUNTER — Other Ambulatory Visit: Payer: Self-pay | Admitting: Critical Care Medicine

## 2019-11-19 DIAGNOSIS — D803 Selective deficiency of immunoglobulin G [IgG] subclasses: Secondary | ICD-10-CM

## 2019-11-19 DIAGNOSIS — J479 Bronchiectasis, uncomplicated: Secondary | ICD-10-CM

## 2019-11-19 NOTE — Progress Notes (Signed)
Referral to Immunology placed- low IgG4 & IgE  Julian Hy, DO 11/19/19 6:42 PM [facility]

## 2019-11-22 ENCOUNTER — Telehealth: Payer: Self-pay | Admitting: Critical Care Medicine

## 2019-11-22 NOTE — Progress Notes (Signed)
Patient aware of results and alllergy referral.. Notified that they left a vmail this morning so please check and call to schedule.

## 2019-11-22 NOTE — Telephone Encounter (Signed)
I spoke to Cynthia Martinez about her antibody levels and referral to Immunology.  She has an appt on Feb 16. All questions were answered.  Julian Hy, DO 11/22/19 2:50 PM [facility]

## 2019-11-26 DIAGNOSIS — J449 Chronic obstructive pulmonary disease, unspecified: Secondary | ICD-10-CM | POA: Diagnosis not present

## 2019-11-30 NOTE — Pre-Procedure Instructions (Signed)
[facility] [place], [place] AT [facility] OF [address] [place] [address] Phone: [phone] Fax: [phone]      Your procedure is scheduled on Monday, January 18th.  Report to [facility] Main Entrance "A" at 8:00 A.M., and check in at the Admitting office.  Call this number if you have problems the morning of surgery:  [phone]  Call [phone] if you have any questions prior to your surgery date Monday-Friday 8am-4pm    Remember:  Do not eat or drink after midnight the night before your surgery    Take these medicines the morning of surgery with A SIP OF WATER  amLODipine (NORVASC) Inhaler-please bring inhaler with you to the hospital.   As of today, STOP taking any Aspirin (unless otherwise instructed by your surgeon), Aleve, Naproxen, Ibuprofen, Motrin, Advil, Goody's, BC's, all herbal medications, fish oil, and all vitamins.    The Morning of Surgery  Do not wear jewelry, make-up or nail polish.  Do not wear lotions, powders, or perfumes, or deodorant  Do not shave 48 hours prior to surgery.    Do not bring valuables to the hospital.  [facility] is not responsible for any belongings or valuables.  If you are a smoker, DO NOT Smoke 24 hours prior to surgery  If you wear a CPAP at night please bring your mask, tubing, and machine the morning of surgery   Remember that you must have someone to transport you home after your surgery, and remain with you for 24 hours if you are discharged the same day.   Please bring cases for contacts, glasses, hearing aids, dentures or bridgework because it cannot be worn into surgery.    Leave your suitcase in the car.  After surgery it may be brought to your room.  For patients admitted to the hospital, discharge time will be determined by your treatment team.  Patients discharged the day of surgery will not be allowed to drive home.    Special instructions:    [facility]- Preparing For Surgery  Before surgery, you can play an important role. Because skin is not sterile, your skin needs to be as free of germs as possible. You can reduce the number of germs on your skin by washing with CHG (chlorahexidine gluconate) Soap before surgery.  CHG is an antiseptic cleaner which kills germs and bonds with the skin to continue killing germs even after washing.    Oral Hygiene is also important to reduce your risk of infection.  Remember - BRUSH YOUR TEETH THE MORNING OF SURGERY WITH YOUR REGULAR TOOTHPASTE  Please do not use if you have an allergy to CHG or antibacterial soaps. If your skin becomes reddened/irritated stop using the CHG.  Do not shave (including legs and underarms) for at least 48 hours prior to first CHG shower. It is OK to shave your face.  Please follow these instructions carefully.   1. Shower the NIGHT BEFORE SURGERY and the MORNING OF SURGERY with CHG Soap.   2. If you chose to wash your hair, wash your hair first as usual with your normal shampoo.  3. After you shampoo, rinse your hair and body thoroughly to remove the shampoo.  4. Use CHG as you would any other liquid soap. You can apply CHG directly to the skin and wash gently with a scrungie or a clean washcloth.   5. Apply the CHG Soap  to your body ONLY FROM THE NECK DOWN.  Do not use on open wounds or open sores. Avoid contact with your eyes, ears, mouth and genitals (private parts). Wash Face and genitals (private parts)  with your normal soap.   6. Wash thoroughly, paying special attention to the area where your surgery will be performed.  7. Thoroughly rinse your body with warm water from the neck down.  8. DO NOT shower/wash with your normal soap after using and rinsing off the CHG Soap.  9. Pat yourself dry with a CLEAN TOWEL.  10. Wear CLEAN PAJAMAS to bed the night before surgery, wear comfortable clothes the morning of surgery  11. Place CLEAN SHEETS on your bed  the night of your first shower and DO NOT SLEEP WITH PETS.    Day of Surgery:  Please shower the morning of surgery with the CHG soap Do not apply any deodorants/lotions. Please wear clean clothes to the hospital/surgery center.   Remember to brush your teeth WITH YOUR REGULAR TOOTHPASTE.   Please read over the following fact sheets that you were given.

## 2019-12-01 ENCOUNTER — Encounter (HOSPITAL_COMMUNITY): Payer: Self-pay

## 2019-12-01 ENCOUNTER — Other Ambulatory Visit: Payer: Self-pay

## 2019-12-01 ENCOUNTER — Other Ambulatory Visit: Payer: Self-pay | Admitting: Critical Care Medicine

## 2019-12-01 ENCOUNTER — Telehealth: Payer: Self-pay | Admitting: Critical Care Medicine

## 2019-12-01 ENCOUNTER — Encounter (HOSPITAL_COMMUNITY)
Admission: RE | Admit: 2019-12-01 | Discharge: 2019-12-01 | Disposition: A | Discharge status: Home / Self Care | Payer: Medicare Other | Source: Ambulatory Visit | Attending: Critical Care Medicine | Admitting: Critical Care Medicine

## 2019-12-01 DIAGNOSIS — J449 Chronic obstructive pulmonary disease, unspecified: Secondary | ICD-10-CM | POA: Insufficient documentation

## 2019-12-01 DIAGNOSIS — R9431 Abnormal electrocardiogram [ECG] [EKG]: Secondary | ICD-10-CM | POA: Diagnosis not present

## 2019-12-01 DIAGNOSIS — Z79899 Other long term (current) drug therapy: Secondary | ICD-10-CM | POA: Insufficient documentation

## 2019-12-01 DIAGNOSIS — R000 Tachycardia, unspecified: Secondary | ICD-10-CM | POA: Insufficient documentation

## 2019-12-01 DIAGNOSIS — Z01818 Encounter for other preprocedural examination: Secondary | ICD-10-CM | POA: Insufficient documentation

## 2019-12-01 DIAGNOSIS — Z87891 Personal history of nicotine dependence: Secondary | ICD-10-CM | POA: Diagnosis not present

## 2019-12-01 DIAGNOSIS — I10 Essential (primary) hypertension: Secondary | ICD-10-CM | POA: Insufficient documentation

## 2019-12-01 DIAGNOSIS — Z7952 Long term (current) use of systemic steroids: Secondary | ICD-10-CM | POA: Insufficient documentation

## 2019-12-01 DIAGNOSIS — R918 Other nonspecific abnormal finding of lung field: Secondary | ICD-10-CM | POA: Diagnosis not present

## 2019-12-01 DIAGNOSIS — J984 Other disorders of lung: Secondary | ICD-10-CM | POA: Diagnosis not present

## 2019-12-01 HISTORY — DX: Family history of other specified conditions: Z84.89

## 2019-12-01 LAB — CBC
HCT: 32.5 % — ABNORMAL LOW (ref 36.0–46.0)
Hemoglobin: 10.7 g/dL — ABNORMAL LOW (ref 12.0–15.0)
MCH: 39.5 pg — ABNORMAL HIGH (ref 26.0–34.0)
MCHC: 32.9 g/dL (ref 30.0–36.0)
MCV: 119.9 fL — ABNORMAL HIGH (ref 80.0–100.0)
Platelets: 325 10*3/uL (ref 150–400)
RBC: 2.71 MIL/uL — ABNORMAL LOW (ref 3.87–5.11)
RDW: 17.2 % — ABNORMAL HIGH (ref 11.5–15.5)
WBC: 5.1 10*3/uL (ref 4.0–10.5)
nRBC: 0 % (ref 0.0–0.2)

## 2019-12-01 LAB — COMPREHENSIVE METABOLIC PANEL
ALT: 11 U/L (ref 0–44)
AST: 19 U/L (ref 15–41)
Albumin: 4.2 g/dL (ref 3.5–5.0)
Alkaline Phosphatase: 61 U/L (ref 38–126)
Anion gap: 9 (ref 5–15)
BUN: 11 mg/dL (ref 6–20)
CO2: 25 mmol/L (ref 22–32)
Calcium: 9.4 mg/dL (ref 8.9–10.3)
Chloride: 106 mmol/L (ref 98–111)
Creatinine, Ser: 0.54 mg/dL (ref 0.44–1.00)
GFR calc Af Amer: >60 mL/min (ref >60)
GFR calc non Af Amer: >60 mL/min (ref >60)
Glucose, Bld: 91 mg/dL (ref 70–99)
Potassium: 3.6 mmol/L (ref 3.5–5.1)
Sodium: 140 mmol/L (ref 135–145)
Total Bilirubin: 1.7 mg/dL — ABNORMAL HIGH (ref 0.3–1.2)
Total Protein: 7.7 g/dL (ref 6.5–8.1)

## 2019-12-01 NOTE — Progress Notes (Signed)
PCP - veita bland Cardiologist -  na    Chest x-ray - na EKG - 12/01/19 Stress Test - na ECHO - na Cardiac Cath - na  Sleep Study - na CPAP -   Fasting Blood Sugar - na Checks Blood Sugar _____ times a day  Blood Thinner Instructions:na Aspirin Instructions:na   COVID TEST- 12/03/19   Anesthesia review:   Patient denies shortness of breath, fever, cough and chest pain at PAT appointment   All instructions explained to the patient, with a verbal understanding of the material. Patient agrees to go over the instructions while at home for a better understanding. Patient also instructed to self quarantine after being tested for COVID-19. The opportunity to ask questions was provided.

## 2019-12-01 NOTE — Telephone Encounter (Signed)
I don't know yet. I will let her know after her [place] what we need to do.  LPC

## 2019-12-01 NOTE — Telephone Encounter (Signed)
Pt is scheduled to have a bronch 12/06/19 and also scheduled to have the [place] 1/22.  Called and spoke with pt who was calling about her lab work and is wanting to know if she should be concerned with the results. Pt said she knows that she is scheduled to have an ultrasound performed but with the total bilirubin being 1.7, she wants to know if she should be concerned about this.  Dr. Carlis Abbott, please advise on this for pt. Thanks!

## 2019-12-01 NOTE — Telephone Encounter (Signed)
Called and spoke with pt letting her know the info stated by Dr. Carlis Abbott and she verbalized understanding. Nothing further needed at this time.

## 2019-12-01 NOTE — Progress Notes (Signed)
RUQ ordered to evaluate hyperbilirubinemia  Julian Hy, DO 12/01/19 12:55 PM [facility]

## 2019-12-02 ENCOUNTER — Telehealth: Payer: Self-pay | Admitting: Critical Care Medicine

## 2019-12-02 NOTE — Progress Notes (Signed)
Anesthesia Chart Review:  Case: [id number] Date/Time: 12/06/19 0945   Procedure: VIDEO BRONCHOSCOPY WITH ENDOBRONCHIAL ULTRASOUND (N/A )   Anesthesia type: General   Pre-op diagnosis: abnormal ct of chest, chronic cough, bronchiectasis without complication   Location: [facility] OR ROOM 08 / [facility]   Surgeons: Julian Hy, DO      DISCUSSION: Patient is a 59 year old female scheduled for the above procedure. Recently seen by Dr. Carlis Abbott as a second opinion for chronic cough. CT imaging showed findings suggestive of atypical infectious process such as MAI, COPD, and mild bilateral bronchiectasis, 6 mm RLL pulmonary nodules (may be infectious/inflammatory, 3 month f/u rec), and chronic RML collapse with central bronchiectasis. Given concern for chronic infectious process, respiratory cultures recommended, but since she is unable to expectorate sputum, bronchoscopy with cultures scheduled.  History includes former smoker (quit 07/19/12), HTN, hepatitis C (diagnosed 2013, s/p Harvoni, considered cure 08/2015), chronic sinusitis, COPD, exertional dyspnea, anxiety, anemia, dysfunctional uterine bleeding (s/p D&C 06/14/05). Sister had a rash during a surgery as a teenager.   Pre-procedure COVID-19 test is scheduled for 12/03/19. If negative and otherwise no acute changes then I would anticipate that she can proceed as planned.   VS: BP 123/78   Pulse 99   Temp 36.5 C (Oral)   Resp 18   Ht 5\' 3"  (1.6 m)   Wt 39.6 kg   SpO2 97%   BMI 15.45 kg/m   PROVIDERS: Lucianne Lei, MD is PCP Lind Guest, MD is ENT ([facility]). Seen 10/29/19 for nasal polyposis, chronic sinusitis, and thrush. Previously saw Brand Males, MD.   LABS: Labs reviewed: Acceptable for surgery. Total bilirubin 1.7 with otherwise normal LFTs. PLT count 325K. Dr. Carlis Abbott has ordered an abdominal US since elevated bilirubin (scheduled for 12/10/19).  (all labs ordered are listed, but only abnormal results are  displayed)  Labs Reviewed  COMPREHENSIVE METABOLIC PANEL - Abnormal; Notable for the following components:      Result Value   Total Bilirubin 1.7 (*)    All other components within normal limits  CBC - Abnormal; Notable for the following components:   RBC 2.71 (*)    Hemoglobin 10.7 (*)    HCT 32.5 (*)    MCV 119.9 (*)    MCH 39.5 (*)    RDW 17.2 (*)    All other components within normal limits     IMAGES: CT Chest 10/11/19: IMPRESSION: 1. Increased tree-in-bud opacities in bilateral mid and lower lung zones, likely due to atypical infectious process such as MAI. 2. Stable COPD and mild bilateral bronchiectasis. 3. 6 mm right lower lobe pulmonary nodule shows mild increase in size, but may be infectious or inflammatory in etiology given the associated findings described above. Recommend continued follow-up by chest CT in 3 months. 4. Chronic right middle lobe collapse and central bronchiectasis. 5. No evidence of lymphadenopathy or pleural effusion.    EKG: 12/01/19: Sinus tachycardia at 112 bpm Right axis deviation Pulmonary disease pattern Abnormal ECG No significant change since last tracing Confirmed by Mertie Moores [phone]) on 12/01/2019 4:55:19 PM   CV: N/A   Past Medical History:  Diagnosis Date  . Anemia   . Anxiety   . COPD (chronic obstructive pulmonary disease) ([place])   . Depression    denies  . DUB (dysfunctional uterine bleeding)   . Family history of adverse reaction to anesthesia    ? 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 Hypertension   . Pneumonia due to Haemophilus influenzae ([place]) 09/23/2015  . Shortness of breath dyspnea     Past Surgical History:  Procedure Laterality Date  . POLYPECTOMY      MEDICATIONS: . amLODipine (NORVASC) 10 MG tablet  . Budeson-Glycopyrrol-Formoterol (BREZTRI AEROSPHERE) 160-9-4.8 MCG/ACT AERO  . cetirizine (ZYRTEC) 10 MG  chewable tablet  . fluticasone (FLONASE) 50 MCG/ACT nasal spray  . predniSONE (DELTASONE) 20 MG tablet  . vitamin B-12 (CYANOCOBALAMIN) 1000 MCG tablet   No current facility-administered medications for this encounter.  By current medication list, she is not currently taking cetrizine, fluticasone, prednisone, vitamin B-12.   Myra Gianotti, PA-C Surgical Short Stay/Anesthesiology [facility] Phone [phone] [facility] Phone [phone] 12/02/2019 12:47 PM

## 2019-12-02 NOTE — Telephone Encounter (Signed)
Spoke with patient. She stated that she is scheduled for a pre-procedure COVID test tomorrow but has repairmen coming to her house to fix her thermostat. She wanted to know if it would be ok for her to allow them in her home after her COVID test.   Explained to patient that as long as everyone has on a mask and keeps their mask on, it should be ok. Maintain at least 22ft of social distancing. She verbalized understanding.   Nothing further needed at time of call.

## 2019-12-02 NOTE — Anesthesia Preprocedure Evaluation (Addendum)
Anesthesia Evaluation  Patient identified by MRN, date of birth, ID band Patient awake    Reviewed: Allergy & Precautions, NPO status , Patient's Chart, lab work & pertinent test results  Airway Mallampati: I  TM Distance: >3 FB Neck ROM: Full    Dental   Pulmonary COPD, former smoker,    Pulmonary exam normal        Cardiovascular hypertension, Pt. on medications Normal cardiovascular exam     Neuro/Psych Anxiety Depression    GI/Hepatic (+) Hepatitis -, C  Endo/Other    Renal/GU      Musculoskeletal   Abdominal   Peds  Hematology   Anesthesia Other Findings   Reproductive/Obstetrics                            Anesthesia Physical Anesthesia Plan  ASA: III  Anesthesia Plan: General   Post-op Pain Management:    Induction: Intravenous  PONV Risk Score and Plan: 3 and Ondansetron, Midazolam and Treatment may vary due to age or medical condition  Airway Management Planned: Oral ETT  Additional Equipment:   Intra-op Plan:   Post-operative Plan: Extubation in OR  Informed Consent: I have reviewed the patients History and Physical, chart, labs and discussed the procedure including the risks, benefits and alternatives for the proposed anesthesia with the patient or authorized representative who has indicated his/her understanding and acceptance.       Plan Discussed with: CRNA and Surgeon  Anesthesia Plan Comments: (PAT note written 12/02/2019 by Myra Gianotti, PA-C. )       Anesthesia Quick Evaluation

## 2019-12-03 ENCOUNTER — Other Ambulatory Visit (HOSPITAL_COMMUNITY)
Admission: RE | Admit: 2019-12-03 | Discharge: 2019-12-03 | Disposition: A | Discharge status: Home / Self Care | Payer: Medicare Other | Source: Ambulatory Visit | Attending: Critical Care Medicine | Admitting: Critical Care Medicine

## 2019-12-03 DIAGNOSIS — Z01812 Encounter for preprocedural laboratory examination: Secondary | ICD-10-CM | POA: Insufficient documentation

## 2019-12-03 DIAGNOSIS — Z20822 Contact with and (suspected) exposure to covid-19: Secondary | ICD-10-CM | POA: Insufficient documentation

## 2019-12-03 LAB — SARS CORONAVIRUS 2 (TAT 6-24 HRS): SARS Coronavirus 2: NEGATIVE

## 2019-12-06 ENCOUNTER — Ambulatory Visit (HOSPITAL_COMMUNITY): Payer: Medicare Other | Admitting: Certified Registered Nurse Anesthetist

## 2019-12-06 ENCOUNTER — Encounter (HOSPITAL_COMMUNITY)
Admission: RE | Disposition: A | Discharge status: Home / Self Care | Payer: Self-pay | Source: Home / Self Care | Attending: Critical Care Medicine

## 2019-12-06 ENCOUNTER — Ambulatory Visit (HOSPITAL_COMMUNITY)
Admission: RE | Admit: 2019-12-06 | Discharge: 2019-12-06 | Disposition: A | Discharge status: Home / Self Care | Payer: Medicare Other | Attending: Critical Care Medicine | Admitting: Critical Care Medicine

## 2019-12-06 ENCOUNTER — Ambulatory Visit (HOSPITAL_COMMUNITY): Payer: Medicare Other | Admitting: Vascular Surgery

## 2019-12-06 ENCOUNTER — Encounter (HOSPITAL_COMMUNITY): Payer: Self-pay | Admitting: Critical Care Medicine

## 2019-12-06 DIAGNOSIS — R918 Other nonspecific abnormal finding of lung field: Secondary | ICD-10-CM | POA: Diagnosis not present

## 2019-12-06 DIAGNOSIS — Z79899 Other long term (current) drug therapy: Secondary | ICD-10-CM | POA: Insufficient documentation

## 2019-12-06 DIAGNOSIS — Z87891 Personal history of nicotine dependence: Secondary | ICD-10-CM | POA: Insufficient documentation

## 2019-12-06 DIAGNOSIS — Z7952 Long term (current) use of systemic steroids: Secondary | ICD-10-CM | POA: Insufficient documentation

## 2019-12-06 DIAGNOSIS — R634 Abnormal weight loss: Secondary | ICD-10-CM | POA: Insufficient documentation

## 2019-12-06 DIAGNOSIS — J189 Pneumonia, unspecified organism: Secondary | ICD-10-CM | POA: Diagnosis not present

## 2019-12-06 DIAGNOSIS — J470 Bronchiectasis with acute lower respiratory infection: Secondary | ICD-10-CM | POA: Diagnosis not present

## 2019-12-06 DIAGNOSIS — I10 Essential (primary) hypertension: Secondary | ICD-10-CM | POA: Insufficient documentation

## 2019-12-06 DIAGNOSIS — J309 Allergic rhinitis, unspecified: Secondary | ICD-10-CM | POA: Diagnosis not present

## 2019-12-06 DIAGNOSIS — J479 Bronchiectasis, uncomplicated: Secondary | ICD-10-CM | POA: Insufficient documentation

## 2019-12-06 DIAGNOSIS — R05 Cough: Secondary | ICD-10-CM | POA: Insufficient documentation

## 2019-12-06 HISTORY — PX: VIDEO BRONCHOSCOPY WITH ENDOBRONCHIAL ULTRASOUND: SHX6177

## 2019-12-06 SURGERY — BRONCHOSCOPY, WITH EBUS
Anesthesia: General

## 2019-12-06 MED ORDER — SUGAMMADEX SODIUM 200 MG/2ML IV SOLN
INTRAVENOUS | Status: DC | PRN
  Administered 2019-12-06: 200 mg via INTRAVENOUS

## 2019-12-06 MED ORDER — MIDAZOLAM HCL 2 MG/2ML IJ SOLN
INTRAMUSCULAR | Status: DC | PRN
  Administered 2019-12-06: 2 mg via INTRAVENOUS

## 2019-12-06 MED ORDER — BUTAMBEN-TETRACAINE-BENZOCAINE 2-2-14 % EX AERO
1.0000 | INHALATION_SPRAY | Freq: Once | CUTANEOUS | Status: DC
  Filled 2019-12-06: qty 20

## 2019-12-06 MED ORDER — ONDANSETRON HCL 4 MG/2ML IJ SOLN
INTRAMUSCULAR | Status: DC | PRN
  Administered 2019-12-06: 4 mg via INTRAVENOUS

## 2019-12-06 MED ORDER — PROPOFOL 10 MG/ML IV BOLUS
INTRAVENOUS | Status: DC | PRN
  Administered 2019-12-06: 100 mg via INTRAVENOUS

## 2019-12-06 MED ORDER — LACTATED RINGERS IV SOLN: INTRAVENOUS | Status: DC

## 2019-12-06 MED ORDER — PROPOFOL 500 MG/50ML IV EMUL
INTRAVENOUS | Status: DC | PRN
  Administered 2019-12-06: 50 ug/kg/min via INTRAVENOUS

## 2019-12-06 MED ORDER — LIDOCAINE HCL URETHRAL/MUCOSAL 2 % EX GEL
1.0000 "application " | Freq: Once | CUTANEOUS | Status: DC
  Filled 2019-12-06: qty 5

## 2019-12-06 MED ORDER — IPRATROPIUM-ALBUTEROL 0.5-2.5 (3) MG/3ML IN SOLN
RESPIRATORY_TRACT | Status: AC
  Filled 2019-12-06: qty 3

## 2019-12-06 MED ORDER — IPRATROPIUM-ALBUTEROL 0.5-2.5 (3) MG/3ML IN SOLN
3.0000 mL | RESPIRATORY_TRACT | Status: DC
  Administered 2019-12-06 (×2): 3 mL via RESPIRATORY_TRACT

## 2019-12-06 MED ORDER — FENTANYL CITRATE (PF) 250 MCG/5ML IJ SOLN
INTRAMUSCULAR | Status: DC | PRN
  Administered 2019-12-06: 150 ug via INTRAVENOUS

## 2019-12-06 MED ORDER — ROCURONIUM BROMIDE 10 MG/ML (PF) SYRINGE
PREFILLED_SYRINGE | INTRAVENOUS | Status: AC
  Filled 2019-12-06: qty 10

## 2019-12-06 MED ORDER — PHENYLEPHRINE HCL-NACL 10-0.9 MG/250ML-% IV SOLN
INTRAVENOUS | Status: DC | PRN
  Administered 2019-12-06: 25 ug/min via INTRAVENOUS

## 2019-12-06 MED ORDER — DEXAMETHASONE SODIUM PHOSPHATE 10 MG/ML IJ SOLN
INTRAMUSCULAR | Status: DC | PRN
  Administered 2019-12-06: 5 mg via INTRAVENOUS

## 2019-12-06 MED ORDER — LIDOCAINE 2% (20 MG/ML) 5 ML SYRINGE
INTRAMUSCULAR | Status: DC | PRN
  Administered 2019-12-06: 100 mg via INTRAVENOUS

## 2019-12-06 MED ORDER — ONDANSETRON HCL 4 MG/2ML IJ SOLN
INTRAMUSCULAR | Status: AC
  Filled 2019-12-06: qty 2

## 2019-12-06 MED ORDER — MIDAZOLAM HCL 2 MG/2ML IJ SOLN
INTRAMUSCULAR | Status: AC
  Filled 2019-12-06: qty 2

## 2019-12-06 MED ORDER — FENTANYL CITRATE (PF) 250 MCG/5ML IJ SOLN
INTRAMUSCULAR | Status: AC
  Filled 2019-12-06: qty 5

## 2019-12-06 MED ORDER — DIPHENHYDRAMINE HCL 50 MG/ML IJ SOLN
INTRAMUSCULAR | Status: DC | PRN
  Administered 2019-12-06: 12.5 mg via INTRAVENOUS

## 2019-12-06 MED ORDER — PROPOFOL 10 MG/ML IV BOLUS
INTRAVENOUS | Status: AC
  Filled 2019-12-06: qty 20

## 2019-12-06 MED ORDER — SODIUM CHLORIDE 0.9 % IR SOLN
Status: DC | PRN
  Administered 2019-12-06: 1

## 2019-12-06 MED ORDER — PHENYLEPHRINE HCL 0.25 % NA SOLN
1.0000 | Freq: Four times a day (QID) | NASAL | Status: DC | PRN
  Filled 2019-12-06: qty 15

## 2019-12-06 MED ORDER — DIPHENHYDRAMINE HCL 50 MG/ML IJ SOLN
INTRAMUSCULAR | Status: AC
  Filled 2019-12-06: qty 1

## 2019-12-06 MED ORDER — DEXAMETHASONE SODIUM PHOSPHATE 10 MG/ML IJ SOLN
INTRAMUSCULAR | Status: AC
  Filled 2019-12-06: qty 1

## 2019-12-06 MED ORDER — ROCURONIUM BROMIDE 10 MG/ML (PF) SYRINGE
PREFILLED_SYRINGE | INTRAVENOUS | Status: DC | PRN
  Administered 2019-12-06: 40 mg via INTRAVENOUS

## 2019-12-06 MED ORDER — IPRATROPIUM-ALBUTEROL 0.5-2.5 (3) MG/3ML IN SOLN
3.0000 mL | Freq: Once | RESPIRATORY_TRACT | Status: DC
  Filled 2019-12-06: qty 3

## 2019-12-06 MED ORDER — LIDOCAINE 2% (20 MG/ML) 5 ML SYRINGE
INTRAMUSCULAR | Status: AC
  Filled 2019-12-06: qty 5

## 2019-12-06 SURGICAL SUPPLY — 35 items
ADAPTER VALVE BIOPSY EBUS (MISCELLANEOUS) IMPLANT
ADPTR VALVE BIOPSY EBUS (MISCELLANEOUS)
BRUSH CYTOL CELLEBRITY 1.5X140 (MISCELLANEOUS) ×3 IMPLANT
CANISTER SUCT 3000ML PPV (MISCELLANEOUS) ×3 IMPLANT
CONT SPEC 4OZ CLIKSEAL STRL BL (MISCELLANEOUS) ×3 IMPLANT
COVER BACK TABLE 60X90IN (DRAPES) ×3 IMPLANT
FORCEPS BIOP RJ4 1.8 (CUTTING FORCEPS) IMPLANT
GAUZE SPONGE 4X4 12PLY STRL (GAUZE/BANDAGES/DRESSINGS) ×3 IMPLANT
GLOVE BIO SURGEON STRL SZ7.5 (GLOVE) ×3 IMPLANT
GLOVE BIOGEL PI IND STRL 6 (GLOVE) ×1 IMPLANT
GLOVE BIOGEL PI IND STRL 6.5 (GLOVE) ×1 IMPLANT
GLOVE BIOGEL PI INDICATOR 6 (GLOVE) ×2
GLOVE BIOGEL PI INDICATOR 6.5 (GLOVE) ×2
GOWN STRL REUS W/ TWL LRG LVL3 (GOWN DISPOSABLE) ×1 IMPLANT
GOWN STRL REUS W/TWL LRG LVL3 (GOWN DISPOSABLE) ×2
KIT CLEAN ENDO COMPLIANCE (KITS) ×6 IMPLANT
KIT TURNOVER KIT B (KITS) ×3 IMPLANT
MARKER SKIN DUAL TIP RULER LAB (MISCELLANEOUS) ×3 IMPLANT
NEEDLE ASPIRATION VIZISHOT 19G (NEEDLE) IMPLANT
NEEDLE ASPIRATION VIZISHOT 21G (NEEDLE) ×3 IMPLANT
NS IRRIG 1000ML POUR BTL (IV SOLUTION) ×3 IMPLANT
OIL SILICONE PENTAX (PARTS (SERVICE/REPAIRS)) ×3 IMPLANT
PAD ARMBOARD 7.5X6 YLW CONV (MISCELLANEOUS) ×6 IMPLANT
SYR 20ML ECCENTRIC (SYRINGE) ×6 IMPLANT
SYR 20ML LL LF (SYRINGE) ×6 IMPLANT
SYR 50ML SLIP (SYRINGE) ×3 IMPLANT
SYR 5ML LUER SLIP (SYRINGE) ×3 IMPLANT
TOWEL GREEN STERILE FF (TOWEL DISPOSABLE) ×3 IMPLANT
TRAP SPECIMEN MUCOUS 40CC (MISCELLANEOUS) ×3 IMPLANT
TUBE CONNECTING 20'X1/4 (TUBING) ×2
TUBE CONNECTING 20X1/4 (TUBING) ×4 IMPLANT
VALVE BIOPSY  SINGLE USE (MISCELLANEOUS) ×2
VALVE BIOPSY SINGLE USE (MISCELLANEOUS) ×1 IMPLANT
VALVE SUCTION BRONCHIO DISP (MISCELLANEOUS) ×3 IMPLANT
WATER STERILE IRR 1000ML POUR (IV SOLUTION) ×3 IMPLANT

## 2019-12-06 NOTE — Progress Notes (Signed)
patient arrived to short stay with an elevated HR.  Patient stated she is extremely anxious.  Dr. Conrad Ferndale made aware.

## 2019-12-06 NOTE — Op Note (Signed)
Video Bronchoscopy Procedure Note  Date of Operation: 12/06/2019  Pre-op Diagnosis: bronchiectasis, chronic pulmonary infection, possible RML mass  Post-op Diagnosis: Same  Surgeon: Julian Hy  Assistants: none  Anesthesia:  Per Anesthesia  Meds Given: see anesthesia records  Operation: Flexible video fiberoptic bronchoscopy and biopsies.  Estimated Blood Loss: <7QD  Complications: none noted  Indications and History: Cynthia Martinez is 59 y.o. with history of bronchiectasis and chronic cough.  Recommendation was to perform video fiberoptic bronchoscopy with BAL, possible biopsies and EBUS with possible biopsies. The risks, benefits, complications, treatment options and expected outcomes were discussed with the patient.  The possibilities of pneumothorax, pneumonia, reaction to medication, pulmonary aspiration, perforation of a viscus, bleeding, failure to diagnose a condition and creating a complication requiring transfusion or operation were discussed with the patient who freely signed the consent.    Description of Procedure: The patient was seen in the Preoperative Area, was examined and was deemed appropriate to proceed.  The patient was taken to the OR, identified as Cynthia Martinez and the procedure verified as Flexible Video Fiberoptic Bronchoscopy with BAL and possible biopsy, EBUS with possible biopsy.  A Time Out was held and the above information confirmed.   She was intubated by anesthesia with sedation per their management. The video fiberoptic bronchoscope was introduced via the ETT and a general inspection was performed which showed  Normal distal trachea, normal main carina. The R sided airways were inspected and showed normal RUL, BI, and RLL. The orifice of the RML was slightly narrowed. The L side was then inspected. The LLL, Lingular and LUL airways were normal. There were copious thick secretions throughout and friable mucosa.  BAL of the lingula and  bronchial wash was performed with collection of purulent material. EBUS was performed to evaluate the mediastinal and hilar LNs,which were all normal. The area around the RML orifice demonstrated no masses or adenopathy.  The patient tolerated the procedure well. The bronchoscope was removed. There were no obvious complications.   Samples: 1. Bronchial washings from lingula  Plans:  I will review the microbiology results with the patient when they become available.  Outpatient follow up with me as previously planned.    Julian Hy, DO 12/06/19 11:06 AM [facility]

## 2019-12-06 NOTE — Interval H&P Note (Signed)
History and Physical Interval Note:  12/06/2019 9:29 AM  Cynthia Martinez  has presented today for surgery, with the diagnosis of abnormal ct of chest, chronic cough, bronchiectasis without complication.  The various methods of treatment have been discussed with the patient and family. After consideration of risks, benefits and other options for treatment, the patient has consented to  Procedure(s): VIDEO BRONCHOSCOPY WITH ENDOBRONCHIAL ULTRASOUND (N/A), video bronchoscopy with BAL, possible endobroncial, possible EBUS-guided LN biopsy as a surgical intervention.  The patient's history has been reviewed, patient examined, no change in status, stable for surgery.  I have reviewed the patient's chart and labs.  Questions were answered to the patient's satisfaction.     Procedure reviewed in detail with the patient, including risks, benefits, alternatives.Consent form signed in the chart. NPO since last night. Has a ride home today. Not on AP or AC medications. No change in symptoms since she was last seen. Still coughing frequently.  Julian Hy, DO 12/06/19 9:30 AM [facility]

## 2019-12-06 NOTE — Anesthesia Postprocedure Evaluation (Signed)
Anesthesia Post Note  Patient: Cynthia Martinez  Procedure(s) Performed: VIDEO BRONCHOSCOPY WITH ENDOBRONCHIAL ULTRASOUND WITH BAL (N/A )     Patient location during evaluation: PACU Anesthesia Type: General Level of consciousness: awake and alert Pain management: pain level controlled Vital Signs Assessment: post-procedure vital signs reviewed and stable Respiratory status: spontaneous breathing, nonlabored ventilation, respiratory function stable and patient connected to nasal cannula oxygen Cardiovascular status: blood pressure returned to baseline and stable Postop Assessment: no apparent nausea or vomiting Anesthetic complications: no    Last Vitals:  Vitals:   12/06/19 1245 12/06/19 1300  BP:  109/81  Pulse: 97 96  Resp: 19   Temp:    SpO2: 96% 92%    Last Pain:  Vitals:   12/06/19 1245  TempSrc:   PainSc: Asleep                 Gabbrielle Mcnicholas DAVID

## 2019-12-06 NOTE — Anesthesia Procedure Notes (Signed)
Procedure Name: Intubation Date/Time: 12/06/2019 10:31 AM Performed by: Alain Marion, CRNA Pre-anesthesia Checklist: Patient identified, Emergency Drugs available, Suction available and Patient being monitored Patient Re-evaluated:Patient Re-evaluated prior to induction Oxygen Delivery Method: Circle System Utilized Preoxygenation: Pre-oxygenation with 100% oxygen Induction Type: IV induction Laryngoscope Size: Miller and 2 Grade View: Grade I Tube type: Oral Tube size: 8.5 mm Number of attempts: 1 Airway Equipment and Method: Stylet Placement Confirmation: ETT inserted through vocal cords under direct vision,  positive ETCO2 and breath sounds checked- equal and bilateral Secured at: 21 cm Tube secured with: Tape Dental Injury: Teeth and Oropharynx as per pre-operative assessment

## 2019-12-06 NOTE — Discharge Summary (Signed)
Physician Discharge Summary  Patient ID: Cynthia Martinez MRN: [id number] DOB/AGE: 61/25/1962 61 y.o.  Admit date: 12/06/2019 Discharge date: 12/06/2019  Admission Diagnoses: bronchiectasis with chronic pulmonary infection, abnormal Chest Ct  Discharge Diagnoses:  bronchiectasis with chronic pulmonary infection, abnormal Chest Ct   Discharged Condition: stable  Hospital Course: Admitted for bronchoscopy and EBUS. Tolerated both procedures well. Some post-op wheezing treated with bronchodilators.   Consults: None  Significant Diagnostic Studies: bronchoscopy: see op note  Treatments: Duonebs  Discharge Exam: Blood pressure 124/79, pulse (!) 114, temperature 98.3 F (36.8 C), resp. rate (!) 23, height 5\' 3"  (1.6 m), weight 39.6 kg, SpO2 98 %.  Sleeping comfortably but easily arousable, answering questions appropriately Wheezing resolved. Normal respirations. CTAB. abd soft, NT No edema  Disposition: Discharge disposition: 01-Home or Self Care       Discharge Instructions    Call MD for:   Complete by: As directed    Shortness of breath not responding to albuterol rescue inhaler, coughing up blood (increasing amounts or greater than 1 tablespoon in size), or fevers that are persisting in a few days.   Call MD for:  persistant nausea and vomiting   Complete by: As directed    Diet - low sodium heart healthy   Complete by: As directed    Discharge instructions   Complete by: As directed    No driving or operating heavy machinery the day of the procedure.   Increase activity slowly   Complete by: As directed      Allergies as of 12/06/2019      Reactions   Hydrochlorothiazide Hives   Hydroxyzine    Felt weird, like skin crawling   Penicillins Hives   Did it involve swelling of the face/tongue/throat, SOB, or low BP? No Did it involve sudden or severe rash/hives, skin peeling, or any reaction on the inside of your mouth or nose? No Did you need to seek medical  attention at a hospital or doctor's office? No When did it last happen?10 + years If all above answers are "NO", may proceed with cephalosporin use.   Sulfa Antibiotics Hives      Medication List    STOP taking these medications   predniSONE 20 MG tablet Commonly known as: Deltasone   vitamin B-12 1000 MCG tablet Commonly known as: CYANOCOBALAMIN     TAKE these medications   amLODipine 10 MG tablet Commonly known as: NORVASC TAKE 1 TABLET (10 MG TOTAL) BY MOUTH DAILY.   Breztri Aerosphere 160-9-4.8 MCG/ACT Aero Generic drug: Budeson-Glycopyrrol-Formoterol Inhale 1 puff into the lungs 2 (two) times daily.   cetirizine 10 MG chewable tablet Commonly known as: ZYRTEC Chew 1 tablet (10 mg total) by mouth daily.   fluticasone 50 MCG/ACT nasal spray Commonly known as: FLONASE Place 2 sprays into both nostrils daily.      Follow-up Information    Julian Hy, DO.   Specialty: Pulmonary Disease Contact information: [address] [place] [address] [phone]           Signed: Julian Hy 12/06/2019, 12:25 PM

## 2019-12-06 NOTE — Transfer of Care (Signed)
Immediate Anesthesia Transfer of Care Note  Patient: Cynthia Martinez  Procedure(s) Performed: VIDEO BRONCHOSCOPY WITH ENDOBRONCHIAL ULTRASOUND WITH BAL (N/A )  Patient Location: PACU  Anesthesia Type:General  Level of Consciousness: awake, alert  and oriented  Airway & Oxygen Therapy: Patient Spontanous Breathing and Patient connected to face mask oxygen  Post-op Assessment: Report given to RN and Post -op Vital signs reviewed and stable  Post vital signs: Reviewed and stable  Last Vitals:  Vitals Value Taken Time  BP 120/79 12/06/19 1138  Temp 36.8 C 12/06/19 1123  Pulse 109 12/06/19 1150  Resp 17 12/06/19 1150  SpO2 98 % 12/06/19 1150  Vitals shown include unvalidated device data.  Last Pain:  Vitals:   12/06/19 1123  TempSrc:   PainSc: Asleep      Patients Stated Pain Goal: 0 (98/33/82 5053)  Complications: No apparent anesthesia complications

## 2019-12-07 ENCOUNTER — Other Ambulatory Visit: Payer: Self-pay | Admitting: Critical Care Medicine

## 2019-12-07 LAB — CULTURE, RESPIRATORY W GRAM STAIN

## 2019-12-07 LAB — ACID FAST SMEAR (AFB, MYCOBACTERIA): Acid Fast Smear: NEGATIVE

## 2019-12-07 MED ORDER — CEFTIN 250 MG/5ML PO SUSR: 500.0000 mg | Freq: Two times a day (BID) | ORAL | 0 refills | Status: DC

## 2019-12-07 NOTE — Progress Notes (Signed)
Cefuroxime 500mg  BID ordered for H. Flu infection based on bronch results. E-prescribed to [facility].  Discussed with Ms. Mare Ferrari. She has a history of PCN allergy with a skin rash, but no airway swelling. She will stop the antibiotic and notify us if she develops a rash to this antibiotic.  Julian Hy, DO 12/07/19 8:12 PM [facility]

## 2019-12-08 ENCOUNTER — Telehealth: Payer: Self-pay | Admitting: Critical Care Medicine

## 2019-12-08 MED ORDER — CEFUROXIME AXETIL 250 MG PO TABS: 500.0000 mg | ORAL_TABLET | Freq: Two times a day (BID) | ORAL | 0 refills | Status: AC

## 2019-12-08 NOTE — Telephone Encounter (Signed)
Thanks!  LPC 

## 2019-12-08 NOTE — Telephone Encounter (Signed)
That is fine, thanks!  LPC 

## 2019-12-08 NOTE — Telephone Encounter (Signed)
Called and spoke to [employer]. Pts Ceftin suspension is not available nor is it covered by pts insurance. Ceftin 250mg  tabs are covered and available.   Dr. Carlis Abbott can pt be changed to the tablets? Thanks.

## 2019-12-08 NOTE — Telephone Encounter (Signed)
Called and spoke to pharmacist and changed to the tablets. Med list updated. Called and spoke to pt. Informed her of the medication change. Pt states she would like to crush the tablets and eat with apple sauce. Called pharmacy back and spoke with pharmacist. He states it is fine to crush the tablets, should not affect the absorption. ATC pt back, had to leave a VM.   Will send to Dr. Carlis Abbott as Juluis Rainier.

## 2019-12-08 NOTE — Telephone Encounter (Signed)
[facility] calling about his med not being covered and needs a new one [phone]

## 2019-12-08 NOTE — Telephone Encounter (Signed)
Pt returned call. Informed her that it was ok to crush the Ceftin tabs per the pharmacist. Pt verbalized understanding and denied any further questions or concerns at this time.

## 2019-12-10 ENCOUNTER — Ambulatory Visit
Admission: RE | Admit: 2019-12-10 | Discharge: 2019-12-10 | Disposition: A | Discharge status: Home / Self Care | Payer: Medicare Other | Source: Ambulatory Visit | Attending: Critical Care Medicine | Admitting: Critical Care Medicine

## 2019-12-10 DIAGNOSIS — B370 Candidal stomatitis: Secondary | ICD-10-CM | POA: Diagnosis not present

## 2019-12-10 DIAGNOSIS — J339 Nasal polyp, unspecified: Secondary | ICD-10-CM | POA: Diagnosis not present

## 2019-12-10 DIAGNOSIS — K8020 Calculus of gallbladder without cholecystitis without obstruction: Secondary | ICD-10-CM | POA: Diagnosis not present

## 2019-12-10 DIAGNOSIS — J329 Chronic sinusitis, unspecified: Secondary | ICD-10-CM | POA: Diagnosis not present

## 2019-12-14 ENCOUNTER — Telehealth: Payer: Self-pay | Admitting: Critical Care Medicine

## 2019-12-14 NOTE — Telephone Encounter (Signed)
Spoke with the pt  She is concerned about low hgb  She had labs done 12/01/19 and hgb was 10.7  She states she was advised to get some "over the counter vitamin or b-12 or something"- which she was not started yet  She states that she has appt with her PCP on 12/29/19- she called them yesterday and today to get their advice and never got a call back so now she is asking what Dr Carlis Abbott thinks she should do  She states that she feels cold all of the time and relates this to low iron  Dr Carlis Abbott, please advise thanks

## 2019-12-14 NOTE — Telephone Encounter (Signed)
Recommend that she follow-up with her PCP about this on February 10.  She is more than welcome to take over-the-counter iron, but her iron level should be checked by her primary care provider.  LPC

## 2019-12-15 ENCOUNTER — Other Ambulatory Visit: Payer: Self-pay | Admitting: Critical Care Medicine

## 2019-12-15 NOTE — Telephone Encounter (Signed)
Spoke with patient.  She states her PCP office called her back this morning and are going to look at previous lab work and provide treatment. Nothing further needed at this time.

## 2019-12-20 ENCOUNTER — Other Ambulatory Visit: Payer: Self-pay | Admitting: Otolaryngology

## 2019-12-20 DIAGNOSIS — J339 Nasal polyp, unspecified: Secondary | ICD-10-CM

## 2019-12-20 DIAGNOSIS — J329 Chronic sinusitis, unspecified: Secondary | ICD-10-CM

## 2019-12-23 ENCOUNTER — Other Ambulatory Visit: Payer: Self-pay

## 2019-12-23 ENCOUNTER — Ambulatory Visit
Admission: RE | Admit: 2019-12-23 | Discharge: 2019-12-23 | Disposition: A | Discharge status: Home / Self Care | Payer: Medicare Other | Source: Ambulatory Visit | Attending: Otolaryngology | Admitting: Otolaryngology

## 2019-12-23 DIAGNOSIS — J329 Chronic sinusitis, unspecified: Secondary | ICD-10-CM | POA: Diagnosis not present

## 2019-12-23 DIAGNOSIS — J339 Nasal polyp, unspecified: Secondary | ICD-10-CM

## 2019-12-27 DIAGNOSIS — J449 Chronic obstructive pulmonary disease, unspecified: Secondary | ICD-10-CM | POA: Diagnosis not present

## 2019-12-29 DIAGNOSIS — I10 Essential (primary) hypertension: Secondary | ICD-10-CM | POA: Diagnosis not present

## 2019-12-29 DIAGNOSIS — J441 Chronic obstructive pulmonary disease with (acute) exacerbation: Secondary | ICD-10-CM | POA: Diagnosis not present

## 2019-12-30 ENCOUNTER — Other Ambulatory Visit: Payer: Self-pay

## 2019-12-30 ENCOUNTER — Ambulatory Visit
Admission: RE | Admit: 2019-12-30 | Discharge: 2019-12-30 | Disposition: A | Discharge status: Home / Self Care | Payer: Medicare Other | Source: Ambulatory Visit | Attending: Family Medicine | Admitting: Family Medicine

## 2019-12-30 DIAGNOSIS — Z1231 Encounter for screening mammogram for malignant neoplasm of breast: Secondary | ICD-10-CM | POA: Diagnosis not present

## 2020-01-04 ENCOUNTER — Ambulatory Visit: Payer: Medicare Other | Admitting: Allergy and Immunology

## 2020-01-05 LAB — FUNGUS CULTURE WITH STAIN

## 2020-01-05 LAB — FUNGUS CULTURE RESULT

## 2020-01-05 LAB — FUNGAL ORGANISM REFLEX

## 2020-01-07 DIAGNOSIS — Z87891 Personal history of nicotine dependence: Secondary | ICD-10-CM | POA: Diagnosis not present

## 2020-01-07 DIAGNOSIS — J330 Polyp of nasal cavity: Secondary | ICD-10-CM | POA: Diagnosis not present

## 2020-01-07 DIAGNOSIS — J3489 Other specified disorders of nose and nasal sinuses: Secondary | ICD-10-CM | POA: Diagnosis not present

## 2020-01-07 DIAGNOSIS — J329 Chronic sinusitis, unspecified: Secondary | ICD-10-CM | POA: Diagnosis not present

## 2020-01-19 DIAGNOSIS — D649 Anemia, unspecified: Secondary | ICD-10-CM | POA: Diagnosis not present

## 2020-01-19 DIAGNOSIS — J441 Chronic obstructive pulmonary disease with (acute) exacerbation: Secondary | ICD-10-CM | POA: Diagnosis not present

## 2020-01-19 DIAGNOSIS — I10 Essential (primary) hypertension: Secondary | ICD-10-CM | POA: Diagnosis not present

## 2020-01-19 LAB — ACID FAST CULTURE WITH REFLEXED SENSITIVITIES (MYCOBACTERIA): Acid Fast Culture: NEGATIVE

## 2020-01-19 NOTE — Progress Notes (Signed)
Please let Ms. Cynthia Martinez know that her final cultures remain negative from her bronchoscopy.  Nothing additional needs to be done from that standpoint.  We will follow up as previously scheduled.

## 2020-01-24 DIAGNOSIS — J449 Chronic obstructive pulmonary disease, unspecified: Secondary | ICD-10-CM | POA: Diagnosis not present

## 2020-02-22 ENCOUNTER — Ambulatory Visit: Payer: Medicare Other | Admitting: Allergy and Immunology

## 2020-02-24 DIAGNOSIS — J449 Chronic obstructive pulmonary disease, unspecified: Secondary | ICD-10-CM | POA: Diagnosis not present

## 2020-02-29 ENCOUNTER — Ambulatory Visit: Payer: Medicare Other | Admitting: Allergy and Immunology

## 2020-03-07 ENCOUNTER — Ambulatory Visit: Payer: Medicare Other | Admitting: Allergy & Immunology

## 2020-03-07 DIAGNOSIS — B870 Cutaneous myiasis: Secondary | ICD-10-CM | POA: Diagnosis not present

## 2020-03-07 DIAGNOSIS — J441 Chronic obstructive pulmonary disease with (acute) exacerbation: Secondary | ICD-10-CM | POA: Diagnosis not present

## 2020-03-07 DIAGNOSIS — I10 Essential (primary) hypertension: Secondary | ICD-10-CM | POA: Diagnosis not present

## 2020-03-14 ENCOUNTER — Ambulatory Visit: Payer: Medicare Other | Admitting: Allergy and Immunology

## 2020-03-25 DIAGNOSIS — J449 Chronic obstructive pulmonary disease, unspecified: Secondary | ICD-10-CM | POA: Diagnosis not present

## 2020-04-07 ENCOUNTER — Telehealth: Payer: Self-pay | Admitting: Critical Care Medicine

## 2020-04-07 NOTE — Telephone Encounter (Signed)
Spoke with the pt  She wants to start using a protein powder called ENU nutrition  She states this is for healthy muscle mass and weight gain  She wanted to ask Dr Carlis Abbott if this is okay  I asked if she had checked with PCP and she states that she wanted to ask [place] first  Please advise thanks

## 2020-04-10 NOTE — Telephone Encounter (Signed)
Pt called back about this, please return call.  

## 2020-04-10 NOTE — Telephone Encounter (Signed)
LMTCB

## 2020-04-10 NOTE — Telephone Encounter (Signed)
Spoke with the pt and notified of response per Dr Carlis Abbott  She verbalized understanding

## 2020-04-10 NOTE — Telephone Encounter (Signed)
Fine with me. Supplements are not FDA certified, so it is hard to make a formal recommendation, but I think the risk is low.  LPC

## 2020-04-25 DIAGNOSIS — J449 Chronic obstructive pulmonary disease, unspecified: Secondary | ICD-10-CM | POA: Diagnosis not present

## 2020-05-25 DIAGNOSIS — J449 Chronic obstructive pulmonary disease, unspecified: Secondary | ICD-10-CM | POA: Diagnosis not present

## 2020-05-30 DIAGNOSIS — K7689 Other specified diseases of liver: Secondary | ICD-10-CM | POA: Diagnosis not present

## 2020-05-30 DIAGNOSIS — I10 Essential (primary) hypertension: Secondary | ICD-10-CM | POA: Diagnosis not present

## 2020-05-30 DIAGNOSIS — R634 Abnormal weight loss: Secondary | ICD-10-CM | POA: Diagnosis not present

## 2020-05-30 DIAGNOSIS — R769 Abnormal immunological finding in serum, unspecified: Secondary | ICD-10-CM | POA: Diagnosis not present

## 2020-05-30 DIAGNOSIS — J449 Chronic obstructive pulmonary disease, unspecified: Secondary | ICD-10-CM | POA: Diagnosis not present

## 2020-06-25 DIAGNOSIS — J449 Chronic obstructive pulmonary disease, unspecified: Secondary | ICD-10-CM | POA: Diagnosis not present

## 2020-07-17 DIAGNOSIS — Z0000 Encounter for general adult medical examination without abnormal findings: Secondary | ICD-10-CM | POA: Diagnosis not present

## 2020-08-12 ENCOUNTER — Other Ambulatory Visit: Payer: Self-pay

## 2020-08-12 ENCOUNTER — Encounter (HOSPITAL_COMMUNITY): Payer: Self-pay | Admitting: Emergency Medicine

## 2020-08-12 ENCOUNTER — Ambulatory Visit (HOSPITAL_COMMUNITY)
Admission: EM | Admit: 2020-08-12 | Discharge: 2020-08-12 | Disposition: A | Discharge status: Home / Self Care | Payer: Medicare Other | Attending: Emergency Medicine | Admitting: Emergency Medicine

## 2020-08-12 DIAGNOSIS — H60313 Diffuse otitis externa, bilateral: Secondary | ICD-10-CM | POA: Diagnosis not present

## 2020-08-12 DIAGNOSIS — H6123 Impacted cerumen, bilateral: Secondary | ICD-10-CM | POA: Diagnosis not present

## 2020-08-12 MED ORDER — NEOMYCIN-POLYMYXIN-HC 3.5-10000-1 OT SUSP: 3.0000 [drp] | Freq: Three times a day (TID) | OTIC | 0 refills | Status: DC

## 2020-08-12 NOTE — Discharge Instructions (Addendum)
Use eardrops as prescribed for the next week. Return for worsening ear pain, swelling, discharge, bleeding, decreased hearing, development of jaw pain/swelling, fever.  Do NOT use Q-tips as these can cause your ear wax to get stuck, the tips may break off and become a foreign body requiring additional medical care, or puncture your eardrum.  Helpful prevention tip: Use a solution of equal parts isopropyl (rubbing) alcohol and white vinegar (acetic acid) in both ears after swimming. 

## 2020-08-12 NOTE — ED Triage Notes (Signed)
C/O bilat ear pressure x 2 wks.  Has tried [employer], warm compresses without any relief.  Denies fevers.  Denies runny nose or congestion.

## 2020-08-12 NOTE — ED Provider Notes (Signed)
[place]    CSN: [id number] Arrival date & time: 08/12/20  1434      History   Chief Complaint Chief Complaint  Patient presents with  . Otalgia    HPI Cynthia Martinez is a 59 y.o. female   Presenting for bilateral ear discomfort (R>L) for the last 2 weeks.  Has tried Nettie pot, nasal spray, allergy medication without relief.  Endorsing muffled sensation with slightly decreased hearing in right ear.  Denies foreign body sensation or exposure, trauma, travel.  No fever, discharge or bleeding.  Past Medical History:  Diagnosis Date  . Anemia   . Anxiety   . COPD (chronic obstructive pulmonary disease) ([place])   . Depression    denies  . DUB (dysfunctional uterine bleeding)   . Family history of adverse reaction to anesthesia    ? 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 Hypertension   . Pneumonia due to Haemophilus influenzae ([place]) 09/23/2015  . Shortness of breath dyspnea     Patient Active Problem List   Diagnosis Date Noted  . Rash and nonspecific skin eruption 09/22/2017  . Dysphagia   . Depression 08/07/2015  . Cholelithiasis 08/03/2015  . Allergic rhinitis 09/17/2014  . Poor dentition 08/19/2014  . Health care maintenance 08/19/2014  . COPD, severe ([place]) 10/18/2012  . Chronic hepatitis C virus infection ([place]) 04/30/2012  . Underweight 03/19/2012  . Pernicious anemia 12/20/2011  . Hypertension 12/20/2011    Past Surgical History:  Procedure Laterality Date  . POLYPECTOMY    . VIDEO BRONCHOSCOPY WITH ENDOBRONCHIAL ULTRASOUND N/A 12/06/2019   Procedure: VIDEO BRONCHOSCOPY WITH ENDOBRONCHIAL ULTRASOUND WITH BAL;  Surgeon: Julian Hy, DO;  Location: [facility];  Service: Thoracic;  Laterality: N/A;    OB History   No obstetric history on file.      Home Medications    Prior to Admission medications   Medication Sig Start Date End Date Taking? Authorizing  Provider  Budeson-Glycopyrrol-Formoterol (BREZTRI AEROSPHERE) 160-9-4.8 MCG/ACT AERO Inhale 1 puff into the lungs 2 (two) times daily.   Yes [provider]  amLODipine (NORVASC) 10 MG tablet TAKE 1 TABLET (10 MG TOTAL) BY MOUTH DAILY. 12/17/18   Alphonzo Grieve, MD  cetirizine (ZYRTEC) 10 MG chewable tablet Chew 1 tablet (10 mg total) by mouth daily. Patient not taking: Reported on 11/25/2019 11/18/18   Wurst, [place], PA-C  fluticasone [facility]) 50 MCG/ACT nasal spray Place 2 sprays into both nostrils daily. Patient not taking: Reported on 11/25/2019 11/18/18   Wurst, [place], PA-C  neomycin-polymyxin-hydrocortisone (CORTISPORIN) 3.5-10000-1 OTIC suspension Place 3 drops into both ears 3 (three) times daily. 08/12/20   Hall-Potvin, [place], PA-C    Family History Family History  Problem Relation Age of Onset  . Diabetes Other   . Healthy Mother   . Healthy Father     Social History Social History   Tobacco Use  . Smoking status: Former Smoker    Packs/day: 0.50    Years: 9.00    Pack years: 4.50    Types: Cigarettes    Quit date: 07/19/2012    Years since quitting: 8.0  . Smokeless tobacco: Never Used  Vaping Use  . Vaping Use: Never used  Substance Use Topics  . Alcohol use: No    Alcohol/week: 0.0 standard drinks  . Drug use: No     Allergies   Hydrochlorothiazide, Hydroxyzine,  Penicillins, and Sulfa antibiotics   Review of Systems As per HPI   Physical Exam Triage Vital Signs ED Triage Vitals  Enc Vitals Group     BP      Pulse      Resp      Temp      Temp src      SpO2      Weight      Height      Head Circumference      Peak Flow      Pain Score      Pain Loc      Pain Edu?      Excl. in [place]?    No data found.  Updated Vital Signs BP 104/68 (BP Location: Right Arm)   Pulse 88   Temp 98.8 F (37.1 C) (Oral)   Resp 16   SpO2 100%   Visual Acuity Right Eye Distance:   Left Eye Distance:   Bilateral Distance:    Right Eye Near:    Left Eye Near:    Bilateral Near:     Physical Exam Constitutional:      General: She is not in acute distress. HENT:     Head: Normocephalic and atraumatic.     Right Ear: Tympanic membrane and external ear normal.     Left Ear: Tympanic membrane and external ear normal.     Ears:     Comments: Bilateral cerumen impaction. S/p bilateral irrigation: EACs with erythema, curd-like discharge. Eyes:     General: No scleral icterus.    Pupils: Pupils are equal, round, and reactive to light.  Cardiovascular:     Rate and Rhythm: Normal rate.  Pulmonary:     Effort: Pulmonary effort is normal.  Skin:    Coloration: Skin is not jaundiced or pale.  Neurological:     Mental Status: She is alert and oriented to person, place, and time.      UC Treatments / Results  Labs (all labs ordered are listed, but only abnormal results are displayed) Labs Reviewed - No data to display  EKG   Radiology No results found.  Procedures Procedures (including critical care time)  Medications Ordered in UC Medications - No data to display  Initial Impression / Assessment and Plan / UC Course  I have reviewed the triage vital signs and the nursing notes.  Pertinent labs & imaging results that were available during my care of the patient were reviewed by me and considered in my medical decision making (see chart for details).     Ears irrigated successfully in office.  Will cover for AOE as outlined below.  Return precautions discussed, pt verbalized understanding and is agreeable to plan. Final Clinical Impressions(s) / UC Diagnoses   Final diagnoses:  Acute diffuse otitis externa of both ears     Discharge Instructions     Use eardrops as prescribed for the next week. Return for worsening ear pain, swelling, discharge, bleeding, decreased hearing, development of jaw pain/swelling, fever.  Do NOT use Q-tips as these can cause your ear wax to get stuck, the tips may break off and  become a foreign body requiring additional medical care, or puncture your eardrum.  Helpful prevention tip: Use a solution of equal parts isopropyl (rubbing) alcohol and white vinegar (acetic acid) in both ears after swimming.    ED Prescriptions    Medication Sig Dispense Auth. Provider   neomycin-polymyxin-hydrocortisone (CORTISPORIN) 3.5-10000-1 OTIC suspension Place 3 drops into  both ears 3 (three) times daily. 10 mL Hall-Potvin, [place], PA-C     PDMP not reviewed this encounter.   Hall-Potvin, [place], [place] 08/12/20 [phone]

## 2020-08-17 DIAGNOSIS — I10 Essential (primary) hypertension: Secondary | ICD-10-CM | POA: Diagnosis not present

## 2020-08-17 DIAGNOSIS — D649 Anemia, unspecified: Secondary | ICD-10-CM | POA: Diagnosis not present

## 2020-08-17 DIAGNOSIS — J441 Chronic obstructive pulmonary disease with (acute) exacerbation: Secondary | ICD-10-CM | POA: Diagnosis not present

## 2020-10-02 ENCOUNTER — Observation Stay (HOSPITAL_COMMUNITY)
Admission: EM | Admit: 2020-10-02 | Discharge: 2020-10-03 | Disposition: A | Discharge status: Home / Self Care | Payer: Medicare Other | Attending: Internal Medicine | Admitting: Internal Medicine

## 2020-10-02 ENCOUNTER — Other Ambulatory Visit: Payer: Self-pay

## 2020-10-02 ENCOUNTER — Encounter (HOSPITAL_COMMUNITY): Payer: Self-pay | Admitting: Emergency Medicine

## 2020-10-02 DIAGNOSIS — D539 Nutritional anemia, unspecified: Principal | ICD-10-CM

## 2020-10-02 DIAGNOSIS — J449 Chronic obstructive pulmonary disease, unspecified: Secondary | ICD-10-CM | POA: Diagnosis not present

## 2020-10-02 DIAGNOSIS — Z7982 Long term (current) use of aspirin: Secondary | ICD-10-CM | POA: Insufficient documentation

## 2020-10-02 DIAGNOSIS — Z87891 Personal history of nicotine dependence: Secondary | ICD-10-CM | POA: Insufficient documentation

## 2020-10-02 DIAGNOSIS — E538 Deficiency of other specified B group vitamins: Secondary | ICD-10-CM | POA: Diagnosis present

## 2020-10-02 DIAGNOSIS — Z20822 Contact with and (suspected) exposure to covid-19: Secondary | ICD-10-CM | POA: Diagnosis not present

## 2020-10-02 DIAGNOSIS — R634 Abnormal weight loss: Secondary | ICD-10-CM | POA: Diagnosis not present

## 2020-10-02 DIAGNOSIS — D649 Anemia, unspecified: Secondary | ICD-10-CM

## 2020-10-02 DIAGNOSIS — I10 Essential (primary) hypertension: Secondary | ICD-10-CM | POA: Diagnosis present

## 2020-10-02 DIAGNOSIS — D510 Vitamin B12 deficiency anemia due to intrinsic factor deficiency: Secondary | ICD-10-CM | POA: Diagnosis present

## 2020-10-02 DIAGNOSIS — Z79899 Other long term (current) drug therapy: Secondary | ICD-10-CM | POA: Insufficient documentation

## 2020-10-02 DIAGNOSIS — K7689 Other specified diseases of liver: Secondary | ICD-10-CM | POA: Diagnosis not present

## 2020-10-02 DIAGNOSIS — R5383 Other fatigue: Secondary | ICD-10-CM | POA: Diagnosis present

## 2020-10-02 LAB — CBC WITH DIFFERENTIAL/PLATELET
Abs Immature Granulocytes: 0.09 10*3/uL — ABNORMAL HIGH (ref 0.00–0.07)
Basophils Absolute: 0 10*3/uL (ref 0.0–0.1)
Basophils Relative: 0 %
Eosinophils Absolute: 0 10*3/uL (ref 0.0–0.5)
Eosinophils Relative: 0 %
HCT: 16.6 % — ABNORMAL LOW (ref 36.0–46.0)
Hemoglobin: 5.1 g/dL — CL (ref 12.0–15.0)
Immature Granulocytes: 2 %
Lymphocytes Relative: 42 %
Lymphs Abs: 1.6 10*3/uL (ref 0.7–4.0)
MCH: 36.2 pg — ABNORMAL HIGH (ref 26.0–34.0)
MCHC: 30.7 g/dL (ref 30.0–36.0)
MCV: 117.7 fL — ABNORMAL HIGH (ref 80.0–100.0)
Monocytes Absolute: 0.1 10*3/uL (ref 0.1–1.0)
Monocytes Relative: 3 %
Neutro Abs: 1.9 10*3/uL (ref 1.7–7.7)
Neutrophils Relative %: 53 %
Platelets: 116 10*3/uL — ABNORMAL LOW (ref 150–400)
RBC: 1.41 MIL/uL — ABNORMAL LOW (ref 3.87–5.11)
RDW: 37.6 % — ABNORMAL HIGH (ref 11.5–15.5)
Smear Review: NORMAL
WBC: 3.7 10*3/uL — ABNORMAL LOW (ref 4.0–10.5)
nRBC: 2.2 % — ABNORMAL HIGH (ref 0.0–0.2)

## 2020-10-02 LAB — RETICULOCYTES
Immature Retic Fract: 20.4 % — ABNORMAL HIGH (ref 2.3–15.9)
RBC.: 1.43 MIL/uL — ABNORMAL LOW (ref 3.87–5.11)
Retic Count, Absolute: 41.9 10*3/uL (ref 19.0–186.0)
Retic Ct Pct: 2.9 % (ref 0.4–3.1)

## 2020-10-02 LAB — PREPARE RBC (CROSSMATCH)

## 2020-10-02 LAB — BASIC METABOLIC PANEL
Anion gap: 10 (ref 5–15)
BUN: 14 mg/dL (ref 8–23)
CO2: 24 mmol/L (ref 22–32)
Calcium: 9 mg/dL (ref 8.9–10.3)
Chloride: 104 mmol/L (ref 98–111)
Creatinine, Ser: 0.54 mg/dL (ref 0.44–1.00)
GFR, Estimated: >60 mL/min (ref >60)
Glucose, Bld: 83 mg/dL (ref 70–99)
Potassium: 3.4 mmol/L — ABNORMAL LOW (ref 3.5–5.1)
Sodium: 138 mmol/L (ref 135–145)

## 2020-10-02 LAB — RESPIRATORY PANEL BY RT PCR (FLU A&B, COVID)
Influenza A by PCR: NEGATIVE
Influenza B by PCR: NEGATIVE
SARS Coronavirus 2 by RT PCR: NEGATIVE

## 2020-10-02 LAB — FOLATE: Folate: 16.6 ng/mL (ref >5.9)

## 2020-10-02 LAB — IRON AND TIBC
Iron: 161 ug/dL (ref 28–170)
Saturation Ratios: 89 % — ABNORMAL HIGH (ref 10.4–31.8)
TIBC: 181 ug/dL — ABNORMAL LOW (ref 250–450)
UIBC: 20 ug/dL

## 2020-10-02 LAB — FERRITIN: Ferritin: 147 ng/mL (ref 11–307)

## 2020-10-02 LAB — VITAMIN B12: Vitamin B-12: <50 pg/mL — ABNORMAL LOW (ref 180–914)

## 2020-10-02 MED ORDER — CYANOCOBALAMIN 1000 MCG/ML IJ SOLN
1000.0000 ug | Freq: Once | INTRAMUSCULAR | Status: AC
  Administered 2020-10-03: 1000 ug via INTRAMUSCULAR
  Filled 2020-10-02 (×2): qty 1

## 2020-10-02 MED ORDER — POTASSIUM CHLORIDE CRYS ER 20 MEQ PO TBCR
20.0000 meq | EXTENDED_RELEASE_TABLET | Freq: Once | ORAL | Status: AC
  Administered 2020-10-02: 20 meq via ORAL
  Filled 2020-10-02: qty 1

## 2020-10-02 MED ORDER — ENOXAPARIN SODIUM 40 MG/0.4ML ~~LOC~~ SOLN
40.0000 mg | SUBCUTANEOUS | Status: DC
  Administered 2020-10-03: 40 mg via SUBCUTANEOUS
  Filled 2020-10-02: qty 0.4

## 2020-10-02 MED ORDER — CITALOPRAM HYDROBROMIDE 10 MG PO TABS
20.0000 mg | ORAL_TABLET | Freq: Every day | ORAL | Status: DC
  Administered 2020-10-03: 20 mg via ORAL
  Filled 2020-10-02: qty 2

## 2020-10-02 MED ORDER — SODIUM CHLORIDE 0.9% IV SOLUTION: Freq: Once | INTRAVENOUS | Status: AC

## 2020-10-02 MED ORDER — BUDESON-GLYCOPYRROL-FORMOTEROL 160-9-4.8 MCG/ACT IN AERO: 1.0000 | INHALATION_SPRAY | Freq: Two times a day (BID) | RESPIRATORY_TRACT | Status: DC

## 2020-10-02 MED ORDER — AMLODIPINE BESYLATE 5 MG PO TABS
10.0000 mg | ORAL_TABLET | Freq: Every day | ORAL | Status: DC
  Administered 2020-10-03: 10 mg via ORAL
  Filled 2020-10-02: qty 2

## 2020-10-02 NOTE — ED Triage Notes (Signed)
Pt. Stated, My Dr. Criss Rosales called me and told me to come here to have a blood transfusion. My Dr.  Michela Pitcher she called here 1st.

## 2020-10-02 NOTE — H&P (Signed)
History and Physical    Cynthia Martinez FEO:[id number] DOB: [birth year] DOA: 10/02/2020  PCP: Lucianne Lei, MD  Patient coming from: Home.  Chief Complaint: Low hemoglobin.  HPI: Cynthia Martinez is a 59 y.o. female with known history of B12 deficiency used to be on supplements which was discontinued about 4 to 5 years ago with history of chronic bronchiectasis and hypertension has been feeling increasingly weak and exertional shortness of breath for the last 2 to 3 weeks with no chest pain fever chills and did not notice any blood in the stools.  Given the symptoms patient went to her PCP and had blood work done which showed low hemoglobin and was advised to come to the ER.  ED Course: In the ER patient is hemodynamically stable lab work is significant for hemoglobin of 5.1 macrocytic picture with MCV of 117 potassium 3.4 B12 was less than 50.  Patient had 2 units of PRBC transfused and the vitamin B12 injection ordered.  Admitted for symptomatic anemia.  Covid test was negative.  Review of Systems: As per HPI, rest all negative.   Past Medical History:  Diagnosis Date  . Anemia   . Anxiety   . COPD (chronic obstructive pulmonary disease) ([place])   . Depression    denies  . DUB (dysfunctional uterine bleeding)   . Family history of adverse reaction to anesthesia    ? 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 Hypertension   . Pneumonia due to Haemophilus influenzae ([place]) 09/23/2015  . Shortness of breath dyspnea     Past Surgical History:  Procedure Laterality Date  . POLYPECTOMY    . VIDEO BRONCHOSCOPY WITH ENDOBRONCHIAL ULTRASOUND N/A 12/06/2019   Procedure: VIDEO BRONCHOSCOPY WITH ENDOBRONCHIAL ULTRASOUND WITH BAL;  Surgeon: Julian Hy, DO;  Location: [facility];  Service: Thoracic;  Laterality: N/A;     reports that she quit smoking about 8 years ago. Her smoking use included cigarettes. She has  a 4.50 pack-year smoking history. She has never used smokeless tobacco. She reports that she does not drink alcohol and does not use drugs.  Allergies  Allergen Reactions  . Hydrochlorothiazide Hives  . Hydroxyzine Other (See Comments)    Felt weird, like skin crawling  . Penicillins Hives    Did it involve swelling of the face/tongue/throat, SOB, or low BP? No Did it involve sudden or severe rash/hives, skin peeling, or any reaction on the inside of your mouth or nose? No Did you need to seek medical attention at a hospital or doctor's office? No When did it last happen?10 + years If all above answers are "NO", may proceed with cephalosporin use.   . Sulfa Antibiotics Hives    Family History  Problem Relation Age of Onset  . Diabetes Other   . Healthy Mother   . Healthy Father     Prior to Admission medications   Medication Sig Start Date End Date Taking? Authorizing Provider  amLODipine (NORVASC) 10 MG tablet TAKE 1 TABLET (10 MG TOTAL) BY MOUTH DAILY. 12/17/18  Yes Alphonzo Grieve, MD  aspirin-acetaminophen-caffeine (EXCEDRIN MIGRAINE) [phone] MG tablet Take 1 tablet by mouth daily as needed for headache.   Yes [provider]  Budeson-Glycopyrrol-Formoterol (BREZTRI AEROSPHERE) 160-9-4.8 MCG/ACT AERO Inhale 1 puff into the lungs 2 (two) times daily.   Yes [provider]  citalopram (CELEXA) 20 MG tablet  Take 20 mg by mouth daily.   Yes [provider]  Multiple Vitamin (MULTIVITAMIN WITH MINERALS) TABS tablet Take 1 tablet by mouth daily. Centrum Silver 50+   Yes [provider]    Physical Exam: Constitutional: Moderately built and nourished. Vitals:   10/02/20 2014 10/02/20 2130 10/02/20 2204 10/02/20 2222  BP: 127/81 121/74 (!) 145/73 (!) 133/91  Pulse: 80 83 92 90  Resp: _0 Temp:   98.2 F (36.8 C) 98.2 F (36.8 C)  TempSrc:   Oral   SpO2: 100% 100%  100%   Eyes: Anicteric mild pallor. ENMT: No discharge  from the ears eyes nose or mouth. Neck: No mass felt.  No neck rigidity. Respiratory: No rhonchi or crepitations. Cardiovascular: S1-S2 heard. Abdomen: Soft nontender bowel sounds present. Musculoskeletal: No edema. Skin: No rash. Neurologic: Alert awake oriented to time place and person.  Moves all extremities. Psychiatric: Appears normal.  Normal affect.   Labs on Admission: I have personally reviewed following labs and imaging studies  CBC: Recent Labs  Lab 10/02/20 1742  WBC 3.7*  NEUTROABS 1.9  HGB 5.1*  HCT 16.6*  MCV 117.7*  PLT 518*   Basic Metabolic Panel: Recent Labs  Lab 10/02/20 1742  NA 138  K 3.4*  CL 104  CO2 24  GLUCOSE 83  BUN 14  CREATININE 0.54  CALCIUM 9.0   GFR: CrCl cannot be calculated (Unknown ideal weight.). Liver Function Tests: No results for input(s): AST, ALT, ALKPHOS, BILITOT, PROT, ALBUMIN in the last 168 hours. No results for input(s): LIPASE, AMYLASE in the last 168 hours. No results for input(s): AMMONIA in the last 168 hours. Coagulation Profile: No results for input(s): INR, PROTIME in the last 168 hours. Cardiac Enzymes: No results for input(s): CKTOTAL, CKMB, CKMBINDEX, TROPONINI in the last 168 hours. BNP (last 3 results) No results for input(s): PROBNP in the last 8760 hours. HbA1C: No results for input(s): HGBA1C in the last 72 hours. CBG: No results for input(s): GLUCAP in the last 168 hours. Lipid Profile: No results for input(s): CHOL, HDL, LDLCALC, TRIG, CHOLHDL, LDLDIRECT in the last 72 hours. Thyroid Function Tests: No results for input(s): TSH, T4TOTAL, FREET4, T3FREE, THYROIDAB in the last 72 hours. Anemia Panel: Recent Labs    10/02/20 1742 10/02/20 2021  VITAMINB12  --  <50*  FOLATE  --  16.6  FERRITIN  --  147  TIBC  --  181*  IRON  --  161  RETICCTPCT 2.9  --    Urine analysis:    Component Value Date/Time   COLORURINE YELLOW 11/10/2015 1755   APPEARANCEUR HAZY (A) 11/10/2015 1755   LABSPEC  1.014 11/10/2015 1755   PHURINE 7.5 11/10/2015 1755   GLUCOSEU NEGATIVE 11/10/2015 1755   HGBUR NEGATIVE 11/10/2015 1755   BILIRUBINUR NEGATIVE 11/10/2015 1755   KETONESUR 40 (A) 11/10/2015 1755   PROTEINUR NEGATIVE 11/10/2015 1755   UROBILINOGEN 1.0 09/20/2015 1346   NITRITE NEGATIVE 11/10/2015 1755   LEUKOCYTESUR NEGATIVE 11/10/2015 1755   Sepsis Labs: _1 (procalcitonin:4,lacticidven:4) )No results found for this or any previous visit (from the past 240 hour(s)).   Radiological Exams on Admission: No results found.    Assessment/Plan Active Problems:   Pernicious anemia   Hypertension   COPD, severe (HCC)   Symptomatic anemia   B12 deficiency    1. Symptomatic anemia secondary to severe B12 deficiency anemia for which patient is receiving 2 units of PRBC and also B12 1000 mcg intramuscular injection.  Patient likely needs to be on B12 injection until patient CBC improves following which make consider oral B12 therapy. 2. Pancytopenia likely from B12 deficiency.  Follow CBC. 3. History of bronchiectasis on inhalers. 4. Hypertension on amlodipine. 5. History of depression on citalopram.   DVT prophylaxis: Lovenox. Code Status: Full code. Family Communication: Discussed with patient. Disposition Plan: Home. Consults called: None. Admission status: Observation.   Rise Patience MD [facility] Pager [phone].  If 7PM-7AM, please contact night-coverage [email or web address] Password [facility]  10/02/2020, 10:38 PM

## 2020-10-02 NOTE — ED Notes (Signed)
Pt given turkey sandwich and sprite.  

## 2020-10-02 NOTE — ED Provider Notes (Signed)
[facility] EMERGENCY DEPARTMENT Provider Note   CSN: [id number] Arrival date & time: 10/02/20  1720     History Chief Complaint  Patient presents with  . Abnormal Lab    Cynthia Martinez is a 59 y.o. female.  Patient presents emergency department today for anemia.  Patient has a history of pernicious anemia, intrinsic factor antibody positive in January 2013.  She has required B12 shots over the years.  Patient states that she has been having increasing fatigue, generalized weakness, and shortness of breath with exertion over the past several weeks resulting in a visit to her primary care doctor this morning.  She had labs drawn which were significant for macrocytic anemia.  Hemoglobin is 5.1 in the emergency department today.  Patient denies any active bleeding, easy bruising.  She denies black or bloody stools.  No fever or other infectious symptoms.  Patient states that she received blood transfusion while in the hospital for pneumonia in 2013.  Onset of symptoms insidious.  Course is gradually worsening.  Patient is not on a blood thinner.        Past Medical History:  Diagnosis Date  . Anemia   . Anxiety   . COPD (chronic obstructive pulmonary disease) ([place])   . Depression    denies  . DUB (dysfunctional uterine bleeding)   . Family history of adverse reaction to anesthesia    ? 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 Hypertension   . Pneumonia due to Haemophilus influenzae ([place]) 09/23/2015  . Shortness of breath dyspnea     Patient Active Problem List   Diagnosis Date Noted  . Rash and nonspecific skin eruption 09/22/2017  . Dysphagia   . Depression 08/07/2015  . Cholelithiasis 08/03/2015  . Allergic rhinitis 09/17/2014  . Poor dentition 08/19/2014  . Health care maintenance 08/19/2014  . COPD, severe ([place]) 10/18/2012  . Chronic hepatitis C virus infection ([place])  04/30/2012  . Underweight 03/19/2012  . Pernicious anemia 12/20/2011  . Hypertension 12/20/2011    Past Surgical History:  Procedure Laterality Date  . POLYPECTOMY    . VIDEO BRONCHOSCOPY WITH ENDOBRONCHIAL ULTRASOUND N/A 12/06/2019   Procedure: VIDEO BRONCHOSCOPY WITH ENDOBRONCHIAL ULTRASOUND WITH BAL;  Surgeon: Julian Hy, DO;  Location: [facility];  Service: Thoracic;  Laterality: N/A;     OB History   No obstetric history on file.     Family History  Problem Relation Age of Onset  . Diabetes Other   . Healthy Mother   . Healthy Father     Social History   Tobacco Use  . Smoking status: Former Smoker    Packs/day: 0.50    Years: 9.00    Pack years: 4.50    Types: Cigarettes    Quit date: 07/19/2012    Years since quitting: 8.2  . Smokeless tobacco: Never Used  Vaping Use  . Vaping Use: Never used  Substance Use Topics  . Alcohol use: No    Alcohol/week: 0.0 standard drinks  . Drug use: No    Home Medications Prior to Admission medications   Medication Sig Start Date End Date Taking? Authorizing Provider  amLODipine (NORVASC) 10 MG tablet TAKE 1 TABLET (10 MG TOTAL) BY MOUTH DAILY. 12/17/18  Yes Alphonzo Grieve, MD  aspirin-acetaminophen-caffeine (EXCEDRIN MIGRAINE) [phone] MG tablet Take 1 tablet by mouth daily as needed for headache.  Yes [provider]  Budeson-Glycopyrrol-Formoterol (BREZTRI AEROSPHERE) 160-9-4.8 MCG/ACT AERO Inhale 1 puff into the lungs 2 (two) times daily.   Yes [provider]  citalopram (CELEXA) 20 MG tablet Take 20 mg by mouth daily.   Yes [provider]  Multiple Vitamin (MULTIVITAMIN WITH MINERALS) TABS tablet Take 1 tablet by mouth daily. Centrum Silver 50+   Yes [provider]    Allergies    Hydrochlorothiazide, Hydroxyzine, Penicillins, and Sulfa antibiotics  Review of Systems   Review of Systems  Constitutional: Positive for fatigue. Negative for fever.  HENT: Negative for rhinorrhea  and sore throat.   Eyes: Negative for redness.  Respiratory: Positive for shortness of breath (with exertion). Negative for cough.   Cardiovascular: Negative for chest pain.  Gastrointestinal: Negative for abdominal pain, diarrhea, nausea and vomiting.  Genitourinary: Negative for dysuria, frequency, hematuria and urgency.  Musculoskeletal: Negative for myalgias.  Skin: Negative for rash.  Neurological: Positive for weakness. Negative for headaches.    Physical Exam Updated Vital Signs BP 127/81 (BP Location: Right Arm)   Pulse 80   Temp 98.3 F (36.8 C) (Oral)   Resp 16   SpO2 100%   Physical Exam Vitals and nursing note reviewed.  Constitutional:      General: She is not in acute distress.    Appearance: She is well-developed and underweight.  HENT:     Head: Normocephalic and atraumatic.     Right Ear: External ear normal.     Left Ear: External ear normal.     Nose: Nose normal.  Eyes:     Comments: Pale conjunctiva  Cardiovascular:     Rate and Rhythm: Normal rate and regular rhythm.     Heart sounds: No murmur heard.   Pulmonary:     Effort: No respiratory distress.     Breath sounds: No wheezing, rhonchi or rales.  Abdominal:     Palpations: Abdomen is soft.     Tenderness: There is no abdominal tenderness. There is no guarding or rebound.  Musculoskeletal:     Cervical back: Normal range of motion and neck supple.     Right lower leg: No edema.     Left lower leg: No edema.  Skin:    General: Skin is warm and dry.     Findings: No rash.  Neurological:     General: No focal deficit present.     Mental Status: She is alert. Mental status is at baseline.     Motor: No weakness.  Psychiatric:        Mood and Affect: Mood normal.     ED Results / Procedures / Treatments   Labs (all labs ordered are listed, but only abnormal results are displayed) Labs Reviewed  CBC WITH DIFFERENTIAL/PLATELET - Abnormal; Notable for the following components:       Result Value   WBC 3.7 (*)    RBC 1.41 (*)    Hemoglobin 5.1 (*)    HCT 16.6 (*)    MCV 117.7 (*)    MCH 36.2 (*)    RDW 37.6 (*)    Platelets 116 (*)    nRBC 2.2 (*)    Abs Immature Granulocytes 0.09 (*)    All other components within normal limits  BASIC METABOLIC PANEL - Abnormal; Notable for the following components:   Potassium 3.4 (*)    All other components within normal limits  RETICULOCYTES - Abnormal; Notable for the following components:   RBC. 1.43 (*)  Immature Retic Fract 20.4 (*)    All other components within normal limits  RESPIRATORY PANEL BY RT PCR (FLU A&B, COVID)  VITAMIN B12  FOLATE  IRON AND TIBC  FERRITIN  POC OCCULT BLOOD, ED  TYPE AND SCREEN  PREPARE RBC (CROSSMATCH)    EKG None  Radiology No results found.  Procedures Procedures (including critical care time)  Medications Ordered in ED Medications  0.9 %  sodium chloride infusion (Manually program via Guardrails IV Fluids) (has no administration in time range)    ED Course  I have reviewed the triage vital signs and the nursing notes.  Pertinent labs & imaging results that were available during my care of the patient were reviewed by me and considered in my medical decision making (see chart for details).  Patient seen and examined. Work-up initiated. Blood transfusion ordered. Patient is hemodynamically stable. Pt is agreeable to admission in the hospital.   Vital signs reviewed and are as follows: BP 127/81 (BP Location: Right Arm)   Pulse 80   Temp 98.3 F (36.8 C) (Oral)   Resp 16   SpO2 100%   9:05 PM Spoke with Dr. Hal Hope who will see.     MDM Rules/Calculators/A&P                          Admit.   Final Clinical Impression(s) / ED Diagnoses Final diagnoses:  Symptomatic anemia  Macrocytic anemia    Rx / DC Orders ED Discharge Orders    None       Carlisle Cater, Hershal Coria 10/02/20 2105    Tegeler, Gwenyth Allegra, MD 10/02/20 2322

## 2020-10-03 DIAGNOSIS — J449 Chronic obstructive pulmonary disease, unspecified: Secondary | ICD-10-CM

## 2020-10-03 DIAGNOSIS — I10 Essential (primary) hypertension: Secondary | ICD-10-CM

## 2020-10-03 DIAGNOSIS — D649 Anemia, unspecified: Secondary | ICD-10-CM

## 2020-10-03 DIAGNOSIS — E538 Deficiency of other specified B group vitamins: Secondary | ICD-10-CM

## 2020-10-03 LAB — CBC WITH DIFFERENTIAL/PLATELET
Abs Immature Granulocytes: 0.08 10*3/uL — ABNORMAL HIGH (ref 0.00–0.07)
Basophils Absolute: 0 10*3/uL (ref 0.0–0.1)
Basophils Relative: 0 %
Eosinophils Absolute: 0 10*3/uL (ref 0.0–0.5)
Eosinophils Relative: 0 %
HCT: 28.3 % — ABNORMAL LOW (ref 36.0–46.0)
Hemoglobin: 9.2 g/dL — ABNORMAL LOW (ref 12.0–15.0)
Immature Granulocytes: 2 %
Lymphocytes Relative: 35 %
Lymphs Abs: 1.7 10*3/uL (ref 0.7–4.0)
MCH: 33.3 pg (ref 26.0–34.0)
MCHC: 32.5 g/dL (ref 30.0–36.0)
MCV: 102.5 fL — ABNORMAL HIGH (ref 80.0–100.0)
Monocytes Absolute: 0.1 10*3/uL (ref 0.1–1.0)
Monocytes Relative: 2 %
Neutro Abs: 3 10*3/uL (ref 1.7–7.7)
Neutrophils Relative %: 61 %
Platelets: 68 10*3/uL — ABNORMAL LOW (ref 150–400)
RBC: 2.76 MIL/uL — ABNORMAL LOW (ref 3.87–5.11)
RDW: 28 % — ABNORMAL HIGH (ref 11.5–15.5)
WBC: 4.9 10*3/uL (ref 4.0–10.5)
nRBC: 1.4 % — ABNORMAL HIGH (ref 0.0–0.2)

## 2020-10-03 LAB — CREATININE, SERUM
Creatinine, Ser: 0.51 mg/dL (ref 0.44–1.00)
GFR, Estimated: >60 mL/min (ref >60)

## 2020-10-03 MED ORDER — FERROUS SULFATE 325 (65 FE) MG PO TABS: 325.0000 mg | ORAL_TABLET | Freq: Two times a day (BID) | ORAL | 3 refills | Status: AC

## 2020-10-03 MED ORDER — MOMETASONE FURO-FORMOTEROL FUM 100-5 MCG/ACT IN AERO
2.0000 | INHALATION_SPRAY | Freq: Two times a day (BID) | RESPIRATORY_TRACT | Status: DC
  Administered 2020-10-03: 2 via RESPIRATORY_TRACT
  Filled 2020-10-03: qty 8.8

## 2020-10-03 MED ORDER — UMECLIDINIUM BROMIDE 62.5 MCG/INH IN AEPB
1.0000 | INHALATION_SPRAY | Freq: Every day | RESPIRATORY_TRACT | Status: DC
  Administered 2020-10-03: 10:00:00 1 via RESPIRATORY_TRACT
  Filled 2020-10-03: qty 7

## 2020-10-03 MED ORDER — FERROUS SULFATE 325 (65 FE) MG PO TABS: 325.0000 mg | ORAL_TABLET | Freq: Two times a day (BID) | ORAL | Status: DC

## 2020-10-03 MED ORDER — PANTOPRAZOLE SODIUM 40 MG PO TBEC: 40.0000 mg | DELAYED_RELEASE_TABLET | Freq: Every day | ORAL | Status: DC

## 2020-10-03 NOTE — ED Notes (Signed)
Admitting MD at bedside.

## 2020-10-03 NOTE — Discharge Summary (Addendum)
Physician Discharge Summary  Cynthia Martinez PZW:[id number] DOB: 01-Apr-1961 DOA: 10/02/2020  PCP: Lucianne Lei, MD  Admit date: 10/02/2020 Discharge date: 10/03/2020  Admitted From: Home Disposition:  Home  Recommendations for Outpatient Follow-up:  1. Follow up with PCP in 1-2 weeks 2. Please obtain BMP/CBC in one week 3. Please follow up on the following pending results:  Home Health: No Equipment/Devices: None  Discharge Condition: Stable CODE STATUS: Full Diet recommendation: Heart Healthy  Brief/Interim Summary:  HPI per Dr. Hal Hope: Cynthia Martinez is a 59 y.o. female with known history of B12 deficiency used to be on supplements which was discontinued about 4 to 5 years ago with history of chronic bronchiectasis and hypertension has been feeling increasingly weak and exertional shortness of breath for the last 2 to 3 weeks with no chest pain fever chills and did not notice any blood in the stools.  Given the symptoms patient went to her PCP and had blood work done which showed low hemoglobin and was advised to come to the ER.  ED Course: In the ER patient is hemodynamically stable lab work is significant for hemoglobin of 5.1 macrocytic picture with MCV of 117 potassium 3.4 B12 was less than 50.  Patient had 2 units of PRBC transfused and the vitamin B12 injection ordered.  Admitted for symptomatic anemia.  Covid test was negative.  Hospital Course: Patient received 2 units of pRBC and one injection of B12.     10/02/2020  10/03/2020   RBC 1.41 (L) 2.76 (L)  Hemoglobin 5.1 (LL) 9.2 (L)  HCT 16.6 (L) 28.3 (L)  MCV 117.7 (H) 102.5 (H)   Symptomatic Anemia: - Hemoglobin improved from 5.1 to 9.2 g/dL after two units of pRBC.   - Referral was placed to follow up with Heme/Onc outpatient.  B12 Deficiency - diet vs other: B12 levels <50 pg/mL and Folate levels were normal 16.6 ng/mL. - Counseled to get B12 injections outpatient. - Check IF antibodies and Celiac panel  to complete workup. - Counseled on diet and patient's poor oral intake.  Can follow up with Nutritionist outpatient.  History of Iron Deficiency Anemia - Ok to take Ferrous Sulfate 325 mg BID.  Essential Hypertension: BP stable. COPD, not in acute exacerbation: Lung exam normal. Major Depressive Disorder: Recently started on Citalopram. - Patient will follow up with her regular PCP to manage her chronic conditions.   Discharge Diagnoses:  Active Problems:   Pernicious anemia   Hypertension   COPD, severe ([place])   Symptomatic anemia   B12 deficiency    Discharge Instructions  Discharge Instructions    Ambulatory referral to Hematology   Complete by: As directed    Diet - low sodium heart healthy   Complete by: As directed    Increase activity slowly   Complete by: As directed      Allergies as of 10/03/2020      Reactions   Hydrochlorothiazide Hives   Hydroxyzine Other (See Comments)   Felt weird, like skin crawling   Penicillins Hives   Did it involve swelling of the face/tongue/throat, SOB, or low BP? No Did it involve sudden or severe rash/hives, skin peeling, or any reaction on the inside of your mouth or nose? No Did you need to seek medical attention at a hospital or doctor's office? No When did it last happen?10 + years If all above answers are "NO", may proceed with cephalosporin use.   Sulfa Antibiotics Hives      Medication List  TAKE these medications   amLODipine 10 MG tablet Commonly known as: NORVASC TAKE 1 TABLET (10 MG TOTAL) BY MOUTH DAILY.   aspirin-acetaminophen-caffeine 250-250-65 MG tablet Commonly known as: EXCEDRIN MIGRAINE Take 1 tablet by mouth daily as needed for headache.   Breztri Aerosphere 160-9-4.8 MCG/ACT Aero Generic drug: Budeson-Glycopyrrol-Formoterol Inhale 1 puff into the lungs 2 (two) times daily.   citalopram 20 MG tablet Commonly known as: CELEXA Take 20 mg by mouth daily.   ferrous sulfate 325 (65 FE) MG  tablet Take 1 tablet (325 mg total) by mouth 2 (two) times daily with a meal.   multivitamin with minerals Tabs tablet Take 1 tablet by mouth daily. Centrum Silver 50+       Allergies  Allergen Reactions  . Hydrochlorothiazide Hives  . Hydroxyzine Other (See Comments)    Felt weird, like skin crawling  . Penicillins Hives    Did it involve swelling of the face/tongue/throat, SOB, or low BP? No Did it involve sudden or severe rash/hives, skin peeling, or any reaction on the inside of your mouth or nose? No Did you need to seek medical attention at a hospital or doctor's office? No When did it last happen?10 + years If all above answers are "NO", may proceed with cephalosporin use.   . Sulfa Antibiotics Hives    Consultations:  Heme/Onc   Procedures/Studies:  No results found.  Subjective: Patient is stable on day of discharge.  All symptoms have resolved.  HH is stable.  Discharge Exam: Vitals:   10/03/20 1012 10/03/20 1526  BP: 127/82 129/72  Pulse: 72 77  Resp: 16 17  Temp: 98.1 F (36.7 C) 98.3 F (36.8 C)  SpO2: 100% 97%   Vitals:   10/03/20 0600 10/03/20 0800 10/03/20 1012 10/03/20 1526  BP: 129/78 (!) 135/97 127/82 129/72  Pulse: 71 67 72 77  Resp: 17 20 16 17   Temp:   98.1 F (36.7 C) 98.3 F (36.8 C)  TempSrc:   Oral Oral  SpO2: 100% 100% 100% 97%  Weight:      Height:        General: Pt is alert, awake, not in acute distress Cardiovascular: RRR, S1/S2 +, no rubs, no gallops Respiratory: CTA bilaterally, no wheezing, no rhonchi Abdominal: Soft, NT, ND, bowel sounds + Extremities: no edema, no cyanosis    The results of significant diagnostics from this hospitalization (including imaging, microbiology, ancillary and laboratory) are listed below for reference.     Microbiology: Recent Results (from the past 240 hour(s))  Respiratory Panel by RT PCR (Flu A&B, Covid) - Nasopharyngeal Swab     Status: None   Collection Time: 10/02/20   8:45 PM   Specimen: Nasopharyngeal Swab  Result Value Ref Range Status   SARS Coronavirus 2 by RT PCR NEGATIVE NEGATIVE Final    Comment: (NOTE) SARS-CoV-2 target nucleic acids are NOT DETECTED.  The SARS-CoV-2 RNA is generally detectable in upper respiratoy specimens during the acute phase of infection. The lowest concentration of SARS-CoV-2 viral copies this assay can detect is 131 copies/mL. A negative result does not preclude SARS-Cov-2 infection and should not be used as the sole basis for treatment or other patient management decisions. A negative result may occur with  improper specimen collection/handling, submission of specimen other than nasopharyngeal swab, presence of viral mutation(s) within the areas targeted by this assay, and inadequate number of viral copies (<131 copies/mL). A negative result must be combined with clinical observations, patient history, and epidemiological information.  The expected result is Negative.  Fact Sheet for Patients:  PinkCheek.be  Fact Sheet for Healthcare Providers:  GravelBags.it  This test is no t yet approved or cleared by the [place] FDA and  has been authorized for detection and/or diagnosis of SARS-CoV-2 by FDA under an Emergency Use Authorization (EUA). This EUA will remain  in effect (meaning this test can be used) for the duration of the COVID-19 declaration under Section 564(b)(1) of the Act, 21 U.S.C. section 360bbb-3(b)(1), unless the authorization is terminated or revoked sooner.     Influenza A by PCR NEGATIVE NEGATIVE Final   Influenza B by PCR NEGATIVE NEGATIVE Final    Comment: (NOTE) The Xpert Xpress SARS-CoV-2/FLU/RSV assay is intended as an aid in  the diagnosis of influenza from Nasopharyngeal swab specimens and  should not be used as a sole basis for treatment. Nasal washings and  aspirates are unacceptable for Xpert Xpress SARS-CoV-2/FLU/RSV   testing.  Fact Sheet for Patients: PinkCheek.be  Fact Sheet for Healthcare Providers: GravelBags.it  This test is not yet approved or cleared by the [place] FDA and  has been authorized for detection and/or diagnosis of SARS-CoV-2 by  FDA under an Emergency Use Authorization (EUA). This EUA will remain  in effect (meaning this test can be used) for the duration of the  Covid-19 declaration under Section 564(b)(1) of the Act, 21  U.S.C. section 360bbb-3(b)(1), unless the authorization is  terminated or revoked. Performed at [facility], [place] [address] [place], [place] [address]      Labs: BNP (last 3 results) No results for input(s): BNP in the last 8760 hours. Basic Metabolic Panel: Recent Labs  Lab 10/02/20 1742 10/03/20 1328  NA 138  --   K 3.4*  --   CL 104  --   CO2 24  --   GLUCOSE 83  --   BUN 14  --   CREATININE 0.54 0.51  CALCIUM 9.0  --    Liver Function Tests: No results for input(s): AST, ALT, ALKPHOS, BILITOT, PROT, ALBUMIN in the last 168 hours. No results for input(s): LIPASE, AMYLASE in the last 168 hours. No results for input(s): AMMONIA in the last 168 hours. CBC: Recent Labs  Lab 10/02/20 1742 10/03/20 1328  WBC 3.7* 4.9  NEUTROABS 1.9 3.0  HGB 5.1* 9.2*  HCT 16.6* 28.3*  MCV 117.7* 102.5*  PLT 116* 68*   Cardiac Enzymes: No results for input(s): CKTOTAL, CKMB, CKMBINDEX, TROPONINI in the last 168 hours. BNP: Invalid input(s): POCBNP CBG: No results for input(s): GLUCAP in the last 168 hours. D-Dimer No results for input(s): DDIMER in the last 72 hours. Hgb A1c No results for input(s): HGBA1C in the last 72 hours. Lipid Profile No results for input(s): CHOL, HDL, LDLCALC, TRIG, CHOLHDL, LDLDIRECT in the last 72 hours. Thyroid function studies No results for input(s): TSH, T4TOTAL, T3FREE, THYROIDAB in the last 72 hours.  Invalid input(s): FREET3 Anemia work  up Recent Labs    10/02/20 1742 10/02/20 2021  VITAMINB12  --  <50*  FOLATE  --  16.6  FERRITIN  --  147  TIBC  --  181*  IRON  --  161  RETICCTPCT 2.9  --    Urinalysis    Component Value Date/Time   COLORURINE YELLOW 11/10/2015 1755   APPEARANCEUR HAZY (A) 11/10/2015 1755   LABSPEC 1.014 11/10/2015 1755   PHURINE 7.5 11/10/2015 1755   GLUCOSEU NEGATIVE 11/10/2015 1755   HGBUR NEGATIVE 11/10/2015  [place] 11/10/2015 1755   KETONESUR 40 (A) 11/10/2015 1755   PROTEINUR NEGATIVE 11/10/2015 1755   UROBILINOGEN 1.0 09/20/2015 1346   NITRITE NEGATIVE 11/10/2015 1755   LEUKOCYTESUR NEGATIVE 11/10/2015 1755   Sepsis Labs Invalid input(s): PROCALCITONIN,  WBC,  LACTICIDVEN Microbiology Recent Results (from the past 240 hour(s))  Respiratory Panel by RT PCR (Flu A&B, Covid) - Nasopharyngeal Swab     Status: None   Collection Time: 10/02/20  8:45 PM   Specimen: Nasopharyngeal Swab  Result Value Ref Range Status   SARS Coronavirus 2 by RT PCR NEGATIVE NEGATIVE Final    Comment: (NOTE) SARS-CoV-2 target nucleic acids are NOT DETECTED.  The SARS-CoV-2 RNA is generally detectable in upper respiratoy specimens during the acute phase of infection. The lowest concentration of SARS-CoV-2 viral copies this assay can detect is 131 copies/mL. A negative result does not preclude SARS-Cov-2 infection and should not be used as the sole basis for treatment or other patient management decisions. A negative result may occur with  improper specimen collection/handling, submission of specimen other than nasopharyngeal swab, presence of viral mutation(s) within the areas targeted by this assay, and inadequate number of viral copies (<131 copies/mL). A negative result must be combined with clinical observations, patient history, and epidemiological information. The expected result is Negative.  Fact Sheet for Patients:  PinkCheek.be  Fact Sheet  for Healthcare Providers:  GravelBags.it  This test is no t yet approved or cleared by the [place] FDA and  has been authorized for detection and/or diagnosis of SARS-CoV-2 by FDA under an Emergency Use Authorization (EUA). This EUA will remain  in effect (meaning this test can be used) for the duration of the COVID-19 declaration under Section 564(b)(1) of the Act, 21 U.S.C. section 360bbb-3(b)(1), unless the authorization is terminated or revoked sooner.     Influenza A by PCR NEGATIVE NEGATIVE Final   Influenza B by PCR NEGATIVE NEGATIVE Final    Comment: (NOTE) The Xpert Xpress SARS-CoV-2/FLU/RSV assay is intended as an aid in  the diagnosis of influenza from Nasopharyngeal swab specimens and  should not be used as a sole basis for treatment. Nasal washings and  aspirates are unacceptable for Xpert Xpress SARS-CoV-2/FLU/RSV  testing.  Fact Sheet for Patients: PinkCheek.be  Fact Sheet for Healthcare Providers: GravelBags.it  This test is not yet approved or cleared by the [place] FDA and  has been authorized for detection and/or diagnosis of SARS-CoV-2 by  FDA under an Emergency Use Authorization (EUA). This EUA will remain  in effect (meaning this test can be used) for the duration of the  Covid-19 declaration under Section 564(b)(1) of the Act, 21  U.S.C. section 360bbb-3(b)(1), unless the authorization is  terminated or revoked. Performed at [facility], [place] [address], [place], [place] [address]      Time coordinating discharge: Over 30 minutes  SIGNED:   George Hugh, MD  [facility] 10/03/2020, 11:24 PM Pager   If 7PM-7AM, please contact night-coverage [email or web address] Password [email or web address]

## 2020-10-03 NOTE — ED Notes (Signed)
Per Lavera Guise, MD she has spoken with pt and informed this RN that pt can be discharged.

## 2020-10-03 NOTE — ED Notes (Signed)
Pt has not gotten labs drawn due to blood products hanging

## 2020-10-03 NOTE — ED Notes (Signed)
Pt ambulated to and from restroom. 

## 2020-10-03 NOTE — ED Notes (Signed)
Pt voiced being frustrated and wanting to leave. This RN notified MD to come speak with pt and provided an update.

## 2020-10-04 LAB — TYPE AND SCREEN
ABO/RH(D): O POS
Antibody Screen: NEGATIVE
Unit division: 0
Unit division: 0

## 2020-10-04 LAB — BPAM RBC
Blood Product Expiration Date: 202112202359
Blood Product Expiration Date: 202112202359
ISSUE DATE / TIME: 202111152146
ISSUE DATE / TIME: 202111160042
Unit Type and Rh: 5100
Unit Type and Rh: 5100

## 2020-10-04 LAB — HIV ANTIBODY (ROUTINE TESTING W REFLEX): HIV Screen 4th Generation wRfx: NONREACTIVE

## 2020-10-04 LAB — GLIADIN ANTIBODIES, SERUM
Antigliadin Abs, IgA: 9 units (ref 0–19)
Gliadin IgG: 4 units (ref 0–19)

## 2020-10-04 LAB — PATHOLOGIST SMEAR REVIEW

## 2020-10-04 LAB — INTRINSIC FACTOR ANTIBODIES: Intrinsic Factor: 64.4 AU/mL — ABNORMAL HIGH (ref 0.0–1.1)

## 2020-10-04 LAB — TISSUE TRANSGLUTAMINASE, IGA: Tissue Transglutaminase Ab, IgA: <2 U/mL (ref 0–3)

## 2020-10-05 ENCOUNTER — Telehealth: Payer: Self-pay | Admitting: *Deleted

## 2020-10-05 NOTE — Telephone Encounter (Signed)
Pt called regarding [facility] system down and not able to fill her Rx.  Pt asked RNCM to call Rx in to [facility] on [address].  RNCM called in Rx as prescribed.

## 2020-10-06 LAB — RPR: RPR Ser Ql: NONREACTIVE

## 2020-10-11 LAB — RETICULIN ANTIBODIES, IGA W TITER: Reticulin Ab, IgA: NEGATIVE titer (ref <2.5)

## 2020-10-17 DIAGNOSIS — J441 Chronic obstructive pulmonary disease with (acute) exacerbation: Secondary | ICD-10-CM | POA: Diagnosis not present

## 2020-10-17 DIAGNOSIS — I10 Essential (primary) hypertension: Secondary | ICD-10-CM | POA: Diagnosis not present

## 2020-10-17 DIAGNOSIS — D649 Anemia, unspecified: Secondary | ICD-10-CM | POA: Diagnosis not present

## 2020-11-22 ENCOUNTER — Other Ambulatory Visit: Payer: Self-pay | Admitting: Internal Medicine

## 2020-12-01 ENCOUNTER — Other Ambulatory Visit: Payer: Self-pay | Admitting: Family Medicine

## 2020-12-01 DIAGNOSIS — Z1231 Encounter for screening mammogram for malignant neoplasm of breast: Secondary | ICD-10-CM

## 2020-12-05 DIAGNOSIS — J449 Chronic obstructive pulmonary disease, unspecified: Secondary | ICD-10-CM | POA: Diagnosis not present

## 2020-12-05 DIAGNOSIS — K7689 Other specified diseases of liver: Secondary | ICD-10-CM | POA: Diagnosis not present

## 2020-12-05 DIAGNOSIS — D649 Anemia, unspecified: Secondary | ICD-10-CM | POA: Diagnosis not present

## 2020-12-05 DIAGNOSIS — D510 Vitamin B12 deficiency anemia due to intrinsic factor deficiency: Secondary | ICD-10-CM | POA: Diagnosis not present

## 2020-12-05 DIAGNOSIS — I10 Essential (primary) hypertension: Secondary | ICD-10-CM | POA: Diagnosis not present

## 2020-12-15 DIAGNOSIS — D510 Vitamin B12 deficiency anemia due to intrinsic factor deficiency: Secondary | ICD-10-CM | POA: Diagnosis not present

## 2020-12-15 DIAGNOSIS — D649 Anemia, unspecified: Secondary | ICD-10-CM | POA: Diagnosis not present

## 2021-01-15 ENCOUNTER — Ambulatory Visit: Payer: Medicare Other

## 2021-01-15 DIAGNOSIS — D649 Anemia, unspecified: Secondary | ICD-10-CM | POA: Diagnosis not present

## 2021-01-15 DIAGNOSIS — I10 Essential (primary) hypertension: Secondary | ICD-10-CM | POA: Diagnosis not present

## 2021-01-15 DIAGNOSIS — J441 Chronic obstructive pulmonary disease with (acute) exacerbation: Secondary | ICD-10-CM | POA: Diagnosis not present

## 2021-01-19 ENCOUNTER — Other Ambulatory Visit: Payer: Self-pay

## 2021-01-19 ENCOUNTER — Ambulatory Visit
Admission: RE | Admit: 2021-01-19 | Discharge: 2021-01-19 | Disposition: A | Discharge status: Home / Self Care | Payer: Medicare Other | Source: Ambulatory Visit | Attending: Family Medicine | Admitting: Family Medicine

## 2021-01-19 DIAGNOSIS — L308 Other specified dermatitis: Secondary | ICD-10-CM | POA: Diagnosis not present

## 2021-01-19 DIAGNOSIS — Z1231 Encounter for screening mammogram for malignant neoplasm of breast: Secondary | ICD-10-CM | POA: Diagnosis not present

## 2021-01-19 DIAGNOSIS — L818 Other specified disorders of pigmentation: Secondary | ICD-10-CM | POA: Diagnosis not present

## 2021-02-13 DIAGNOSIS — I10 Essential (primary) hypertension: Secondary | ICD-10-CM | POA: Diagnosis not present

## 2021-02-13 DIAGNOSIS — J449 Chronic obstructive pulmonary disease, unspecified: Secondary | ICD-10-CM | POA: Diagnosis not present

## 2021-02-26 DIAGNOSIS — H524 Presbyopia: Secondary | ICD-10-CM | POA: Diagnosis not present

## 2021-02-26 DIAGNOSIS — H5203 Hypermetropia, bilateral: Secondary | ICD-10-CM | POA: Diagnosis not present

## 2021-05-10 DIAGNOSIS — R21 Rash and other nonspecific skin eruption: Secondary | ICD-10-CM | POA: Diagnosis not present

## 2021-05-10 DIAGNOSIS — L989 Disorder of the skin and subcutaneous tissue, unspecified: Secondary | ICD-10-CM | POA: Diagnosis not present

## 2021-05-10 DIAGNOSIS — D485 Neoplasm of uncertain behavior of skin: Secondary | ICD-10-CM | POA: Diagnosis not present

## 2021-05-10 DIAGNOSIS — L308 Other specified dermatitis: Secondary | ICD-10-CM | POA: Diagnosis not present

## 2021-05-10 DIAGNOSIS — L281 Prurigo nodularis: Secondary | ICD-10-CM | POA: Diagnosis not present

## 2021-05-17 DIAGNOSIS — J441 Chronic obstructive pulmonary disease with (acute) exacerbation: Secondary | ICD-10-CM | POA: Diagnosis not present

## 2021-05-17 DIAGNOSIS — I10 Essential (primary) hypertension: Secondary | ICD-10-CM | POA: Diagnosis not present

## 2021-05-17 DIAGNOSIS — D649 Anemia, unspecified: Secondary | ICD-10-CM | POA: Diagnosis not present

## 2021-05-25 DIAGNOSIS — L280 Lichen simplex chronicus: Secondary | ICD-10-CM | POA: Diagnosis not present

## 2021-05-25 DIAGNOSIS — L738 Other specified follicular disorders: Secondary | ICD-10-CM | POA: Diagnosis not present

## 2021-05-25 DIAGNOSIS — L816 Other disorders of diminished melanin formation: Secondary | ICD-10-CM | POA: Diagnosis not present

## 2021-06-15 DIAGNOSIS — J449 Chronic obstructive pulmonary disease, unspecified: Secondary | ICD-10-CM | POA: Diagnosis not present

## 2021-06-15 DIAGNOSIS — I10 Essential (primary) hypertension: Secondary | ICD-10-CM | POA: Diagnosis not present

## 2021-06-15 DIAGNOSIS — D649 Anemia, unspecified: Secondary | ICD-10-CM | POA: Diagnosis not present

## 2021-06-15 DIAGNOSIS — E559 Vitamin D deficiency, unspecified: Secondary | ICD-10-CM | POA: Diagnosis not present

## 2021-06-15 DIAGNOSIS — D510 Vitamin B12 deficiency anemia due to intrinsic factor deficiency: Secondary | ICD-10-CM | POA: Diagnosis not present

## 2021-07-11 DIAGNOSIS — L280 Lichen simplex chronicus: Secondary | ICD-10-CM | POA: Diagnosis not present

## 2021-07-11 DIAGNOSIS — L810 Postinflammatory hyperpigmentation: Secondary | ICD-10-CM | POA: Diagnosis not present

## 2021-07-18 DIAGNOSIS — D649 Anemia, unspecified: Secondary | ICD-10-CM | POA: Diagnosis not present

## 2021-07-18 DIAGNOSIS — J441 Chronic obstructive pulmonary disease with (acute) exacerbation: Secondary | ICD-10-CM | POA: Diagnosis not present

## 2021-07-18 DIAGNOSIS — I10 Essential (primary) hypertension: Secondary | ICD-10-CM | POA: Diagnosis not present

## 2021-08-17 DIAGNOSIS — D649 Anemia, unspecified: Secondary | ICD-10-CM | POA: Diagnosis not present

## 2021-08-17 DIAGNOSIS — I10 Essential (primary) hypertension: Secondary | ICD-10-CM | POA: Diagnosis not present

## 2021-08-17 DIAGNOSIS — J441 Chronic obstructive pulmonary disease with (acute) exacerbation: Secondary | ICD-10-CM | POA: Diagnosis not present

## 2021-09-17 DIAGNOSIS — J441 Chronic obstructive pulmonary disease with (acute) exacerbation: Secondary | ICD-10-CM | POA: Diagnosis not present

## 2021-09-17 DIAGNOSIS — D649 Anemia, unspecified: Secondary | ICD-10-CM | POA: Diagnosis not present

## 2021-09-17 DIAGNOSIS — I10 Essential (primary) hypertension: Secondary | ICD-10-CM | POA: Diagnosis not present

## 2021-10-02 DIAGNOSIS — I10 Essential (primary) hypertension: Secondary | ICD-10-CM | POA: Diagnosis not present

## 2021-10-02 DIAGNOSIS — R634 Abnormal weight loss: Secondary | ICD-10-CM | POA: Diagnosis not present

## 2021-12-21 ENCOUNTER — Other Ambulatory Visit: Payer: Self-pay | Admitting: Family Medicine

## 2021-12-21 DIAGNOSIS — Z1231 Encounter for screening mammogram for malignant neoplasm of breast: Secondary | ICD-10-CM

## 2022-01-14 DIAGNOSIS — I10 Essential (primary) hypertension: Secondary | ICD-10-CM | POA: Diagnosis not present

## 2022-01-14 DIAGNOSIS — J449 Chronic obstructive pulmonary disease, unspecified: Secondary | ICD-10-CM | POA: Diagnosis not present

## 2022-01-14 DIAGNOSIS — Z681 Body mass index (BMI) 19 or less, adult: Secondary | ICD-10-CM | POA: Diagnosis not present

## 2022-01-16 DIAGNOSIS — J449 Chronic obstructive pulmonary disease, unspecified: Secondary | ICD-10-CM | POA: Diagnosis not present

## 2022-01-16 DIAGNOSIS — I10 Essential (primary) hypertension: Secondary | ICD-10-CM | POA: Diagnosis not present

## 2022-01-16 DIAGNOSIS — E782 Mixed hyperlipidemia: Secondary | ICD-10-CM | POA: Diagnosis not present

## 2022-01-16 DIAGNOSIS — Z681 Body mass index (BMI) 19 or less, adult: Secondary | ICD-10-CM | POA: Diagnosis not present

## 2022-01-21 ENCOUNTER — Ambulatory Visit: Payer: Medicare Other

## 2022-01-23 ENCOUNTER — Ambulatory Visit: Payer: Medicare Other

## 2022-01-24 ENCOUNTER — Ambulatory Visit: Payer: Medicare Other

## 2022-01-30 ENCOUNTER — Ambulatory Visit
Admission: RE | Admit: 2022-01-30 | Discharge: 2022-01-30 | Disposition: A | Discharge status: Home / Self Care | Payer: Medicare Other | Source: Ambulatory Visit | Attending: Family Medicine | Admitting: Family Medicine

## 2022-01-30 ENCOUNTER — Other Ambulatory Visit: Payer: Self-pay

## 2022-01-30 DIAGNOSIS — Z1231 Encounter for screening mammogram for malignant neoplasm of breast: Secondary | ICD-10-CM

## 2022-02-20 DIAGNOSIS — L2089 Other atopic dermatitis: Secondary | ICD-10-CM | POA: Diagnosis not present

## 2022-02-20 DIAGNOSIS — L281 Prurigo nodularis: Secondary | ICD-10-CM | POA: Diagnosis not present

## 2022-03-12 DIAGNOSIS — L2089 Other atopic dermatitis: Secondary | ICD-10-CM | POA: Diagnosis not present

## 2022-03-28 DIAGNOSIS — L2089 Other atopic dermatitis: Secondary | ICD-10-CM | POA: Diagnosis not present

## 2022-04-11 DIAGNOSIS — L2089 Other atopic dermatitis: Secondary | ICD-10-CM | POA: Diagnosis not present

## 2022-08-15 DIAGNOSIS — Z681 Body mass index (BMI) 19 or less, adult: Secondary | ICD-10-CM | POA: Diagnosis not present

## 2022-08-15 DIAGNOSIS — Z0000 Encounter for general adult medical examination without abnormal findings: Secondary | ICD-10-CM | POA: Diagnosis not present

## 2022-08-15 DIAGNOSIS — I10 Essential (primary) hypertension: Secondary | ICD-10-CM | POA: Diagnosis not present

## 2022-08-15 DIAGNOSIS — R634 Abnormal weight loss: Secondary | ICD-10-CM | POA: Diagnosis not present

## 2022-08-15 DIAGNOSIS — J449 Chronic obstructive pulmonary disease, unspecified: Secondary | ICD-10-CM | POA: Diagnosis not present

## 2022-10-24 ENCOUNTER — Encounter: Payer: Self-pay | Admitting: Pulmonary Disease

## 2022-10-24 ENCOUNTER — Ambulatory Visit (INDEPENDENT_AMBULATORY_CARE_PROVIDER_SITE_OTHER): Payer: Medicare Other | Admitting: Pulmonary Disease

## 2022-10-24 VITALS — BP 128/80 | HR 122 | Temp 98.1°F | Ht 63.0 in | Wt 81.4 lb

## 2022-10-24 DIAGNOSIS — R634 Abnormal weight loss: Secondary | ICD-10-CM

## 2022-10-24 DIAGNOSIS — J449 Chronic obstructive pulmonary disease, unspecified: Secondary | ICD-10-CM

## 2022-10-24 DIAGNOSIS — J471 Bronchiectasis with (acute) exacerbation: Secondary | ICD-10-CM

## 2022-10-24 DIAGNOSIS — R911 Solitary pulmonary nodule: Secondary | ICD-10-CM | POA: Diagnosis not present

## 2022-10-24 MED ORDER — AREXVY 120 MCG/0.5ML IM SUSR: 0.5000 mL | Freq: Once | INTRAMUSCULAR | 0 refills | Status: AC

## 2022-10-24 NOTE — Patient Instructions (Signed)
Bronchiectasis with worsening weight loss: The key to bronchiectasis management is taking a balanced approach to your health: exercise, nutrition, mucociliary clearance, and attention to airway infection.  Exercise: Stay physically active: exercise multiple days per week with endurance and resistance activities  Nutrition: Drink at least 64oz of water a day unless directed otherwise by a physician Your diet should be well balanced with a heavy focus on unprocessed fruits and vegetables Make it a goal to eat 1.2g/kg body weight of lean protein a day.  For example, if you weigh 50 kg then try to eat 60 grams of fish, egg white protein, lentils in a day  Mucociliary clearance: This term refers to the physical effort you make routinely to help clear mucus out of your lungs Use a flutter valve 8-10 breaths, 2 times per day As a reminder, most patients with bronchiectasis produce mucus every day  Attention to airway infection: Please provide [place] with a sample of your mucus: We will test for bacterial, fungal, AFB culture If you experience increasing chest congestion, mucus production, change in mucus color, shortness of breath, or fever I need to know right away  We will check a high-resolution CT scan of your chest  Weight loss: See above  COPD: Continue Breztri 2 puffs twice a day no matter how you feel Continue albuterol as needed for chest tightness wheezing or shortness of breath High-dose flu vaccine today Prescription for RSV vaccine today Pneumonia vaccine next visit  We will see you back in 6 to 8 weeks or sooner if needed

## 2022-10-24 NOTE — Progress Notes (Signed)
Synopsis: Previously followed by [facility] for severe COPD, bronchiectasis, cigarette smoking and cough.  Has history of right lower lobe collapse.  Subjective:   PATIENT ID: Cynthia Martinez GENDER: female DOB: March 02, 1961, MRN: [id number]   HPI  Chief Complaint  Patient presents with   Consult    Occ cough in the morning . Congestion. Phlegm (yellow)      Cynthia Martinez has bronchiectasis and COPD and has been previously followed by my partners Drs. Ramaswamy and Clark. She comes to clinic today to reestablish care for the same.  She says that in general she has been feeling quite well and she says that she has no complaints today.  However, she has lost over 20 pounds in the last year.  She says that she coughs up mucus in the morning.  She will cough up a little bit during the day as well but typically most of it is in the morning.  She is using Breztri 2 puffs twice a day.  She denies any shortness of breath.  She says that she is only used albuterol 1-2 times in the last 6 months.  She has not had bronchitis or pneumonia since the last visit.  She says that she cannot explain the weight loss.  She tries to eat food but she continues to lose weight.  No hemoptysis.  No fevers, no chills, no night sweats.  Record review: Pulmonary records reviewed where the patient was seen in the context of cigarette smoking, severe COPD, previously followed by Dr. Chase Caller.  Had chronic cough and was treated with Advair which did not help.  No significant sinus congestion.  Cough had failed PPI therapy in the past.  Bronchoscopy performed by Dr. Carlis Abbott in 2021 showed thick copious secretions throughout the lungs, worse on the left.  EBUS was performed and showed no evidence of peribronchial mass or adenopathy.  Past Medical History:  Diagnosis Date   Anemia    Anxiety    COPD (chronic obstructive pulmonary disease) ([place])    Depression    denies   DUB (dysfunctional uterine bleeding)    Family  history of adverse reaction to anesthesia    ? sister had allergic reaction during a surgery when she was a teenager-rash   Hep C w/o coma, chronic (HCC)    in SVR   Hypertension    Pneumonia due to Haemophilus influenzae ([place]) 09/23/2015   Shortness of breath dyspnea      Family History  Problem Relation Age of Onset   Diabetes Other    Healthy Mother    Healthy Father      Social History   Socioeconomic History   Marital status: Single    Spouse name: Not on file   Number of children: Not on file   Years of education: Not on file   Highest education level: Not on file  Occupational History   Not on file  Tobacco Use   Smoking status: Former    Packs/day: 0.50    Years: 9.00    Total pack years: 4.50    Types: Cigarettes    Quit date: 07/19/2012    Years since quitting: 10.2   Smokeless tobacco: Never  Vaping Use   Vaping Use: Never used  Substance and Sexual Activity   Alcohol use: No    Alcohol/week: 0.0 standard drinks of alcohol   Drug use: No   Sexual activity: Not on file  Other Topics Concern   Not on file  Social History Narrative   Lives with fiancee in [place]   2 daughter in 20's and 30'3.   Worked as Secretary/administrator in past.         Social Determinants of Radio broadcast assistant Strain: Not on file  Food Insecurity: Not on file  Transportation Needs: Not on file  Physical Activity: Not on file  Stress: Not on file  Social Connections: Not on file  Intimate Partner Violence: Not on file     Allergies  Allergen Reactions   Hydrochlorothiazide Hives   Hydroxyzine Other (See Comments)    Felt weird, like skin crawling   Penicillins Hives    Did it involve swelling of the face/tongue/throat, SOB, or low BP? No Did it involve sudden or severe rash/hives, skin peeling, or any reaction on the inside of your mouth or nose? No Did you need to seek medical attention at a hospital or doctor's office? No When did it last happen?      10 + years If all  above answers are "NO", may proceed with cephalosporin use.    Sulfa Antibiotics Hives     Outpatient Medications Prior to Visit  Medication Sig Dispense Refill   albuterol (VENTOLIN HFA) 108 (90 Base) MCG/ACT inhaler Inhale 2 puffs into the lungs every 4 (four) hours as needed.     amLODipine (NORVASC) 10 MG tablet TAKE 1 TABLET (10 MG TOTAL) BY MOUTH DAILY. 30 tablet 2   aspirin-acetaminophen-caffeine (EXCEDRIN MIGRAINE) 891-694-50 MG tablet Take 1 tablet by mouth daily as needed for headache.     Budeson-Glycopyrrol-Formoterol (BREZTRI AEROSPHERE) 160-9-4.8 MCG/ACT AERO Inhale 1 puff into the lungs 2 (two) times daily.     citalopram (CELEXA) 20 MG tablet Take 20 mg by mouth daily.     ferrous sulfate 325 (65 FE) MG tablet Take 1 tablet (325 mg total) by mouth 2 (two) times daily with a meal. 60 tablet 3   magic mouthwash SOLN SWISH 30 ML BY MOUTH THREE TIMES DAILY AND SPIT. DO NOT SWALLOW.     Multiple Vitamin (MULTIVITAMIN WITH MINERALS) TABS tablet Take 1 tablet by mouth daily. Centrum Silver 50+     No facility-administered medications prior to visit.    Review of Systems  Constitutional:  Positive for weight loss. Negative for chills, fever and malaise/fatigue.  HENT:  Negative for congestion, nosebleeds, sinus pain and sore throat.   Eyes:  Negative for photophobia, pain and discharge.  Respiratory:  Positive for cough and sputum production. Negative for hemoptysis, shortness of breath and wheezing.   Cardiovascular:  Negative for chest pain, palpitations, orthopnea and leg swelling.  Gastrointestinal:  Negative for abdominal pain, constipation, diarrhea, nausea and vomiting.  Genitourinary:  Negative for dysuria, frequency, hematuria and urgency.  Musculoskeletal:  Negative for back pain, joint pain, myalgias and neck pain.  Skin:  Negative for itching and rash.  Neurological:  Negative for tingling, tremors, sensory change, speech change, focal weakness, seizures, weakness and  headaches.  Psychiatric/Behavioral:  Negative for memory loss, substance abuse and suicidal ideas. The patient is not nervous/anxious.       Objective:  Physical Exam   Vitals:   10/24/22 1423  BP: 128/80  Pulse: (!) 122  Temp: 98.1 F (36.7 C)  TempSrc: Oral  SpO2: 96%  Weight: 81 lb 6.4 oz (36.9 kg)  Height: _0  (1.6 m)    Gen: well appearing, but underweight HENT: NCAT, OP clear, neck supple without masses Eyes: PERRL, EOMi Lymph: no cervical  lymphadenopathy PULM: CTA B CV: RRR, no mgr, no JVD GI: BS+, soft, nontender, no hsm Derm: no rash or skin breakdown MSK: diminished bulk, normal tone Neuro: A&Ox4, CN II-XII intact, strength 5/5 in all 4 extremities Psyche: normal mood and affect   CBC    Component Value Date/Time   WBC 4.9 10/03/2020 1328   RBC 2.76 (L) 10/03/2020 1328   HGB 9.2 (L) 10/03/2020 1328   HGB 11.7 08/06/2018 1212   HCT 28.3 (L) 10/03/2020 1328   HCT 34.3 08/06/2018 1212   PLT 68 (L) 10/03/2020 1328   PLT 266 08/06/2018 1212   MCV 102.5 (H) 10/03/2020 1328   MCV 109 (H) 08/06/2018 1212   MCH 33.3 10/03/2020 1328   MCHC 32.5 10/03/2020 1328   RDW 28.0 (H) 10/03/2020 1328   RDW 19.0 (H) 08/06/2018 1212   LYMPHSABS 1.7 10/03/2020 1328   LYMPHSABS 1.4 08/06/2018 1212   MONOABS 0.1 10/03/2020 1328   EOSABS 0.0 10/03/2020 1328   EOSABS 0.0 08/06/2018 1212   BASOSABS 0.0 10/03/2020 1328   BASOSABS 0.0 08/06/2018 1212     Chest imaging: November 2020 CT chest images independently reviewed showing no emphysema but mild cylindrical bronchiectasis upper lobe predominant, bilateral tree-in-bud opacification, 6 mm pulmonary nodule right lower lobe  PFT:  Labs: January 2021 BAL grew haemophilus influenza, beta-lactamase negative, fungal and AFB cultures negative.  Path:  Echo:  Heart Catheterization:       Assessment & Plan:   No diagnosis found.  Discussion: Ms. Gsell returns to clinic today to establish care for  bronchiectasis and COPD.  I am quite worried about her because of the 20 pound weight loss.  There is multiple possible causes for this, I worry about the growing pulmonary nodule that was seen on the last CT scan of her chest and I worry about the possibility of a chronic lung infection.  Plan: Bronchiectasis with worsening weight loss: The key to bronchiectasis management is taking a balanced approach to your health: exercise, nutrition, mucociliary clearance, and attention to airway infection.  Exercise: Stay physically active: exercise multiple days per week with endurance and resistance activities  Nutrition: Drink at least 64oz of water a day unless directed otherwise by a physician Your diet should be well balanced with a heavy focus on unprocessed fruits and vegetables Make it a goal to eat 1.2g/kg body weight of lean protein a day.  For example, if you weigh 50 kg then try to eat 60 grams of fish, egg white protein, lentils in a day  Mucociliary clearance: This term refers to the physical effort you make routinely to help clear mucus out of your lungs Use a flutter valve 8-10 breaths, 2 times per day As a reminder, most patients with bronchiectasis produce mucus every day  Attention to airway infection: Please provide [place] with a sample of your mucus: We will test for bacterial, fungal, AFB culture If you experience increasing chest congestion, mucus production, change in mucus color, shortness of breath, or fever I need to know right away  We will check a high-resolution CT scan of your chest  Weight loss: See above  COPD: Continue Breztri 2 puffs twice a day no matter how you feel Continue albuterol as needed for chest tightness wheezing or shortness of breath High-dose flu vaccine today Prescription for RSV vaccine today Pneumonia vaccine next visit  We will see you back in 6 to 8 weeks or sooner if needed  Immunizations: Immunization History  Administered Date(s)  Administered   Hepatitis A 05/10/2013   Hepatitis A, Adult 08/19/2014   Hepatitis B 05/10/2013   Hepatitis B, PED/ADOLESCENT 08/19/2014, 08/04/2015   Influenza,inj,Quad PF,6+ Mos 10/01/2019     Current Outpatient Medications:    albuterol (VENTOLIN HFA) 108 (90 Base) MCG/ACT inhaler, Inhale 2 puffs into the lungs every 4 (four) hours as needed., Disp: , Rfl:    amLODipine (NORVASC) 10 MG tablet, TAKE 1 TABLET (10 MG TOTAL) BY MOUTH DAILY., Disp: 30 tablet, Rfl: 2   aspirin-acetaminophen-caffeine (EXCEDRIN MIGRAINE) 250-250-65 MG tablet, Take 1 tablet by mouth daily as needed for headache., Disp: , Rfl:    Budeson-Glycopyrrol-Formoterol (BREZTRI AEROSPHERE) 160-9-4.8 MCG/ACT AERO, Inhale 1 puff into the lungs 2 (two) times daily., Disp: , Rfl:    citalopram (CELEXA) 20 MG tablet, Take 20 mg by mouth daily., Disp: , Rfl:    ferrous sulfate 325 (65 FE) MG tablet, Take 1 tablet (325 mg total) by mouth 2 (two) times daily with a meal., Disp: 60 tablet, Rfl: 3   magic mouthwash SOLN, SWISH 30 ML BY MOUTH THREE TIMES DAILY AND SPIT. DO NOT SWALLOW., Disp: , Rfl:    Multiple Vitamin (MULTIVITAMIN WITH MINERALS) TABS tablet, Take 1 tablet by mouth daily. Centrum Silver 50+, Disp: , Rfl:

## 2022-10-28 ENCOUNTER — Telehealth: Payer: Self-pay | Admitting: Pulmonary Disease

## 2022-10-28 DIAGNOSIS — J471 Bronchiectasis with (acute) exacerbation: Secondary | ICD-10-CM

## 2022-10-28 MED ORDER — DOXYCYCLINE HYCLATE 100 MG PO TABS: 100.0000 mg | ORAL_TABLET | Freq: Two times a day (BID) | ORAL | 0 refills | Status: DC

## 2022-10-28 NOTE — Telephone Encounter (Signed)
Cynthia Doom, MD  to Me     10/28/22  4:05 PM  Yes, I agree with antibiotics because generally she has been sick lately (20 pound weight loss).  Let's do doxycycline '100mg'$  po bid x 7 days, take with yogurt Increase flutter valve use Increase guaifenesin '1200mg'$  po bid  Call us if no improvement  I called and spoke with the pt and notified of response per Dr Lake Bells  Rx was sent  Nothing further needed

## 2022-10-28 NOTE — Telephone Encounter (Signed)
Spoke with the pt  She states having some sinus pressure/pain and ear pain bilaterally  She has some cough with yellow sputum  No fevers, wheezing, SOB  She is using neti pot and this helps but only for a short time  She is asking for abx Just seen here on 10/24/22  Please advise thanks!  Allergies  Allergen Reactions   Hydrochlorothiazide Hives   Hydroxyzine Other (See Comments)    Felt weird, like skin crawling   Penicillins Hives    Did it involve swelling of the face/tongue/throat, SOB, or low BP? No Did it involve sudden or severe rash/hives, skin peeling, or any reaction on the inside of your mouth or nose? No Did you need to seek medical attention at a hospital or doctor's office? No When did it last happen?      10 + years If all above answers are "NO", may proceed with cephalosporin use.    Sulfa Antibiotics Hives

## 2022-10-28 NOTE — Telephone Encounter (Signed)
Calling no abt antibx for a poss sinus infection. No call back since call on protein drinks 2 hrs ago she said. Triage on phn. Sending again as High Priority

## 2022-10-30 DIAGNOSIS — J471 Bronchiectasis with (acute) exacerbation: Secondary | ICD-10-CM | POA: Diagnosis not present

## 2022-11-05 ENCOUNTER — Ambulatory Visit (HOSPITAL_COMMUNITY): Admission: RE | Admit: 2022-11-05 | Payer: Medicare Other | Source: Ambulatory Visit

## 2022-11-21 ENCOUNTER — Ambulatory Visit: Payer: Medicare Other | Admitting: Pulmonary Disease

## 2022-11-21 NOTE — Progress Notes (Deleted)
Synopsis: Previously followed by [facility] for severe COPD, bronchiectasis, cigarette smoking and cough.  Has history of right lower lobe collapse.  Subjective:   PATIENT ID: Cynthia Martinez GENDER: female DOB: Aug 13, 1961, MRN: [id number]   HPI  No chief complaint on file.   ***last visit got a flu shot, needs CT chest and sputum culture  Past Medical History:  Diagnosis Date   Anemia    Anxiety    COPD (chronic obstructive pulmonary disease) ([place])    Depression    denies   DUB (dysfunctional uterine bleeding)    Family history of adverse reaction to anesthesia    ? sister had allergic reaction during a surgery when she was a teenager-rash   Hep C w/o coma, chronic (HCC)    in SVR   Hypertension    Pneumonia due to Haemophilus influenzae ([place]) 09/23/2015   Shortness of breath dyspnea        ROS ***   Objective:  Physical Exam   There were no vitals filed for this visit.   ***   CBC    Component Value Date/Time   WBC 4.9 10/03/2020 1328   RBC 2.76 (L) 10/03/2020 1328   HGB 9.2 (L) 10/03/2020 1328   HGB 11.7 08/06/2018 1212   HCT 28.3 (L) 10/03/2020 1328   HCT 34.3 08/06/2018 1212   PLT 68 (L) 10/03/2020 1328   PLT 266 08/06/2018 1212   MCV 102.5 (H) 10/03/2020 1328   MCV 109 (H) 08/06/2018 1212   MCH 33.3 10/03/2020 1328   MCHC 32.5 10/03/2020 1328   RDW 28.0 (H) 10/03/2020 1328   RDW 19.0 (H) 08/06/2018 1212   LYMPHSABS 1.7 10/03/2020 1328   LYMPHSABS 1.4 08/06/2018 1212   MONOABS 0.1 10/03/2020 1328   EOSABS 0.0 10/03/2020 1328   EOSABS 0.0 08/06/2018 1212   BASOSABS 0.0 10/03/2020 1328   BASOSABS 0.0 08/06/2018 1212     Chest imaging: November 2020 CT chest images independently reviewed showing no emphysema but mild cylindrical bronchiectasis upper lobe predominant, bilateral tree-in-bud opacification, 6 mm pulmonary nodule right lower lobe  PFT:  Labs: January 2021 BAL grew haemophilus influenza, beta-lactamase negative,  fungal and AFB cultures negative.  Path:  Echo:  Heart Catheterization:       Assessment & Plan:   No diagnosis found.  Discussion: ***  Immunizations: Immunization History  Administered Date(s) Administered   Hepatitis A 05/10/2013   Hepatitis A, Adult 08/19/2014   Hepatitis B 05/10/2013   Hepatitis B, PED/ADOLESCENT 08/19/2014, 08/04/2015   Influenza,inj,Quad PF,6+ Mos 10/01/2019     Current Outpatient Medications:    doxycycline (VIBRA-TABS) 100 MG tablet, Take 1 tablet (100 mg total) by mouth 2 (two) times daily., Disp: 14 tablet, Rfl: 0   albuterol (VENTOLIN HFA) 108 (90 Base) MCG/ACT inhaler, Inhale 2 puffs into the lungs every 4 (four) hours as needed., Disp: , Rfl:    amLODipine (NORVASC) 10 MG tablet, TAKE 1 TABLET (10 MG TOTAL) BY MOUTH DAILY., Disp: 30 tablet, Rfl: 2   aspirin-acetaminophen-caffeine (EXCEDRIN MIGRAINE) 250-250-65 MG tablet, Take 1 tablet by mouth daily as needed for headache., Disp: , Rfl:    Budeson-Glycopyrrol-Formoterol (BREZTRI AEROSPHERE) 160-9-4.8 MCG/ACT AERO, Inhale 1 puff into the lungs 2 (two) times daily., Disp: , Rfl:    citalopram (CELEXA) 20 MG tablet, Take 20 mg by mouth daily., Disp: , Rfl:    ferrous sulfate 325 (65 FE) MG tablet, Take 1 tablet (325 mg total) by mouth 2 (two) times daily  with a meal., Disp: 60 tablet, Rfl: 3   magic mouthwash SOLN, SWISH 30 ML BY MOUTH THREE TIMES DAILY AND SPIT. DO NOT SWALLOW., Disp: , Rfl:    Multiple Vitamin (MULTIVITAMIN WITH MINERALS) TABS tablet, Take 1 tablet by mouth daily. Centrum Silver 50+, Disp: , Rfl:

## 2022-12-11 DIAGNOSIS — L2089 Other atopic dermatitis: Secondary | ICD-10-CM | POA: Diagnosis not present

## 2022-12-11 DIAGNOSIS — L814 Other melanin hyperpigmentation: Secondary | ICD-10-CM | POA: Diagnosis not present

## 2022-12-13 ENCOUNTER — Ambulatory Visit
Admission: RE | Admit: 2022-12-13 | Discharge: 2022-12-13 | Disposition: A | Discharge status: Home / Self Care | Payer: 59 | Source: Ambulatory Visit | Attending: Pulmonary Disease | Admitting: Pulmonary Disease

## 2022-12-13 ENCOUNTER — Encounter (HOSPITAL_COMMUNITY): Payer: Self-pay

## 2022-12-13 ENCOUNTER — Ambulatory Visit (HOSPITAL_COMMUNITY)
Admission: EM | Admit: 2022-12-13 | Discharge: 2022-12-13 | Disposition: A | Discharge status: Home / Self Care | Payer: 59 | Source: Ambulatory Visit | Attending: Emergency Medicine | Admitting: Emergency Medicine

## 2022-12-13 VITALS — BP 115/76 | HR 107 | Temp 98.5°F | Resp 16 | Ht 63.0 in | Wt 108.0 lb

## 2022-12-13 DIAGNOSIS — J471 Bronchiectasis with (acute) exacerbation: Secondary | ICD-10-CM | POA: Diagnosis not present

## 2022-12-13 DIAGNOSIS — R202 Paresthesia of skin: Secondary | ICD-10-CM | POA: Insufficient documentation

## 2022-12-13 DIAGNOSIS — J439 Emphysema, unspecified: Secondary | ICD-10-CM | POA: Diagnosis not present

## 2022-12-13 DIAGNOSIS — J8410 Pulmonary fibrosis, unspecified: Secondary | ICD-10-CM | POA: Diagnosis not present

## 2022-12-13 DIAGNOSIS — J929 Pleural plaque without asbestos: Secondary | ICD-10-CM | POA: Diagnosis not present

## 2022-12-13 LAB — COMPREHENSIVE METABOLIC PANEL
ALT: 12 U/L (ref 0–44)
AST: 20 U/L (ref 15–41)
Albumin: 4.2 g/dL (ref 3.5–5.0)
Alkaline Phosphatase: 51 U/L (ref 38–126)
Anion gap: 11 (ref 5–15)
BUN: 11 mg/dL (ref 8–23)
CO2: 22 mmol/L (ref 22–32)
Calcium: 9.1 mg/dL (ref 8.9–10.3)
Chloride: 106 mmol/L (ref 98–111)
Creatinine, Ser: 0.58 mg/dL (ref 0.44–1.00)
GFR, Estimated: >60 mL/min (ref >60)
Glucose, Bld: 92 mg/dL (ref 70–99)
Potassium: 4 mmol/L (ref 3.5–5.1)
Sodium: 139 mmol/L (ref 135–145)
Total Bilirubin: 2.2 mg/dL — ABNORMAL HIGH (ref 0.3–1.2)
Total Protein: 6.8 g/dL (ref 6.5–8.1)

## 2022-12-13 LAB — CBC
HCT: 26.7 % — ABNORMAL LOW (ref 36.0–46.0)
Hemoglobin: 9.4 g/dL — ABNORMAL LOW (ref 12.0–15.0)
MCH: 42.3 pg — ABNORMAL HIGH (ref 26.0–34.0)
MCHC: 35.2 g/dL (ref 30.0–36.0)
MCV: 120.3 fL — ABNORMAL HIGH (ref 80.0–100.0)
Platelets: 180 10*3/uL (ref 150–400)
RBC: 2.22 MIL/uL — ABNORMAL LOW (ref 3.87–5.11)
RDW: 21.3 % — ABNORMAL HIGH (ref 11.5–15.5)
WBC: 5.2 10*3/uL (ref 4.0–10.5)
nRBC: 0.6 % — ABNORMAL HIGH (ref 0.0–0.2)

## 2022-12-13 LAB — VITAMIN B12: Vitamin B-12: 114 pg/mL — ABNORMAL LOW (ref 180–914)

## 2022-12-13 NOTE — ED Provider Notes (Addendum)
[place]    CSN: [id number] Arrival date & time: 12/13/22  1345     History   Chief Complaint Chief Complaint  Patient presents with   Numbness    HPI Cynthia Martinez is a 62 y.o. female.  Presents with 1 week history of numbness, only in the fingers and toes Denies any pain, weakness, injury, discoloration No other symptoms including dizziness, headache, shortness of breath, fatigue  History of B12 deficiency. Last labs were in 2021, level < 50 Used to get injections, stopped these a while ago  Past Medical History:  Diagnosis Date   Anemia    Anxiety    COPD (chronic obstructive pulmonary disease) ([place])    Depression    denies   DUB (dysfunctional uterine bleeding)    Family history of adverse reaction to anesthesia    ? sister had allergic reaction during a surgery when she was a teenager-rash   Hep C w/o coma, chronic ([place])    in SVR   Hypertension    Pneumonia due to Haemophilus influenzae ([place]) 09/23/2015   Shortness of breath dyspnea     Patient Active Problem List   Diagnosis Date Noted   Symptomatic anemia 10/02/2020   B12 deficiency 10/02/2020   Rash and nonspecific skin eruption 09/22/2017   Hypertrophy of inferior nasal turbinate 01/23/2017   Dysfunction of both eustachian tubes 10/28/2016   Dysphagia    Depression 08/07/2015   Cholelithiasis 08/03/2015   Allergic rhinitis 09/17/2014   Poor dentition 08/19/2014   Health care maintenance 08/19/2014   COPD, severe ([place]) 10/18/2012   Chronic hepatitis C virus infection ([place]) 04/30/2012   Underweight 03/19/2012   Pernicious anemia 12/20/2011   Hypertension 12/20/2011    Past Surgical History:  Procedure Laterality Date   POLYPECTOMY     VIDEO BRONCHOSCOPY WITH ENDOBRONCHIAL ULTRASOUND N/A 12/06/2019   Procedure: VIDEO BRONCHOSCOPY WITH ENDOBRONCHIAL ULTRASOUND WITH BAL;  Surgeon: Julian Hy, DO;  Location: [facility] OR;  Service: Thoracic;  Laterality: N/A;    OB History   No  obstetric history on file.      Home Medications    Prior to Admission medications   Medication Sig Start Date End Date Taking? Authorizing Provider  albuterol (VENTOLIN HFA) 108 (90 Base) MCG/ACT inhaler Inhale 2 puffs into the lungs every 4 (four) hours as needed. 09/25/22  Yes [provider]  amLODipine (NORVASC) 10 MG tablet TAKE 1 TABLET (10 MG TOTAL) BY MOUTH DAILY. 12/17/18  Yes Alphonzo Grieve, MD  aspirin-acetaminophen-caffeine (EXCEDRIN MIGRAINE) [phone] MG tablet Take 1 tablet by mouth daily as needed for headache.   Yes [provider]  Budeson-Glycopyrrol-Formoterol (BREZTRI AEROSPHERE) 160-9-4.8 MCG/ACT AERO Inhale 1 puff into the lungs 2 (two) times daily.   Yes [provider]  citalopram (CELEXA) 20 MG tablet Take 20 mg by mouth daily.    [provider]  ferrous sulfate 325 (65 FE) MG tablet Take 1 tablet (325 mg total) by mouth 2 (two) times daily with a meal. 10/03/20 11/02/20  George Hugh, MD  magic mouthwash SOLN SWISH 30 ML BY MOUTH THREE TIMES DAILY AND SPIT. DO NOT SWALLOW. 08/28/20   [provider]  Multiple Vitamin (MULTIVITAMIN WITH MINERALS) TABS tablet Take 1 tablet by mouth daily. Centrum Silver 50+    [provider]    Family History Family History  Problem Relation Age of Onset   Diabetes Other    Healthy Mother    Healthy Father  Social History Social History   Tobacco Use   Smoking status: Former    Packs/day: 0.50    Years: 9.00    Total pack years: 4.50    Types: Cigarettes    Quit date: 07/19/2012    Years since quitting: 10.4   Smokeless tobacco: Never  Vaping Use   Vaping Use: Never used  Substance Use Topics   Alcohol use: No    Alcohol/week: 0.0 standard drinks of alcohol   Drug use: No     Allergies   Hydrochlorothiazide, Hydroxyzine, Penicillins, and Sulfa antibiotics   Review of Systems Review of Systems As per HPI  Physical Exam Triage Vital Signs ED  Triage Vitals  Enc Vitals Group     BP 12/13/22 1409 115/76     Pulse Rate 12/13/22 1409 (!) 107     Resp 12/13/22 1409 16     Temp 12/13/22 1409 98.5 F (36.9 C)     Temp Source 12/13/22 1409 Oral     SpO2 12/13/22 1409 95 %     Weight 12/13/22 1408 108 lb (49 kg)     Height 12/13/22 1408 5' 3"  (1.6 m)     Head Circumference --      Peak Flow --      Pain Score 12/13/22 1406 0     Pain Loc --      Pain Edu? --      Excl. in [place]? --    No data found.  Updated Vital Signs BP 115/76 (BP Location: Right Arm)   Pulse (!) 107   Temp 98.5 F (36.9 C) (Oral)   Resp 16   Ht 5' 3"  (1.6 m)   Wt 108 lb (49 kg)   SpO2 95%   BMI 19.13 kg/m   Physical Exam Vitals and nursing note reviewed.  Constitutional:      General: She is not in acute distress. HENT:     Head: Normocephalic and atraumatic.     Mouth/Throat:     Mouth: Mucous membranes are moist.     Pharynx: Oropharynx is clear.  Eyes:     Extraocular Movements: Extraocular movements intact.     Conjunctiva/sclera: Conjunctivae normal.     Pupils: Pupils are equal, round, and reactive to light.  Cardiovascular:     Rate and Rhythm: Normal rate and regular rhythm.     Heart sounds: Normal heart sounds.  Pulmonary:     Effort: Pulmonary effort is normal. No respiratory distress.     Breath sounds: Normal breath sounds.  Musculoskeletal:        General: Normal range of motion.     Cervical back: Normal range of motion.  Neurological:     General: No focal deficit present.     Mental Status: She is alert and oriented to person, place, and time.     Cranial Nerves: Cranial nerves 2-12 are intact.     Sensory: Sensation is intact. No sensory deficit.     Motor: Motor function is intact. No weakness or pronator drift.     Coordination: Coordination is intact. Coordination normal.     Gait: Gait is intact. Gait normal.     Comments: Strength 5/5 throughout including grip strength. Sensation intact all fingers and toes. Cap  refill < 2 seconds. Strong pulses. Full ROM     UC Treatments / Results  Labs (all labs ordered are listed, but only abnormal results are displayed) Labs Reviewed  CBC - Abnormal; Notable for the  following components:      Result Value   RBC 2.22 (*)    Hemoglobin 9.4 (*)    HCT 26.7 (*)    MCV 120.3 (*)    MCH 42.3 (*)    RDW 21.3 (*)    nRBC 0.6 (*)    All other components within normal limits  COMPREHENSIVE METABOLIC PANEL - Abnormal; Notable for the following components:   Total Bilirubin 2.2 (*)    All other components within normal limits  VITAMIN B12 - Abnormal; Notable for the following components:   Vitamin B-12 114 (*)    All other components within normal limits    EKG   Radiology CT Chest High Resolution  Result Date: 12/13/2022 CLINICAL DATA:  Bronchiectasis, exacerbation, COPD, asthma, emphysema EXAM: CT CHEST WITHOUT CONTRAST TECHNIQUE: Multidetector CT imaging of the chest was performed following the standard protocol without intravenous contrast. High resolution imaging of the lungs, as well as inspiratory and expiratory imaging, was performed. RADIATION DOSE REDUCTION: This exam was performed according to the departmental dose-optimization program which includes automated exposure control, adjustment of the mA and/or kV according to patient size and/or use of iterative reconstruction technique. COMPARISON:  10/11/2019, 10/09/2018, 02/12/2016 FINDINGS: Cardiovascular: No significant vascular findings. Normal heart size. Left coronary artery calcifications. No pericardial effusion. Mediastinum/Nodes: No enlarged mediastinal, hilar, or axillary lymph nodes. Thyroid gland, trachea, and esophagus demonstrate no significant findings. 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 Bibasilar bronchial wall thickening with extensive bronchiolar plugging and very extensive, clustered centrilobular and tree-in-bud nodularity throughout inferior  right upper lobe, lingula, and lung bases, similar, although somewhat increased compared to prior examination. No pleural effusion or pneumothorax. Upper Abdomen: No acute abnormality. Musculoskeletal: No chest wall abnormality. No acute osseous findings. IMPRESSION: 1. Bibasilar bronchial wall thickening with extensive bronchiolar plugging and very extensive, clustered centrilobular and tree-in-bud nodularity throughout inferior right upper lobe, lingula, and lung bases, similar, although somewhat increased compared to prior examination. 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 2. 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 3. Coronary artery disease. Electronically Signed   By: Delanna Ahmadi M.D.   On: 12/13/2022 20:23    Procedures Procedures (including critical care time)  Medications Ordered in UC Medications - No data to display  Initial Impression / Assessment and Plan / UC Course  I have reviewed the triage vital signs and the nursing notes.  Pertinent labs & imaging results that were available during my care of the patient were reviewed by me and considered in my medical decision making (see chart for details).  CN 2-12 intact, no red flags on exam or history Suspicion for low B12 given her previous lab Discussed with patient will check some basic labs CBC, CMP, B12 pending. Recommend close follow up with PCP. Return precautions discussed. Patient agrees to plan  4:09 PM CBC notable for hgb 9.4. No change from 2 years ago Recommend re-start iron supplement   8:44PM Unremarkable CMP B12 is 114 which is improved from previous, although still low. Recommend supplement, contact PCP if she would like to restart injections  Final Clinical Impressions(s) / UC Diagnoses   Final diagnoses:  Paresthesias     Discharge Instructions      I will call you with any  abnormalities on your blood work  Please call your primary care provider to set up an appointment for further evaluation.     ED Prescriptions  None    PDMP not reviewed this encounter.   Norelle Runnion, Vernice Jefferson 12/13/22 1705    Christeena Krogh, [place] Gu[place], [place] 12/13/22 2045

## 2022-12-13 NOTE — ED Triage Notes (Signed)
Chief Complaint: numbness in the fingers and toes. Still fully functional, no pain, no discoloration.   Onset: 1 week   Prescriptions or OTC medications tried: No    New foods, medications, or products: No

## 2022-12-13 NOTE — Discharge Instructions (Addendum)
I will call you with any abnormalities on your blood work  Please call your primary care provider to set up an appointment for further evaluation.

## 2022-12-16 ENCOUNTER — Telehealth: Payer: Self-pay | Admitting: *Deleted

## 2022-12-16 ENCOUNTER — Encounter: Payer: Self-pay | Admitting: Adult Health

## 2022-12-16 ENCOUNTER — Ambulatory Visit (INDEPENDENT_AMBULATORY_CARE_PROVIDER_SITE_OTHER): Payer: 59 | Admitting: Adult Health

## 2022-12-16 VITALS — BP 120/70 | HR 105 | Temp 97.5°F | Ht 63.0 in | Wt 87.0 lb

## 2022-12-16 DIAGNOSIS — Z1212 Encounter for screening for malignant neoplasm of rectum: Secondary | ICD-10-CM | POA: Diagnosis not present

## 2022-12-16 DIAGNOSIS — J449 Chronic obstructive pulmonary disease, unspecified: Secondary | ICD-10-CM

## 2022-12-16 DIAGNOSIS — E43 Unspecified severe protein-calorie malnutrition: Secondary | ICD-10-CM | POA: Diagnosis not present

## 2022-12-16 DIAGNOSIS — D510 Vitamin B12 deficiency anemia due to intrinsic factor deficiency: Secondary | ICD-10-CM | POA: Diagnosis not present

## 2022-12-16 DIAGNOSIS — R000 Tachycardia, unspecified: Secondary | ICD-10-CM | POA: Diagnosis not present

## 2022-12-16 DIAGNOSIS — E46 Unspecified protein-calorie malnutrition: Secondary | ICD-10-CM | POA: Insufficient documentation

## 2022-12-16 DIAGNOSIS — Z1211 Encounter for screening for malignant neoplasm of colon: Secondary | ICD-10-CM | POA: Diagnosis not present

## 2022-12-16 DIAGNOSIS — J479 Bronchiectasis, uncomplicated: Secondary | ICD-10-CM | POA: Diagnosis not present

## 2022-12-16 NOTE — Assessment & Plan Note (Signed)
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  Previous BAL was negative for AFB.  Unable to produce sputum sample.  No acute symptoms.  Will continue to follow closely.  Check PFTs on return

## 2022-12-16 NOTE — Progress Notes (Signed)
@Patient  ID: Cynthia Martinez, female    DOB: 1961-08-13, 62 y.o.   MRN: [id number]  Chief Complaint  Patient presents with   Follow-up    Referring provider: Lucianne Lei, MD  HPI: 62 year old female former smoker -minimal smoking history seen for pulmonary consult in October 24, 2022 to reestablish for COPD and bronchiectasis. Significant for previous right lower lobe collapse History of Anemia , Pernicious Anemia   TEST/EVENTS :  November 2020 CT chest images independently reviewed showing no emphysema but mild cylindrical bronchiectasis upper lobe predominant, bilateral tree-in-bud opacification, 6 mm pulmonary nodule right lower lobe   PFT:   Labs: January 2021 BAL grew haemophilus influenza, beta-lactamase negative, fungal and AFB cultures negative.    12/16/2022 Follow up : COPD , Bronchiectasis  Patient presents for a 6-week follow-up.  Patient was seen last visit to reestablish for COPD and bronchiectasis. Patient was recommended to continue on Breztri twice daily. Says she mostly uses once daily in am. Says overall breathing is doing okay. No flare of cough or wheezing . Has minimal cough in am. Rare albuterol use.  She was set up for a high-resolution CT chest that was completed on December 13, 2022 that showed complete fibrosis and consolidation of the right middle lobe.  Bibasilar bronchial wall thickening and extensive bronchial plugging with very extensive tree-in bud nodularity through the right upper lobe, lingula and lung bases.  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  Stable fibrosis and consolidation of the right middle lobe.  We discussed CT results in detail.  Patient says overall she really feels okay.  She does not have any significant symptoms currently. Her weight does remain low.  Current weight is at 87 pounds.  Patient says she is eating a very high protein diet.   Is following with her primary care  provider today for ongoing anemia.  Recently seen in urgent care for paresthesias.  B12 level was low and she remains anemic.   Allergies  Allergen Reactions   Hydrochlorothiazide Hives   Hydroxyzine Other (See Comments)    Felt weird, like skin crawling   Penicillins Hives    Did it involve swelling of the face/tongue/throat, SOB, or low BP? No Did it involve sudden or severe rash/hives, skin peeling, or any reaction on the inside of your mouth or nose? No Did you need to seek medical attention at a hospital or doctor's office? No When did it last happen?      10 + years If all above answers are "NO", may proceed with cephalosporin use.    Sulfa Antibiotics Hives    Immunization History  Administered Date(s) Administered   Hepatitis A 05/10/2013   Hepatitis A, Adult 08/19/2014   Hepatitis B 05/10/2013   Hepatitis B, PED/ADOLESCENT 08/19/2014, 08/04/2015   Influenza,inj,Quad PF,6+ Mos 10/01/2019, 10/24/2022    Past Medical History:  Diagnosis Date   Anemia    Anxiety    COPD (chronic obstructive pulmonary disease) ([place])    Depression    denies   DUB (dysfunctional uterine bleeding)    Family history of adverse reaction to anesthesia    ? sister had allergic reaction during a surgery when she was a teenager-rash   Hep C w/o coma, chronic ([facility])    in SVR   Hypertension    Pneumonia due to Haemophilus influenzae ([place]) 09/23/2015   Shortness of breath dyspnea     Tobacco History: Social History   Tobacco Use  Smoking  Status Former   Packs/day: 0.50   Years: 9.00   Total pack years: 4.50   Types: Cigarettes   Quit date: 07/19/2012   Years since quitting: 10.4  Smokeless Tobacco Never   Counseling given: Not Answered   Outpatient Medications Prior to Visit  Medication Sig Dispense Refill   albuterol (VENTOLIN HFA) 108 (90 Base) MCG/ACT inhaler Inhale 2 puffs into the lungs every 4 (four) hours as needed.     amLODipine (NORVASC) 10 MG tablet TAKE 1 TABLET (10 MG  TOTAL) BY MOUTH DAILY. 30 tablet 2   aspirin-acetaminophen-caffeine (EXCEDRIN MIGRAINE) 016-010-93 MG tablet Take 1 tablet by mouth daily as needed for headache.     Budeson-Glycopyrrol-Formoterol (BREZTRI AEROSPHERE) 160-9-4.8 MCG/ACT AERO Inhale 1 puff into the lungs 2 (two) times daily.     citalopram (CELEXA) 20 MG tablet Take 20 mg by mouth daily.     magic mouthwash SOLN SWISH 30 ML BY MOUTH THREE TIMES DAILY AND SPIT. DO NOT SWALLOW.     Multiple Vitamin (MULTIVITAMIN WITH MINERALS) TABS tablet Take 1 tablet by mouth daily. Centrum Silver 50+     ferrous sulfate 325 (65 FE) MG tablet Take 1 tablet (325 mg total) by mouth 2 (two) times daily with a meal. 60 tablet 3   No facility-administered medications prior to visit.     Review of Systems:   Constitutional:   No  weight loss, night sweats,  Fevers, chills, fatigue, or  lassitude.  HEENT:   No headaches,  Difficulty swallowing,  Tooth/dental problems, or  Sore throat,                No sneezing, itching, ear ache, nasal congestion, post nasal drip,   CV:  No chest pain,  Orthopnea, PND, swelling in lower extremities, anasarca, dizziness, palpitations, syncope.   GI  No heartburn, indigestion, abdominal pain, nausea, vomiting, diarrhea, change in bowel habits, loss of appetite, bloody stools.   Resp: No shortness of breath with exertion or at rest.  No excess mucus, no productive cough,  No non-productive cough,  No coughing up of blood.  No change in color of mucus.  No wheezing.  No chest wall deformity  Skin: no rash or lesions.  GU: no dysuria, change in color of urine, no urgency or frequency.  No flank pain, no hematuria   MS:  No joint pain or swelling.  No decreased range of motion.  No back pain.    Physical Exam  BP 120/70 (BP Location: Left Arm, Patient Position: Sitting, Cuff Size: Normal)   Pulse (!) 117   Temp (!) 97.5 F (36.4 C) (Oral)   Ht 5' 3"  (1.6 m)   Wt 87 lb (39.5 kg)   SpO2 100%   BMI 15.41  kg/m   GEN: A/Ox3; pleasant , NAD, well nourished    HEENT:  [place]/AT,  EACs-clear, TMs-wnl, NOSE-clear, THROAT-clear, no lesions, no postnasal drip or exudate noted.   NECK:  Supple w/ fair ROM; no JVD; normal carotid impulses w/o bruits; no thyromegaly or nodules palpated; no lymphadenopathy.    RESP  Clear  P & A; w/o, wheezes/ rales/ or rhonchi. no accessory muscle use, no dullness to percussion  CARD:  RRR, no m/r/g, no peripheral edema, pulses intact, no cyanosis or clubbing.  GI:   Soft & nt; nml bowel sounds; no organomegaly or masses detected.   Musco: Warm bil, no deformities or joint swelling noted.   Neuro: alert, no focal deficits noted.  Skin: Warm, no lesions or rashes    Lab Results:  CBC    Component Value Date/Time   WBC 5.2 12/13/2022 1443   RBC 2.22 (L) 12/13/2022 1443   HGB 9.4 (L) 12/13/2022 1443   HGB 11.7 08/06/2018 1212   HCT 26.7 (L) 12/13/2022 1443   HCT 34.3 08/06/2018 1212   PLT 180 12/13/2022 1443   PLT 266 08/06/2018 1212   MCV 120.3 (H) 12/13/2022 1443   MCV 109 (H) 08/06/2018 1212   MCH 42.3 (H) 12/13/2022 1443   MCHC 35.2 12/13/2022 1443   RDW 21.3 (H) 12/13/2022 1443   RDW 19.0 (H) 08/06/2018 1212   LYMPHSABS 1.7 10/03/2020 1328   LYMPHSABS 1.4 08/06/2018 1212   MONOABS 0.1 10/03/2020 1328   EOSABS 0.0 10/03/2020 1328   EOSABS 0.0 08/06/2018 1212   BASOSABS 0.0 10/03/2020 1328   BASOSABS 0.0 08/06/2018 1212    BMET    Component Value Date/Time   NA 139 12/13/2022 1443   NA 142 08/06/2018 1212   K 4.0 12/13/2022 1443   CL 106 12/13/2022 1443   CO2 22 12/13/2022 1443   GLUCOSE 92 12/13/2022 1443   BUN 11 12/13/2022 1443   BUN 8 08/06/2018 1212   CREATININE 0.58 12/13/2022 1443   CREATININE 0.75 04/13/2015 1219   CALCIUM 9.1 12/13/2022 1443   GFRNONAA >60 12/13/2022 1443   GFRNONAA >89 04/13/2015 1219   GFRAA >60 12/01/2019 1145   GFRAA >89 04/13/2015 1219    BNP No results found for: "BNP"  ProBNP No results  found for: "PROBNP"  Imaging: CT Chest High Resolution  Result Date: 12/13/2022 CLINICAL DATA:  Bronchiectasis, exacerbation, COPD, asthma, emphysema EXAM: CT CHEST WITHOUT CONTRAST TECHNIQUE: Multidetector CT imaging of the chest was performed following the standard protocol without intravenous contrast. High resolution imaging of the lungs, as well as inspiratory and expiratory imaging, was performed. RADIATION DOSE REDUCTION: This exam was performed according to the departmental dose-optimization program which includes automated exposure control, adjustment of the mA and/or kV according to patient size and/or use of iterative reconstruction technique. COMPARISON:  10/11/2019, 10/09/2018, 02/12/2016 FINDINGS: Cardiovascular: No significant vascular findings. Normal heart size. Left coronary artery calcifications. No pericardial effusion. Mediastinum/Nodes: No enlarged mediastinal, hilar, or axillary lymph nodes. Thyroid gland, trachea, and esophagus demonstrate no significant findings. 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 Bibasilar bronchial wall thickening with extensive bronchiolar plugging and very extensive, clustered centrilobular and tree-in-bud nodularity throughout inferior right upper lobe, lingula, and lung bases, similar, although somewhat increased compared to prior examination. No pleural effusion or pneumothorax. Upper Abdomen: No acute abnormality. Musculoskeletal: No chest wall abnormality. No acute osseous findings. IMPRESSION: 1. Bibasilar bronchial wall thickening with extensive bronchiolar plugging and very extensive, clustered centrilobular and tree-in-bud nodularity throughout inferior right upper lobe, lingula, and lung bases, similar, although somewhat increased compared to prior examination. 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 2. Complete fibrosis and  consolidation of the right middle lobe, unchanged. 3. Coronary artery disease. Electronically Signed   By: Delanna Ahmadi M.D.   On: 12/13/2022 20:23          No data to display          No results found for: "NITRICOXIDE"      Assessment & Plan:   No problem-specific Assessment & Plan notes found for this encounter.     Rexene Edison, NP 12/16/2022

## 2022-12-16 NOTE — Assessment & Plan Note (Signed)
Continue with high-protein diet. 

## 2022-12-16 NOTE — Addendum Note (Signed)
Addended by: Vanessa Barbara on: 12/16/2022 05:20 PM   Modules accepted: Orders

## 2022-12-16 NOTE — Telephone Encounter (Signed)
ATC patient x1.  LVM regarding PFT.  Advised that the spirometry/DLCO (30 min) breathing test the door can stay open during the test and Tammy is fine with her doing that instead of the 1 hour test.  Advised to call back to schedule.

## 2022-12-16 NOTE — Patient Instructions (Addendum)
Activity as tolerated  High protein diet  Continue on BREZTRI 2 puffs Twice daily , rinse after use.  Albuterol inhaler As needed   Prevnar 20 vaccine today .  Follow up with Dr. Criss Rosales today as planned regarding anemia and tachycardia.  Follow up with Dr. Lake Bells in 3 months with PFTs and As needed

## 2022-12-16 NOTE — Addendum Note (Signed)
Addended by: Vanessa Barbara on: 12/16/2022 02:48 PM   Modules accepted: Orders

## 2022-12-16 NOTE — Assessment & Plan Note (Signed)
History of COPD.  Needs repeat PFTs.  For now continue on Breztri. Chronic bronchiectatic changes on CT, history of severe pneumonia in the pas , chronic changes of right middle lobe appear stable on CT. Patient has no significant symptoms Previous BAL was negative for Mycobacterium./She is unable to produce a sputum for analysis.  Plan  Patient Instructions  Activity as tolerated  High protein diet  Continue on BREZTRI 2 puffs Twice daily , rinse after use.  Albuterol inhaler As needed   Prevnar 20 vaccine today .  Follow up with Dr. Criss Rosales today as planned regarding anemia and tachycardia.  Follow up with Dr. Lake Bells in 3 months with PFTs and As needed

## 2022-12-16 NOTE — Assessment & Plan Note (Signed)
Continue follow-up with primary care 

## 2022-12-16 NOTE — Assessment & Plan Note (Signed)
Mild tachycardia patient is a symptomatic.  Questionable related to underlying anemia.  CT chest showed some mild coronary artery calcifications.  Advised patient to follow-up with primary care for evaluation

## 2022-12-17 ENCOUNTER — Telehealth: Payer: Self-pay | Admitting: Adult Health

## 2022-12-17 DIAGNOSIS — R202 Paresthesia of skin: Secondary | ICD-10-CM | POA: Diagnosis not present

## 2022-12-17 DIAGNOSIS — D649 Anemia, unspecified: Secondary | ICD-10-CM | POA: Diagnosis not present

## 2022-12-18 NOTE — Telephone Encounter (Signed)
Cynthia Doom, MD  to Cynthia Needles, NP  [facility] Triage [facility]     12/18/22  2:24 PM  Agree Please send sputum culture for routine culture, fungus culture and afb culture  Spoke with the pt and notified of response per Tammy and Dr Lake Bells  She verbalized understanding  Labs already placed and she will try to produce a sputum for [place]  Nothing further needed at this time

## 2022-12-18 NOTE — Telephone Encounter (Signed)
Recommend Breztri Twice daily  .   CT chest showed chronic changes , recommend sputum cx to r/o atypical infection such as MAI . If not able to give sputum cx  Only other way is FOB with BAL - she did this in 2021 .   Will send to Dr. Lake Bells to see if he has any other suggestions   Please contact office for sooner follow up if symptoms do not improve or worsen or seek emergency care

## 2022-12-18 NOTE — Telephone Encounter (Signed)
Called and spoke with patient.  Patient stated she was told she had infection in her right lower lobe lung. Patient stated she is concerned about getting worse and is wanting treatment.   Advised patient per CT results and LOV notes sputum samples were requested to check patient for mycobacterium.  Explained bronchiectasis and need for sputum samples. Patient stated she is not coughing everyday and is unable to produce sputum for sample. Patient stated she is using her inhaler as instructed by Tammy, NP, but she feels she is needed more treatment options.   LOV 12/16/22 Tammy,NP  Instructions  Activity as tolerated  High protein diet  Continue on BREZTRI 2 puffs Twice daily , rinse after use.  Albuterol inhaler As needed   Prevnar 20 vaccine today .  Follow up with Dr. Criss Rosales today as planned regarding anemia and tachycardia.  Follow up with Dr. Lake Bells in 3 months with PFTs and As needed        Message routed to [facility], NP to advise

## 2022-12-20 ENCOUNTER — Telehealth: Payer: Self-pay | Admitting: Adult Health

## 2022-12-20 NOTE — Progress Notes (Signed)
Revivewed, agree

## 2022-12-20 NOTE — Telephone Encounter (Signed)
Left message for patient to call back  

## 2022-12-31 ENCOUNTER — Other Ambulatory Visit: Payer: Self-pay | Admitting: Family Medicine

## 2022-12-31 DIAGNOSIS — Z1231 Encounter for screening mammogram for malignant neoplasm of breast: Secondary | ICD-10-CM

## 2023-01-01 DIAGNOSIS — D649 Anemia, unspecified: Secondary | ICD-10-CM | POA: Diagnosis not present

## 2023-01-01 DIAGNOSIS — E539 Vitamin B deficiency, unspecified: Secondary | ICD-10-CM | POA: Diagnosis not present

## 2023-01-02 DIAGNOSIS — D649 Anemia, unspecified: Secondary | ICD-10-CM | POA: Diagnosis not present

## 2023-01-02 DIAGNOSIS — E539 Vitamin B deficiency, unspecified: Secondary | ICD-10-CM | POA: Diagnosis not present

## 2023-01-03 DIAGNOSIS — D649 Anemia, unspecified: Secondary | ICD-10-CM | POA: Diagnosis not present

## 2023-01-03 DIAGNOSIS — E539 Vitamin B deficiency, unspecified: Secondary | ICD-10-CM | POA: Diagnosis not present

## 2023-01-06 DIAGNOSIS — D649 Anemia, unspecified: Secondary | ICD-10-CM | POA: Diagnosis not present

## 2023-01-06 DIAGNOSIS — E539 Vitamin B deficiency, unspecified: Secondary | ICD-10-CM | POA: Diagnosis not present

## 2023-01-07 DIAGNOSIS — E539 Vitamin B deficiency, unspecified: Secondary | ICD-10-CM | POA: Diagnosis not present

## 2023-01-07 DIAGNOSIS — D649 Anemia, unspecified: Secondary | ICD-10-CM | POA: Diagnosis not present

## 2023-01-08 ENCOUNTER — Telehealth: Payer: Self-pay

## 2023-01-08 DIAGNOSIS — E539 Vitamin B deficiency, unspecified: Secondary | ICD-10-CM | POA: Diagnosis not present

## 2023-01-08 DIAGNOSIS — D649 Anemia, unspecified: Secondary | ICD-10-CM | POA: Diagnosis not present

## 2023-01-09 DIAGNOSIS — D649 Anemia, unspecified: Secondary | ICD-10-CM | POA: Diagnosis not present

## 2023-01-09 DIAGNOSIS — E539 Vitamin B deficiency, unspecified: Secondary | ICD-10-CM | POA: Diagnosis not present

## 2023-01-14 DIAGNOSIS — E539 Vitamin B deficiency, unspecified: Secondary | ICD-10-CM | POA: Diagnosis not present

## 2023-01-14 DIAGNOSIS — D649 Anemia, unspecified: Secondary | ICD-10-CM | POA: Diagnosis not present

## 2023-01-16 ENCOUNTER — Telehealth: Payer: Self-pay | Admitting: Adult Health

## 2023-01-16 NOTE — Telephone Encounter (Signed)
Patient would like to know if ok to take Pregabalin 150 mg.Patient phone number is [phone].

## 2023-01-17 NOTE — Telephone Encounter (Signed)
Spoke with pt who sates PCP had her start taking pregabalin of numbness in fingers and toes. Pt wants to know if Dr. Lake Bells thinks it safe for her to be taking. Pt has already taken 2 doses without any side effects. Dr. Lake Bells please advise.

## 2023-01-17 NOTE — Telephone Encounter (Signed)
Pt was called, pt has agreed to do spiro.

## 2023-01-21 ENCOUNTER — Encounter: Payer: Self-pay | Admitting: Diagnostic Neuroimaging

## 2023-01-21 ENCOUNTER — Ambulatory Visit (INDEPENDENT_AMBULATORY_CARE_PROVIDER_SITE_OTHER): Payer: 59 | Admitting: Diagnostic Neuroimaging

## 2023-01-21 VITALS — BP 127/88 | HR 87 | Ht 63.0 in | Wt 86.6 lb

## 2023-01-21 DIAGNOSIS — R202 Paresthesia of skin: Secondary | ICD-10-CM | POA: Diagnosis not present

## 2023-01-21 DIAGNOSIS — D510 Vitamin B12 deficiency anemia due to intrinsic factor deficiency: Secondary | ICD-10-CM

## 2023-01-21 DIAGNOSIS — E538 Deficiency of other specified B group vitamins: Secondary | ICD-10-CM

## 2023-01-21 NOTE — Telephone Encounter (Signed)
ATC LVMTCB x 1  

## 2023-01-21 NOTE — Progress Notes (Signed)
[facility]  PATIENT: Cynthia Martinez DOB: October 03, 1961  REFERRING CLINICIAN: Lucianne Lei, MD HISTORY FROM: patient  REASON FOR VISIT: new consult   HISTORICAL  CHIEF COMPLAINT:  Chief Complaint  Patient presents with   New Patient (Initial Visit)    Patient in room #7 and alone. Pt states she has numbness in her fingers and toes that started in January.     HISTORY OF PRESENT ILLNESS:   62 year old female here for evaluation of numbness and tingling.  Symptoms started about few months ago.  History of B12 deficiency and pernicious anemia.  B12 level recently was 114.  She has recently started replacement B12 injections.  Numbness tingling sensation and some are improving.  She was prescribed pregabalin for pain but patient denies any pain currently.   REVIEW OF SYSTEMS: Full 14 system review of systems performed and negative with exception of: as per HPI.  ALLERGIES: Allergies  Allergen Reactions   Hydrochlorothiazide Hives   Hydroxyzine Other (See Comments)    Felt weird, like skin crawling   Penicillins Hives    Did it involve swelling of the face/tongue/throat, SOB, or low BP? No Did it involve sudden or severe rash/hives, skin peeling, or any reaction on the inside of your mouth or nose? No Did you need to seek medical attention at a hospital or doctor's office? No When did it last happen?      10 + years If all above answers are "NO", may proceed with cephalosporin use.    Sulfa Antibiotics Hives    HOME MEDICATIONS: Outpatient Medications Prior to Visit  Medication Sig Dispense Refill   albuterol (VENTOLIN HFA) 108 (90 Base) MCG/ACT inhaler Inhale 2 puffs into the lungs every 4 (four) hours as needed.     amLODipine (NORVASC) 10 MG tablet TAKE 1 TABLET (10 MG TOTAL) BY MOUTH DAILY. 30 tablet 2   aspirin-acetaminophen-caffeine (EXCEDRIN MIGRAINE) [id number] MG tablet Take 1 tablet by mouth daily as needed for headache.      Budeson-Glycopyrrol-Formoterol (BREZTRI AEROSPHERE) 160-9-4.8 MCG/ACT AERO Inhale 1 puff into the lungs 2 (two) times daily.     citalopram (CELEXA) 20 MG tablet Take 20 mg by mouth daily.     Cobalamin Combinations (B-12) [phone] MCG SUBL Place under the tongue.     magic mouthwash SOLN SWISH 30 ML BY MOUTH THREE TIMES DAILY AND SPIT. DO NOT SWALLOW.     Multiple Vitamin (MULTIVITAMIN WITH MINERALS) TABS tablet Take 1 tablet by mouth daily. Centrum Silver 50+     pregabalin (LYRICA) 75 MG capsule Take 75 mg by mouth 2 (two) times daily.     ferrous sulfate 325 (65 FE) MG tablet Take 1 tablet (325 mg total) by mouth 2 (two) times daily with a meal. 60 tablet 3   No facility-administered medications prior to visit.    PAST MEDICAL HISTORY: Past Medical History:  Diagnosis Date   Anemia    Anxiety    COPD (chronic obstructive pulmonary disease) ([place])    Depression    denies   DUB (dysfunctional uterine bleeding)    Family history of adverse reaction to anesthesia    ? sister had allergic reaction during a surgery when she was a teenager-rash   Hep C w/o coma, chronic (HCC)    in SVR   Hypertension    Pneumonia due to Haemophilus influenzae ([place]) 09/23/2015   Shortness of breath dyspnea     PAST SURGICAL HISTORY: Past Surgical History:  Procedure Laterality Date  POLYPECTOMY     VIDEO BRONCHOSCOPY WITH ENDOBRONCHIAL ULTRASOUND N/A 12/06/2019   Procedure: VIDEO BRONCHOSCOPY WITH ENDOBRONCHIAL ULTRASOUND WITH BAL;  Surgeon: Julian Hy, DO;  Location: [facility] OR;  Service: Thoracic;  Laterality: N/A;    FAMILY HISTORY: Family History  Problem Relation Age of Onset   Diabetes Other    Healthy Mother    Healthy Father     SOCIAL HISTORY: Social History   Socioeconomic History   Marital status: Single    Spouse name: Not on file   Number of children: Not on file   Years of education: Not on file   Highest education level: Not on file  Occupational History   Not on file   Tobacco Use   Smoking status: Former    Packs/day: 0.50    Years: 9.00    Total pack years: 4.50    Types: Cigarettes    Quit date: 07/19/2012    Years since quitting: 10.5   Smokeless tobacco: Never  Vaping Use   Vaping Use: Never used  Substance and Sexual Activity   Alcohol use: No    Alcohol/week: 0.0 standard drinks of alcohol   Drug use: No   Sexual activity: Not on file  Other Topics Concern   Not on file  Social History Narrative   Lives with fiancee in [place]   2 daughter in 55's and 41'3.   Worked as Secretary/administrator in past.         Social Determinants of Radio broadcast assistant Strain: Not on file  Food Insecurity: Not on file  Transportation Needs: Not on file  Physical Activity: Not on file  Stress: Not on file  Social Connections: Not on file  Intimate Partner Violence: Not on file     PHYSICAL EXAM  GENERAL EXAM/CONSTITUTIONAL: Vitals:  Vitals:   01/21/23 1139  BP: 127/88  Pulse: 87  Weight: 86 lb 9.6 oz (39.3 kg)  Height: '5\' 3"'$  (1.6 m)   Body mass index is 15.34 kg/m. Wt Readings from Last 3 Encounters:  01/21/23 86 lb 9.6 oz (39.3 kg)  12/16/22 87 lb (39.5 kg)  12/13/22 108 lb (49 kg)   Patient is in no distress; well developed, nourished and groomed; neck is supple  CARDIOVASCULAR: Examination of carotid arteries is normal; no carotid bruits Regular rate and rhythm, no murmurs Examination of peripheral vascular system by observation and palpation is normal  EYES: Ophthalmoscopic exam of optic discs and posterior segments is normal; no papilledema or hemorrhages No results found.  MUSCULOSKELETAL: Gait, strength, tone, movements noted in Neurologic exam below  NEUROLOGIC: MENTAL STATUS:      No data to display         awake, alert, oriented to person, place and time recent and remote memory intact normal attention and concentration language fluent, comprehension intact, naming intact fund of knowledge  appropriate  CRANIAL NERVE:  2nd - no papilledema on fundoscopic exam 2nd, 3rd, 4th, 6th - pupils equal and reactive to light, visual fields full to confrontation, extraocular muscles intact, no nystagmus 5th - facial sensation symmetric 7th - facial strength symmetric 8th - hearing intact 9th - palate elevates symmetrically, uvula midline 11th - shoulder shrug symmetric 12th - tongue protrusion midline  MOTOR:  normal bulk and tone, full strength in the BUE, BLE  SENSORY:  normal and symmetric to light touch, pinprick, temperature, vibration  COORDINATION:  finger-nose-finger, fine finger movements normal  REFLEXES:  deep tendon reflexes 1+ and symmetric  GAIT/STATION:  narrow based gait     DIAGNOSTIC DATA (LABS, IMAGING, TESTING) - I reviewed patient records, labs, notes, testing and imaging myself where available.  Lab Results  Component Value Date   WBC 5.2 12/13/2022   HGB 9.4 (L) 12/13/2022   HCT 26.7 (L) 12/13/2022   MCV 120.3 (H) 12/13/2022   PLT 180 12/13/2022      Component Value Date/Time   NA 139 12/13/2022 1443   NA 142 08/06/2018 1212   K 4.0 12/13/2022 1443   CL 106 12/13/2022 1443   CO2 22 12/13/2022 1443   GLUCOSE 92 12/13/2022 1443   BUN 11 12/13/2022 1443   BUN 8 08/06/2018 1212   CREATININE 0.58 12/13/2022 1443   CREATININE 0.75 04/13/2015 1219   CALCIUM 9.1 12/13/2022 1443   PROT 6.8 12/13/2022 1443   PROT 6.7 08/06/2018 1212   ALBUMIN 4.2 12/13/2022 1443   ALBUMIN 4.5 08/06/2018 1212   AST 20 12/13/2022 1443   ALT 12 12/13/2022 1443   ALKPHOS 51 12/13/2022 1443   BILITOT 2.2 (H) 12/13/2022 1443   BILITOT 1.0 08/06/2018 1212   GFRNONAA >60 12/13/2022 1443   GFRNONAA >89 04/13/2015 1219   GFRAA >60 12/01/2019 1145   GFRAA >89 04/13/2015 1219   Lab Results  Component Value Date   CHOL 162 12/19/2011   HDL 40 12/19/2011   LDLCALC 97 12/19/2011   TRIG 124 12/19/2011   CHOLHDL 4.1 12/19/2011   Lab Results  Component Value  Date   HGBA1C 5.8 (H) 12/19/2011   Lab Results  Component Value Date   VITAMINB12 114 (L) 12/13/2022   Vitamin B-12  Date Value Ref Range Status  12/13/2022 114 (L) 180 - 914 pg/mL Final    Comment:    (NOTE) This assay is not validated for testing neonatal or myeloproliferative syndrome specimens for Vitamin B12 levels. Performed at [facility], [place] [address] [place], [place] [address]   10/02/2020 <50 (L) 180 - 914 pg/mL Final    Comment:    Performed at [facility], [place] [address], [place], [place] [address]  08/06/2018 156 (L) 232 - 1,245 pg/mL Final  08/19/2014 761 211 - 911 pg/mL Final  02/18/2012 646 211 - 911 pg/mL Final  12/17/2011 76 (L) 211 - 911 pg/mL Final  12/15/2011 104 (L) 211 - 911 pg/mL Final   Lab Results  Component Value Date   TSH 0.95 09/16/2018   12/18/11  Component Ref Range & Units 11 yr ago  Intrinsic Factor Negative Positive      ASSESSMENT AND PLAN  62 y.o. year old female here with:   Dx:  1. B12 deficiency   2. Paresthesia   3. Pernicious anemia     PLAN:  B12 deficiency paresthesias (history of pernicious anemia, positive intrinsic factor antibody in 2013; neuro exam is normal; EMG/NCS not necessary) - continue B12 replacement injections - stop pregabalin (patient denies any pain)  Return for return to PCP.    Penni Bombard, MD [id number], 0000000 PM Certified in Neurology, Neurophysiology and [facility] [address] [place], [place] [address] [phone]

## 2023-01-21 NOTE — Patient Instructions (Addendum)
B12 deficiency paresthesias (history of pernicious anemia, positive intrinsic factor antibody in 2013; neuro exam is normal; EMG/NCS not necessary) - continue B12 replacement injections - stop pregabalin (patient denies any pain)

## 2023-01-22 DIAGNOSIS — E539 Vitamin B deficiency, unspecified: Secondary | ICD-10-CM | POA: Diagnosis not present

## 2023-01-22 DIAGNOSIS — D649 Anemia, unspecified: Secondary | ICD-10-CM | POA: Diagnosis not present

## 2023-01-22 NOTE — Telephone Encounter (Signed)
Called and spoke with pt letting her know the info per BQ and she verbalized understanding. Nothing further needed.

## 2023-01-28 DIAGNOSIS — E539 Vitamin B deficiency, unspecified: Secondary | ICD-10-CM | POA: Diagnosis not present

## 2023-01-28 DIAGNOSIS — D649 Anemia, unspecified: Secondary | ICD-10-CM | POA: Diagnosis not present

## 2023-01-29 NOTE — Telephone Encounter (Signed)
Pt given CT results via phone call on 12/20/22. Nothing further needed at this time.

## 2023-01-29 NOTE — Telephone Encounter (Signed)
Rerouting to Triage for 2nd attempt to contact and close.

## 2023-02-04 DIAGNOSIS — D509 Iron deficiency anemia, unspecified: Secondary | ICD-10-CM | POA: Diagnosis not present

## 2023-02-04 DIAGNOSIS — D529 Folate deficiency anemia, unspecified: Secondary | ICD-10-CM | POA: Diagnosis not present

## 2023-02-04 DIAGNOSIS — D519 Vitamin B12 deficiency anemia, unspecified: Secondary | ICD-10-CM | POA: Diagnosis not present

## 2023-02-25 ENCOUNTER — Ambulatory Visit: Payer: 59

## 2023-02-28 ENCOUNTER — Ambulatory Visit
Admission: RE | Admit: 2023-02-28 | Discharge: 2023-02-28 | Disposition: A | Discharge status: Home / Self Care | Payer: Medicare HMO | Source: Ambulatory Visit | Attending: Family Medicine | Admitting: Family Medicine

## 2023-02-28 DIAGNOSIS — Z1231 Encounter for screening mammogram for malignant neoplasm of breast: Secondary | ICD-10-CM

## 2023-03-04 ENCOUNTER — Other Ambulatory Visit: Payer: Self-pay | Admitting: Family Medicine

## 2023-03-04 DIAGNOSIS — R928 Other abnormal and inconclusive findings on diagnostic imaging of breast: Secondary | ICD-10-CM

## 2023-03-05 ENCOUNTER — Ambulatory Visit: Payer: 59 | Admitting: Diagnostic Neuroimaging

## 2023-03-10 ENCOUNTER — Ambulatory Visit: Payer: 59 | Admitting: Diagnostic Neuroimaging

## 2023-03-17 ENCOUNTER — Ambulatory Visit: Payer: Medicare HMO

## 2023-03-17 ENCOUNTER — Ambulatory Visit
Admission: RE | Admit: 2023-03-17 | Discharge: 2023-03-17 | Disposition: A | Discharge status: Home / Self Care | Payer: Medicare HMO | Source: Ambulatory Visit | Attending: Family Medicine | Admitting: Family Medicine

## 2023-03-17 DIAGNOSIS — R928 Other abnormal and inconclusive findings on diagnostic imaging of breast: Secondary | ICD-10-CM

## 2023-03-17 DIAGNOSIS — Z1231 Encounter for screening mammogram for malignant neoplasm of breast: Secondary | ICD-10-CM | POA: Diagnosis not present

## 2023-03-17 DIAGNOSIS — R92333 Mammographic heterogeneous density, bilateral breasts: Secondary | ICD-10-CM | POA: Diagnosis not present

## 2023-05-10 DIAGNOSIS — R1319 Other dysphagia: Secondary | ICD-10-CM | POA: Diagnosis not present

## 2023-05-10 DIAGNOSIS — R195 Other fecal abnormalities: Secondary | ICD-10-CM | POA: Diagnosis not present

## 2023-05-16 ENCOUNTER — Encounter: Payer: Self-pay | Admitting: Internal Medicine

## 2023-05-28 DIAGNOSIS — D509 Iron deficiency anemia, unspecified: Secondary | ICD-10-CM | POA: Diagnosis not present

## 2023-05-28 DIAGNOSIS — D518 Other vitamin B12 deficiency anemias: Secondary | ICD-10-CM | POA: Diagnosis not present

## 2023-05-28 DIAGNOSIS — I10 Essential (primary) hypertension: Secondary | ICD-10-CM | POA: Diagnosis not present

## 2023-05-28 DIAGNOSIS — E782 Mixed hyperlipidemia: Secondary | ICD-10-CM | POA: Diagnosis not present

## 2023-05-31 DIAGNOSIS — Z1211 Encounter for screening for malignant neoplasm of colon: Secondary | ICD-10-CM | POA: Diagnosis not present

## 2023-05-31 DIAGNOSIS — R195 Other fecal abnormalities: Secondary | ICD-10-CM | POA: Diagnosis not present

## 2023-06-09 ENCOUNTER — Ambulatory Visit: Payer: Medicare HMO | Admitting: Internal Medicine

## 2023-06-10 DIAGNOSIS — K635 Polyp of colon: Secondary | ICD-10-CM | POA: Diagnosis not present

## 2023-06-12 DIAGNOSIS — R195 Other fecal abnormalities: Secondary | ICD-10-CM | POA: Diagnosis not present

## 2023-06-12 DIAGNOSIS — R1319 Other dysphagia: Secondary | ICD-10-CM | POA: Diagnosis not present

## 2023-06-12 DIAGNOSIS — Z1211 Encounter for screening for malignant neoplasm of colon: Secondary | ICD-10-CM | POA: Diagnosis not present

## 2023-06-29 DIAGNOSIS — K635 Polyp of colon: Secondary | ICD-10-CM | POA: Diagnosis not present

## 2023-08-05 DIAGNOSIS — R1310 Dysphagia, unspecified: Secondary | ICD-10-CM | POA: Diagnosis not present

## 2023-08-13 DIAGNOSIS — K9171 Accidental puncture and laceration of a digestive system organ or structure during a digestive system procedure: Secondary | ICD-10-CM | POA: Diagnosis not present

## 2023-08-13 DIAGNOSIS — D131 Benign neoplasm of stomach: Secondary | ICD-10-CM | POA: Diagnosis not present

## 2023-08-13 DIAGNOSIS — R1310 Dysphagia, unspecified: Secondary | ICD-10-CM | POA: Diagnosis not present

## 2023-08-13 DIAGNOSIS — K449 Diaphragmatic hernia without obstruction or gangrene: Secondary | ICD-10-CM | POA: Diagnosis not present

## 2023-08-13 DIAGNOSIS — K222 Esophageal obstruction: Secondary | ICD-10-CM | POA: Diagnosis not present

## 2023-08-13 DIAGNOSIS — B3781 Candidal esophagitis: Secondary | ICD-10-CM | POA: Diagnosis not present

## 2023-08-13 DIAGNOSIS — K317 Polyp of stomach and duodenum: Secondary | ICD-10-CM | POA: Diagnosis not present

## 2023-09-02 DIAGNOSIS — K222 Esophageal obstruction: Secondary | ICD-10-CM | POA: Diagnosis not present

## 2023-09-02 DIAGNOSIS — Z860101 Personal history of adenomatous and serrated colon polyps: Secondary | ICD-10-CM | POA: Diagnosis not present

## 2023-10-10 DIAGNOSIS — L2089 Other atopic dermatitis: Secondary | ICD-10-CM | POA: Diagnosis not present

## 2023-10-10 DIAGNOSIS — Z79899 Other long term (current) drug therapy: Secondary | ICD-10-CM | POA: Diagnosis not present

## 2023-10-10 DIAGNOSIS — L815 Leukoderma, not elsewhere classified: Secondary | ICD-10-CM | POA: Diagnosis not present

## 2024-01-06 DIAGNOSIS — R202 Paresthesia of skin: Secondary | ICD-10-CM | POA: Diagnosis not present

## 2024-01-06 DIAGNOSIS — Z634 Disappearance and death of family member: Secondary | ICD-10-CM | POA: Diagnosis not present

## 2024-01-06 DIAGNOSIS — J449 Chronic obstructive pulmonary disease, unspecified: Secondary | ICD-10-CM | POA: Diagnosis not present

## 2024-01-06 DIAGNOSIS — D509 Iron deficiency anemia, unspecified: Secondary | ICD-10-CM | POA: Diagnosis not present

## 2024-01-06 DIAGNOSIS — D519 Vitamin B12 deficiency anemia, unspecified: Secondary | ICD-10-CM | POA: Diagnosis not present

## 2024-01-06 DIAGNOSIS — I10 Essential (primary) hypertension: Secondary | ICD-10-CM | POA: Diagnosis not present

## 2024-01-06 DIAGNOSIS — E782 Mixed hyperlipidemia: Secondary | ICD-10-CM | POA: Diagnosis not present

## 2024-02-02 ENCOUNTER — Encounter: Payer: Self-pay | Admitting: Adult Health

## 2024-02-05 ENCOUNTER — Other Ambulatory Visit: Payer: Self-pay | Admitting: Family Medicine

## 2024-02-05 DIAGNOSIS — Z1231 Encounter for screening mammogram for malignant neoplasm of breast: Secondary | ICD-10-CM

## 2024-03-17 ENCOUNTER — Ambulatory Visit
Admission: RE | Admit: 2024-03-17 | Discharge: 2024-03-17 | Disposition: A | Discharge status: Home / Self Care | Source: Ambulatory Visit | Attending: Family Medicine | Admitting: Family Medicine

## 2024-03-17 DIAGNOSIS — Z1231 Encounter for screening mammogram for malignant neoplasm of breast: Secondary | ICD-10-CM

## 2024-05-05 DIAGNOSIS — I10 Essential (primary) hypertension: Secondary | ICD-10-CM | POA: Diagnosis not present

## 2024-05-05 DIAGNOSIS — R202 Paresthesia of skin: Secondary | ICD-10-CM | POA: Diagnosis not present

## 2024-05-05 DIAGNOSIS — J449 Chronic obstructive pulmonary disease, unspecified: Secondary | ICD-10-CM | POA: Diagnosis not present
